# Patient Record
Sex: Female | Born: 1950 | Race: White | Hispanic: No | Marital: Married | Smoking: Current every day smoker
Health system: Southern US, Community
[De-identification: ages and names within clinical notes are randomized; demographics above are authoritative.]

## PROBLEM LIST (undated history)

## (undated) DIAGNOSIS — M1990 Unspecified osteoarthritis, unspecified site: Secondary | ICD-10-CM

## (undated) DIAGNOSIS — E119 Type 2 diabetes mellitus without complications: Secondary | ICD-10-CM

## (undated) DIAGNOSIS — K219 Gastro-esophageal reflux disease without esophagitis: Secondary | ICD-10-CM

## (undated) DIAGNOSIS — C801 Malignant (primary) neoplasm, unspecified: Secondary | ICD-10-CM

## (undated) DIAGNOSIS — R011 Cardiac murmur, unspecified: Secondary | ICD-10-CM

## (undated) DIAGNOSIS — I10 Essential (primary) hypertension: Secondary | ICD-10-CM

## (undated) DIAGNOSIS — Z87440 Personal history of urinary (tract) infections: Secondary | ICD-10-CM

## (undated) DIAGNOSIS — J189 Pneumonia, unspecified organism: Secondary | ICD-10-CM

## (undated) DIAGNOSIS — K759 Inflammatory liver disease, unspecified: Secondary | ICD-10-CM

## (undated) HISTORY — PX: TOTAL HIP ARTHROPLASTY: SHX124

## (undated) HISTORY — DX: Essential (primary) hypertension: I10

## (undated) HISTORY — PX: COLON SURGERY: SHX602

---

## 1995-01-29 ENCOUNTER — Encounter: Payer: Self-pay | Admitting: Internal Medicine

## 2003-05-29 ENCOUNTER — Emergency Department (HOSPITAL_COMMUNITY): Admission: AD | Admit: 2003-05-29 | Discharge: 2003-05-29 | Payer: Self-pay | Admitting: Emergency Medicine

## 2003-05-29 ENCOUNTER — Encounter: Payer: Self-pay | Admitting: Emergency Medicine

## 2003-11-26 ENCOUNTER — Encounter: Admission: RE | Admit: 2003-11-26 | Discharge: 2003-11-26 | Payer: Self-pay | Admitting: Family Medicine

## 2003-12-19 ENCOUNTER — Encounter: Payer: Self-pay | Admitting: Nurse Practitioner

## 2004-05-30 ENCOUNTER — Encounter: Admission: RE | Admit: 2004-05-30 | Discharge: 2004-05-30 | Payer: Self-pay | Admitting: Family Medicine

## 2004-06-17 ENCOUNTER — Ambulatory Visit (HOSPITAL_COMMUNITY): Admission: RE | Admit: 2004-06-17 | Discharge: 2004-06-17 | Payer: Self-pay | Admitting: General Surgery

## 2004-06-25 ENCOUNTER — Encounter: Admission: RE | Admit: 2004-06-25 | Discharge: 2004-06-25 | Payer: Self-pay | Admitting: Family Medicine

## 2004-06-27 ENCOUNTER — Encounter: Admission: RE | Admit: 2004-06-27 | Discharge: 2004-06-27 | Payer: Self-pay | Admitting: Family Medicine

## 2004-08-09 ENCOUNTER — Emergency Department (HOSPITAL_COMMUNITY): Admission: EM | Admit: 2004-08-09 | Discharge: 2004-08-09 | Payer: Self-pay | Admitting: Podiatry

## 2004-08-12 ENCOUNTER — Encounter: Payer: Self-pay | Admitting: Nurse Practitioner

## 2004-08-13 ENCOUNTER — Encounter: Admission: RE | Admit: 2004-08-13 | Discharge: 2004-08-13 | Payer: Self-pay | Admitting: Orthopedic Surgery

## 2004-10-21 ENCOUNTER — Inpatient Hospital Stay (HOSPITAL_COMMUNITY): Admission: RE | Admit: 2004-10-21 | Discharge: 2004-10-25 | Payer: Self-pay | Admitting: Orthopedic Surgery

## 2004-12-29 ENCOUNTER — Encounter: Admission: RE | Admit: 2004-12-29 | Discharge: 2004-12-29 | Payer: Self-pay | Admitting: Family Medicine

## 2005-05-18 ENCOUNTER — Ambulatory Visit: Payer: Self-pay | Admitting: Internal Medicine

## 2005-07-17 ENCOUNTER — Ambulatory Visit: Payer: Self-pay | Admitting: Internal Medicine

## 2005-07-23 ENCOUNTER — Ambulatory Visit: Payer: Self-pay | Admitting: Internal Medicine

## 2005-08-11 ENCOUNTER — Ambulatory Visit: Payer: Self-pay | Admitting: Internal Medicine

## 2005-08-13 ENCOUNTER — Ambulatory Visit: Payer: Self-pay | Admitting: Internal Medicine

## 2005-08-21 ENCOUNTER — Ambulatory Visit: Payer: Self-pay | Admitting: Internal Medicine

## 2005-10-05 ENCOUNTER — Ambulatory Visit: Payer: Self-pay | Admitting: Internal Medicine

## 2005-11-04 ENCOUNTER — Ambulatory Visit: Payer: Self-pay | Admitting: Internal Medicine

## 2005-11-30 ENCOUNTER — Ambulatory Visit: Payer: Self-pay | Admitting: Internal Medicine

## 2006-07-22 ENCOUNTER — Encounter: Admission: RE | Admit: 2006-07-22 | Discharge: 2006-07-22 | Payer: Self-pay | Admitting: Orthopedic Surgery

## 2006-08-18 ENCOUNTER — Encounter: Admission: RE | Admit: 2006-08-18 | Discharge: 2006-08-18 | Payer: Self-pay | Admitting: Orthopedic Surgery

## 2006-12-17 ENCOUNTER — Ambulatory Visit: Payer: Self-pay | Admitting: Internal Medicine

## 2007-01-04 ENCOUNTER — Ambulatory Visit (HOSPITAL_COMMUNITY): Admission: RE | Admit: 2007-01-04 | Discharge: 2007-01-04 | Payer: Self-pay | Admitting: Internal Medicine

## 2007-01-04 ENCOUNTER — Encounter: Payer: Self-pay | Admitting: Internal Medicine

## 2007-02-07 ENCOUNTER — Ambulatory Visit: Payer: Self-pay | Admitting: Internal Medicine

## 2007-10-11 ENCOUNTER — Other Ambulatory Visit: Admission: RE | Admit: 2007-10-11 | Discharge: 2007-10-11 | Payer: Self-pay | Admitting: Family Medicine

## 2007-10-28 DIAGNOSIS — K589 Irritable bowel syndrome without diarrhea: Secondary | ICD-10-CM

## 2007-10-28 DIAGNOSIS — M129 Arthropathy, unspecified: Secondary | ICD-10-CM | POA: Insufficient documentation

## 2007-10-28 DIAGNOSIS — M1990 Unspecified osteoarthritis, unspecified site: Secondary | ICD-10-CM | POA: Insufficient documentation

## 2007-10-28 DIAGNOSIS — E785 Hyperlipidemia, unspecified: Secondary | ICD-10-CM | POA: Insufficient documentation

## 2007-10-28 DIAGNOSIS — Z860100 Personal history of colon polyps, unspecified: Secondary | ICD-10-CM | POA: Insufficient documentation

## 2007-10-28 DIAGNOSIS — K219 Gastro-esophageal reflux disease without esophagitis: Secondary | ICD-10-CM | POA: Insufficient documentation

## 2007-10-28 DIAGNOSIS — K449 Diaphragmatic hernia without obstruction or gangrene: Secondary | ICD-10-CM | POA: Insufficient documentation

## 2007-12-16 ENCOUNTER — Encounter: Admission: RE | Admit: 2007-12-16 | Discharge: 2007-12-16 | Payer: Self-pay | Admitting: Family Medicine

## 2009-05-10 ENCOUNTER — Emergency Department (HOSPITAL_COMMUNITY): Admission: EM | Admit: 2009-05-10 | Discharge: 2009-05-10 | Payer: Self-pay | Admitting: Emergency Medicine

## 2010-01-20 ENCOUNTER — Telehealth: Payer: Self-pay | Admitting: Internal Medicine

## 2010-01-21 ENCOUNTER — Ambulatory Visit: Payer: Self-pay | Admitting: Internal Medicine

## 2010-01-21 DIAGNOSIS — R1032 Left lower quadrant pain: Secondary | ICD-10-CM | POA: Insufficient documentation

## 2010-01-21 DIAGNOSIS — R109 Unspecified abdominal pain: Secondary | ICD-10-CM | POA: Insufficient documentation

## 2010-01-21 DIAGNOSIS — Z87448 Personal history of other diseases of urinary system: Secondary | ICD-10-CM | POA: Insufficient documentation

## 2010-01-21 DIAGNOSIS — R198 Other specified symptoms and signs involving the digestive system and abdomen: Secondary | ICD-10-CM | POA: Insufficient documentation

## 2010-01-21 LAB — CONVERTED CEMR LAB
Basophils Absolute: 0 10*3/uL (ref 0.0–0.1)
Calcium: 9 mg/dL (ref 8.4–10.5)
Eosinophils Absolute: 0.1 10*3/uL (ref 0.0–0.7)
GFR calc non Af Amer: 135.39 mL/min (ref >60)
HCT: 39.7 % (ref 36.0–46.0)
Lymphs Abs: 2.8 10*3/uL (ref 0.7–4.0)
MCV: 89.6 fL (ref 78.0–100.0)
Monocytes Absolute: 1.2 10*3/uL — ABNORMAL HIGH (ref 0.1–1.0)
Platelets: 303 10*3/uL (ref 150.0–400.0)
RDW: 12.6 % (ref 11.5–14.6)
Sodium: 140 meq/L (ref 135–145)

## 2010-01-22 ENCOUNTER — Ambulatory Visit: Payer: Self-pay | Admitting: Cardiology

## 2010-01-22 ENCOUNTER — Telehealth: Payer: Self-pay | Admitting: Nurse Practitioner

## 2010-01-23 ENCOUNTER — Telehealth: Payer: Self-pay | Admitting: Nurse Practitioner

## 2010-01-23 ENCOUNTER — Inpatient Hospital Stay (HOSPITAL_COMMUNITY): Admission: EM | Admit: 2010-01-23 | Discharge: 2010-01-26 | Payer: Self-pay | Admitting: Gastroenterology

## 2010-01-24 ENCOUNTER — Encounter: Payer: Self-pay | Admitting: Gastroenterology

## 2010-01-25 ENCOUNTER — Ambulatory Visit: Payer: Self-pay | Admitting: Gastroenterology

## 2010-01-27 ENCOUNTER — Telehealth: Payer: Self-pay | Admitting: Internal Medicine

## 2010-01-27 ENCOUNTER — Encounter: Payer: Self-pay | Admitting: Internal Medicine

## 2010-01-27 DIAGNOSIS — K5732 Diverticulitis of large intestine without perforation or abscess without bleeding: Secondary | ICD-10-CM

## 2010-01-30 ENCOUNTER — Ambulatory Visit: Payer: Self-pay | Admitting: Internal Medicine

## 2010-01-30 ENCOUNTER — Ambulatory Visit (HOSPITAL_COMMUNITY): Admission: RE | Admit: 2010-01-30 | Discharge: 2010-01-30 | Payer: Self-pay | Admitting: Internal Medicine

## 2010-01-30 ENCOUNTER — Encounter (INDEPENDENT_AMBULATORY_CARE_PROVIDER_SITE_OTHER): Payer: Self-pay | Admitting: *Deleted

## 2010-01-30 ENCOUNTER — Inpatient Hospital Stay (HOSPITAL_COMMUNITY): Admission: AD | Admit: 2010-01-30 | Discharge: 2010-02-05 | Payer: Self-pay | Admitting: Gastroenterology

## 2010-01-30 DIAGNOSIS — K5730 Diverticulosis of large intestine without perforation or abscess without bleeding: Secondary | ICD-10-CM | POA: Insufficient documentation

## 2010-01-30 DIAGNOSIS — I10 Essential (primary) hypertension: Secondary | ICD-10-CM | POA: Insufficient documentation

## 2010-02-02 ENCOUNTER — Encounter (INDEPENDENT_AMBULATORY_CARE_PROVIDER_SITE_OTHER): Payer: Self-pay | Admitting: *Deleted

## 2010-02-05 ENCOUNTER — Telehealth (INDEPENDENT_AMBULATORY_CARE_PROVIDER_SITE_OTHER): Payer: Self-pay | Admitting: *Deleted

## 2010-02-12 ENCOUNTER — Encounter: Payer: Self-pay | Admitting: Physician Assistant

## 2010-02-12 ENCOUNTER — Telehealth: Payer: Self-pay | Admitting: Physician Assistant

## 2010-02-19 ENCOUNTER — Ambulatory Visit: Payer: Self-pay | Admitting: Internal Medicine

## 2010-02-19 ENCOUNTER — Ambulatory Visit: Payer: Self-pay | Admitting: Cardiovascular Disease

## 2010-02-20 ENCOUNTER — Telehealth (INDEPENDENT_AMBULATORY_CARE_PROVIDER_SITE_OTHER): Payer: Self-pay | Admitting: *Deleted

## 2010-02-25 ENCOUNTER — Ambulatory Visit (HOSPITAL_BASED_OUTPATIENT_CLINIC_OR_DEPARTMENT_OTHER): Admission: RE | Admit: 2010-02-25 | Discharge: 2010-02-25 | Payer: Self-pay | Admitting: Urology

## 2010-02-28 ENCOUNTER — Encounter: Payer: Self-pay | Admitting: Internal Medicine

## 2010-03-03 ENCOUNTER — Telehealth: Payer: Self-pay | Admitting: Internal Medicine

## 2010-03-11 ENCOUNTER — Encounter: Payer: Self-pay | Admitting: Internal Medicine

## 2010-03-11 ENCOUNTER — Telehealth: Payer: Self-pay | Admitting: Internal Medicine

## 2010-03-18 ENCOUNTER — Encounter: Admission: RE | Admit: 2010-03-18 | Discharge: 2010-03-18 | Payer: Self-pay | Admitting: Surgery

## 2010-04-07 ENCOUNTER — Inpatient Hospital Stay (HOSPITAL_COMMUNITY): Admission: RE | Admit: 2010-04-07 | Discharge: 2010-04-14 | Payer: Self-pay | Admitting: Surgery

## 2010-04-07 ENCOUNTER — Encounter (INDEPENDENT_AMBULATORY_CARE_PROVIDER_SITE_OTHER): Payer: Self-pay | Admitting: Surgery

## 2010-05-12 ENCOUNTER — Encounter: Payer: Self-pay | Admitting: Internal Medicine

## 2010-05-30 ENCOUNTER — Encounter: Payer: Self-pay | Admitting: Internal Medicine

## 2010-06-06 ENCOUNTER — Emergency Department (HOSPITAL_COMMUNITY): Admission: EM | Admit: 2010-06-06 | Discharge: 2010-06-06 | Payer: Self-pay | Admitting: Emergency Medicine

## 2010-12-14 ENCOUNTER — Encounter: Payer: Self-pay | Admitting: Family Medicine

## 2010-12-23 NOTE — Miscellaneous (Signed)
Summary: med. orders  Clinical Lists Changes  Medications: Added new medication of AUGMENTIN 875-125 MG TABS (AMOXICILLIN-POT CLAVULANATE) Take 1 p.o.b.i.d. x14 days - Signed Added new medication of METRONIDAZOLE 500 MG TABS (METRONIDAZOLE) Take 1 p.o. two times a day x 2 weeks-use what you have on hand first(7) - Signed Rx of AUGMENTIN 875-125 MG TABS (AMOXICILLIN-POT CLAVULANATE) Take 1 p.o.b.i.d. x14 days;  #28 x 0;  Signed;  Entered by: Teryl Lucy RN;  Authorized by: Sammuel Cooper PA-c;  Method used: Electronically to [facility] [phone]*, [address] [place], [place]  [address], Ph: [phone], Fax: [phone] Rx of METRONIDAZOLE 500 MG TABS (METRONIDAZOLE) Take 1 p.o. two times a day x 2 weeks-use what you have on hand first(7);  #21 x 0;  Signed;  Entered by: Teryl Lucy RN;  Authorized by: Sammuel Cooper PA-c;  Method used: Electronically to [facility] [phone]*, [address], [place], [place]  [address], Ph: [phone], Fax: [phone]    Prescriptions: METRONIDAZOLE 500 MG TABS (METRONIDAZOLE) Take 1 p.o. two times a day x 2 weeks-use what you have on hand first(7)  #21 x 0   Entered by:   Teryl Lucy RN   Authorized by:   Sammuel Cooper PA-c   Signed by:   Teryl Lucy RN on 02/12/2010   Method used:   Electronically to        [facility] [phone]* (retail)       [address] [place], [place]  [address]       Ph: [phone]       Fax: [phone]   RxID:   [id number] AUGMENTIN 875-125 MG TABS (AMOXICILLIN-POT CLAVULANATE) Take 1 p.o.b.i.d. x14 days  #28 x 0   Entered by:   Teryl Lucy RN   Authorized by:   Sammuel Cooper PA-c   Signed by:   Teryl Lucy RN on 02/12/2010   Method used:   Electronically to        [facility] [phone]* (retail)       [address] [place], [place]  [address]       Ph: [phone]       Fax: [phone]   RxID:   [id number]

## 2010-12-23 NOTE — Consult Note (Signed)
[facility]   Imported By: Sherian Rein 02/13/2010 08:09:27  _____________________________________________________________________  External Attachment:    Type:   Image     Comment:   External Document

## 2010-12-23 NOTE — Letter (Signed)
Summary: [employer] Certification Notification   Imported By: Roderic Ovens 04/23/2010 14:41:18  _____________________________________________________________________  External Attachment:    Type:   Image     Comment:   External Document

## 2010-12-23 NOTE — Procedures (Signed)
Summary: [facility] EGD   EGD  Procedure date:  01/04/2007  Findings:      Location: [facility]   Patient Name: Jane [name], Jane [name] MRN: [id number] Procedure Procedures: Panendoscopy (EGD) CPT: 43235.    with esophageal dilation. CPT: G9296129.  Personnel: Endoscopist: Wilhemina Bonito. Marina Goodell, MD.  Referred By: Henrine Screws, MD.  Exam Location: Exam performed in Endoscopy Suite.  Patient Consent: Procedure, Alternatives, Risks and Benefits discussed, consent obtained, from patient. Consent was obtained by the RN.  Indications  Therapeutics: Reason for exam: Esophageal dilation.  Symptoms: Dysphagia. Reflux symptoms  History  Current Medications: Patient is not currently taking Coumadin.  Pre-Exam Physical: Performed Jan 04, 2007  Cardio-pulmonary exam, HEENT exam, Abdominal exam, Mental status exam WNL.  Comments: Pt. history reviewed/updated, physical exam performed prior to initiation of sedation?yes Exam Exam Info: Maximum depth of insertion Duodenum, intended Duodenum. Patient position: on left side. Vocal cords visualized. Gastric retroflexion performed. Images taken. ASA Classification: II. Tolerance: excellent.  Sedation Meds: Patient assessed and found to be appropriate for moderate (conscious) sedation. Fentanyl 100 mcg. given IV. Versed 8 mg. given IV. Cetacaine Spray 2 sprays given aerosolized.  Monitoring: BP and pulse monitoring done. Oximetry used. Supplemental O2 given  Fluoroscopy: Fluoroscopy was used.  Findings STRICTURE / STENOSIS: Stricture in Distal Esophagus.  Constriction: partial. Etiology: benign due to reflux. 37 cm from mouth. Lumen diameter is 15 mm. ICD9: Esophageal Stricture: 530.3. Comment: No Barrett's or Active esophagitis.  - Dilation: Distal Esophagus. Procedure was performed under Fluoroscopy. Wire Guided/Savary (Wilson-Cook) dilator used, Diameter: 18 mm, No Resistance, No Heme present on extraction. 1  total dilators used. Patient  tolerance excellent.    Comments: Small H/H . Otherwise normal EGD to D3 Assessment  Diagnoses: 530.81: GERD.  530.3: Esophageal Stricture. s/p dilation.   Events  Unplanned Intervention: No unplanned interventions were required.  Unplanned Events: There were no complications. Plans Instructions: Nothing to eat or drink for 2 hrs.  Clear or full liquids: 2 hrs. Resume previous diet: am.  Medication(s): Continue current medications.  Patient Education: Patient given standard instructions for: Reflux.  Disposition: After procedure patient sent to recovery. After recovery patient sent home.  Scheduling: Follow-up prn.   cc:  Henrine Screws, MD  This report was created from the original endoscopy report, which was reviewed and signed by the above listed endoscopist.

## 2010-12-23 NOTE — Progress Notes (Signed)
Summary: CT ABD/PELVIS CALL REPORT  Phone Note From Other Clinic   Caller: [facility] Call For: Willette Cluster, NP Summary of Call: Call Report for patient CT abd/pelvis with contrast completed today: Edema and ill defined fluid within the sigmoid mesocolon with adjacent colonic wall thickening. Findings likely relate to moderated diverticulitis with phlegmon. No drainable abcess. #2 Moderate left hydroureteronephrosis secondary to likely inflammatory process. Given the ureteric obstrution, colon carcinoma should also be considered but is felt to be less likely. Initial call taken by: Hortense Ramal CMA Duncan Dull),  January 22, 2010 3:27 PM  Follow-up for Phone Call        I have called and spoken to Willette Cluster, NP and advised her of the report just recieved. She has already given the patient cipro and flagyl emperically. However, she would like [place] to call patient and find out how she is feeling.  Follow-up by: Hortense Ramal CMA Duncan Dull),  January 22, 2010 3:32 PM  Additional Follow-up for Phone Call Additional follow up Details #1::        Patient states that she is still having left sided abdominal discomfort which has really not changed in intesity and complains that she still has not really had a bowel movement. She states that she has had no recorded fever but has had some chills.There has been no nausea or vomiting. She has began taking her cipro and flagyl as written. I have advised Willette Cluster, NP of the above conversation and she will talk to the patient first thing tommorrow morning (at her work number) to see how she is feeling at that time. Additional Follow-up by: Hortense Ramal CMA Duncan Dull),  January 22, 2010 3:42 PM    Additional Follow-up for Phone Call Additional follow up Details #2::    Called patient at 9:00am, she was at work.  Her stabbing pain has subsided but still very tender in LLQ.  No shaking chills. She is tolerating clears. Will work her in to see Dr. Marina Goodell  tomorrow, or early next week. In meantime, she will call us ASAP for severe pain, fevers, chills.  Follow-up by: Willette Cluster NP,  January 23, 2010 9:22 AM

## 2010-12-23 NOTE — Progress Notes (Signed)
  Phone Note Call from Patient   Caller: Patient Call For: Jane [name], CMA Summary of Call: She was feeling better this morning but called back this afternoon with recurrent LLQ pain, severe. She will be admitted to the hospital for inpatient management of diverticulitis. I spoke with Dr. Marina Goodell, patient's primary gastroenterologist, patient will be admitted. Hospitalist will admit patient but we will be following.  Initial call taken by: Willette Cluster NP,  January 23, 2010 6:23 PM

## 2010-12-23 NOTE — Progress Notes (Signed)
Summary: Follow up  Phone Note Call from Patient   Summary of Call: Pt. called to say DrBborden has scheduled her to put a stent on 02/25/10. Dr.Martin is out of town all week so they will address sooner appt. with him when he returns.Current appt. is 4/8. Initial call taken by: Teryl Lucy RN,  February 20, 2010 12:14 PM  Follow-up for Phone Call        ok. thanks for the follow up Follow-up by: Hilarie Fredrickson MD,  February 20, 2010 12:19 PM

## 2010-12-23 NOTE — Letter (Signed)
Summary: [facility]   Imported By: Sherian Rein 03/17/2010 07:59:18  _____________________________________________________________________  External Attachment:    Type:   Image     Comment:   External Document

## 2010-12-23 NOTE — Progress Notes (Signed)
Summary: Needs appt for today  Phone Note Call from Patient Call back at Home Phone [phone]   Call For: Dr Marina Goodell Reason for Call: Talk to Nurse Summary of Call: Needs to get in today. Was discharged from hospital yesterday. Initial call taken by: Leanor Kail [facility],  January 27, 2010 8:50 AM  Follow-up for Phone Call        Pt. discharged from hospital yesterday with diverticulitis Says.Dr. did not want to discharge her but she insisted because she couldn't rest there. Asking for f/u appt. for today as she said she promised the dr. she would call as he said she probably will need f/u CT on Wed. or Thursday. Follow-up by: Teryl Lucy RN,  January 27, 2010 9:08 AM  Additional Follow-up for Phone Call Additional follow up Details #1::        have her see Amy today. I am supervising Additional Follow-up by: Hilarie Fredrickson MD,  January 27, 2010 9:12 AM    Additional Follow-up for Phone Call Additional follow up Details #2::     Given appt. with Amy for 2 pm today. Follow-up by: Teryl Lucy RN,  January 27, 2010 9:19 AM   Appended Document: Needs appt for today I SPOKE WITH Amaira,SHE IS FEELING A LITTLE BETTER EACH DAY,GRADUALLY ADVANCING HER DIET,USING PAIN MEDICINE ONCE OR TWICE DAILY."PAIN IS A WHOLE LOT BETTER THAN IT WAS". SHE DOES NOT NEED OFFICE VISIT TODAY, WILL SCHEDULE FOR FOLLOW UP CT ON 3/10 AND OFFICE VISIT WITH AMY THAT AFTERNOON. SHE KNOWS TO CALL IN THE INTERIM FOR PROBLEMS.  Appended Document: Needs appt for today ok

## 2010-12-23 NOTE — Letter (Signed)
Summary: [facility]  [facility]   Imported By: Lester Van Zandt 01/28/2010 10:34:58  _____________________________________________________________________  External Attachment:    Type:   Image     Comment:   External Document

## 2010-12-23 NOTE — Letter (Signed)
Summary: authorization  authorization   Imported By: Kassie Mends 02/21/2010 08:45:56  _____________________________________________________________________  External Attachment:    Type:   Image     Comment:   External Document

## 2010-12-23 NOTE — Procedures (Signed)
Summary: Lajean Silvius Buccini MD  Colon/Robert V Buccini MD   Imported By: Lester Batesland 01/28/2010 10:32:14  _____________________________________________________________________  External Attachment:    Type:   Image     Comment:   External Document

## 2010-12-23 NOTE — Letter (Signed)
Summary: [facility]   Imported By: Sherian Rein 06/11/2010 13:23:15  _____________________________________________________________________  External Attachment:    Type:   Image     Comment:   External Document

## 2010-12-23 NOTE — Progress Notes (Signed)
Summary: FMLA paperwork for completion  FMLA paperwork for completion. Forwarded to [employer]. Dena Chavis  February 05, 2010 9:14 AM

## 2010-12-23 NOTE — Assessment & Plan Note (Signed)
Summary: Post hospital - Diverticulitis   History of Present Illness Visit Type: Follow-up Visit Primary GI MD: Yancey Flemings MD Primary Provider: Lanell Persons, MD  Requesting Provider: n/a Chief Complaint: Follow up for abdominal pain, pt still c/o abdominal pain taking Cipro was prescribed by Gunnar Fusi when pt was in hospital History of Present Illness:   60 year old female with hypertension, hyperlipidemia, obesity, osteoarthritis, and gastroesophageal reflux disease complicated by peptic stricture. She has been followed recently for complicated diverticulitis. Her history dates back approximately 5 weeks when she developed lower abdominal pain. She was seen about one week later and diagnosed with complicated diverticulitis as evidenced by the CT scan revealing sigmoid phlegmon with left-sided ureteral obstruction. She was treated as an outpatient with antibiotics and pain medication, but due to increasing pain, had a short hospital stay. She was subsequently rehospitalized March 10-16, 2011 with ongoing pain and an unchanged CT scan. She was seen in consultation by urology as well as general surgery. After one week of IV antibiotics she was feeling somewhat better and was discharged home. Due to medication confusion, based on discharge instructions sheet, she was on no antibiotic for about one week. However one week ago she was placed on Augmentin and Flagyl. She complained of diarrhea this past weekend and Cipro was exchanged for Augmentin. She continues the medications at this time. She states that she is no better. She still has significant pain. She is due to see the urologist this afternoon. She did undergo complete colonoscopy with Dr. Matthias Hughs in 2005. This revealed left-sided diverticulosis and diminutive polyps which were removed and found to be hyperplastic.   GI Review of Systems    Reports abdominal pain, bloating, and  nausea.     Location of  Abdominal pain: LLQ.    Denies acid reflux,  belching, chest pain, dysphagia with liquids, dysphagia with solids, heartburn, loss of appetite, vomiting, vomiting blood, weight loss, and  weight gain.        Denies anal fissure, black tarry stools, change in bowel habit, constipation, diarrhea, diverticulosis, fecal incontinence, heme positive stool, hemorrhoids, irritable bowel syndrome, jaundice, light color stool, liver problems, rectal bleeding, and  rectal pain.    Current Medications (verified): 1)  Prilosec 20 Mg  Cpdr (Omeprazole) .... One Tablet By Mouth Once Daily 2)  Tylenol Extra Strength 500 Mg Tabs (Acetaminophen) .... Two Tablets By Mouth Once Daily 3)  Hydrocodone-Acetaminophen 5-325 Mg Tabs (Hydrocodone-Acetaminophen) .... Take 1 Tablet By Mouth Every 6 Hours For Pain 4)  Metronidazole 500 Mg Tabs (Metronidazole) .... Take 1 P.o. Two Times A Day X 2 Weeks-Use What You Have On Hand First(7) 5)  Florastor 250 Mg Caps (Saccharomyces Boulardii) .Marland Kitchen.. 1 By Mouth Two Times A Day  Allergies (verified): 1)  ! Codeine  Past History:  Past Medical History: Last updated: 02/14/2010 Colonic polyps, hx of DIVERTICULITIS 3/11-HOSPITALIZED Hepatitis A, hx of Smoker Hyperlipidemia Osteoarthritis Diabetes: Controlled by diet and weight loss  hyperplastic polyp 2005  Past Surgical History: Last updated: 10/28/2007 Appendectomy-1976 Caesarean section x 12-1970 & 1973 Hip replacement-09/2004 [id number]  Family History: Last updated: 01/21/2010 No FH of Colon Cancer: Stomach Cancer: PGM Family History of Heart Disease: Paternal Uncles   Social History: Last updated: 01/21/2010 Occupation: Sales Married 2 childern Patient currently smokes.  Alcohol Use - yes:3-4 a week  Daily Caffeine Use: 2 daily  Illicit Drug Use - no  Review of Systems       The patient complains of fatigue.  The patient  denies allergy/sinus, anemia, anxiety-new, arthritis/joint pain, back pain, blood in urine, breast changes/lumps, change in  vision, confusion, cough, coughing up blood, depression-new, fainting, fever, headaches-new, hearing problems, heart murmur, heart rhythm changes, itching, menstrual pain, muscle pains/cramps, night sweats, nosebleeds, pregnancy symptoms, shortness of breath, skin rash, sleeping problems, sore throat, swelling of feet/legs, swollen lymph glands, thirst - excessive , urination - excessive , urination changes/pain, urine leakage, vision changes, and voice change.    Vital Signs:  Patient profile:   60 year old female Height:      62 inches Weight:      166 pounds BMI:     30.47 BSA:     1.77 Pulse rate:   80 / minute Pulse rhythm:   regular BP sitting:   120 / 80  (left arm)  Vitals Entered By: Merri Ray CMA Duncan Dull) (February 19, 2010 10:54 AM)  Physical Exam  General:  Well developed, well nourished, no acute distress. Head:  Normocephalic and atraumatic. Eyes:  PERRLA, no icterus. Ears:  Normal auditory acuity. Nose:  No deformity, discharge,  or lesions. Mouth:  No deformity or lesions, dentition normal. Neck:  Supple; no masses or thyromegaly. Lungs:  Clear throughout to auscultation. Heart:  Regular rate and rhythm; no murmurs, rubs,  or bruits. Abdomen:  obese and soft with good bowel sounds. There is tenderness with rebound in the left lower quadrant. No obvious mass. No hernia. Msk:  Symmetrical with no gross deformities. Normal posture. Pulses:  Normal pulses noted. Extremities:  No clubbing, cyanosis, edema or deformities noted. Neurologic:  Alert and  oriented x4;  grossly normal neurologically. Skin:  Intact without significant lesions or rashes. Psych:  Alert and cooperative. Normal mood and affect.   Impression & Recommendations:  Problem # 1:  DIVERTICULITIS OF COLON (ICD-562.11) Complicated and persisting diverticulitis of the sigmoid colon with associated phlegmon and left ureteral obstruction. She continues with significant symptoms and an abnormal physical  exam despite ongoing antibiotics (though there was a one week interruption as described). The patient had multiple questions regarding diverticular disease, or problems with diverticulitis, and possible therapies moving forward. We spent about 30 minutes discussing these issues.  Plan: #1. Refill ciprofloxacin 500 mg p.o. b.i.d. x2 weeks #2. Refill metronidazole 500 mg p.o. b.i.d. x2 weeks #3. Refill hydrocodone #4. Contrast-enhanced CT scan of the abdomen and pelvis later today #5. Keep urology appointment today. She may need a ureteral stent #6. She has a scheduled surgical appointment for April 8. We may need to adjust this pending upon the results of the CT scan. #7. Low-residue diet  Problem # 2:  GERD (ICD-530.81) asymptomatic on Prilosec. No recurrent dysphagia. Recommended 2 continue Prilosec. Repeat esophageal dilation p.r.n. recurrent dysphagia.  Other Orders: CT Abdomen/Pelvis w & w/o Contrast (CT A/P w&w/o Cont)  Patient Instructions: 1)  CT Abdomen/Pelvis with and w/o contrast [place] CT today 2:00 pm.   2)  Contrast given to patient in office with instructions and times to drink. 3)  Rx. for Cipro, Metronidazole and Hydrocodone sent to pharmacy for patient to pick up. 4)  The medication list was reviewed and reconciled.  All changed / newly prescribed medications were explained.  A complete medication list was provided to the patient / caregiver. 5)  Patient was not given a copy due to her not being able to wait.  Had another appt. [facility]  February 19, 2010 11:43 AM 6)  Copy: Dr. Crecencio Mc, Dr. Luretha Murphy, Dr. Henrine Screws  Prescriptions: CIPRO 500 MG TABS (CIPROFLOXACIN HCL) 1 tablet by mouth two times a day  #28 x 0   Entered by:   Milford Cage NCMA   Authorized by:   Hilarie Fredrickson MD   Signed by:   Milford Cage NCMA on 02/19/2010   Method used:   Printed then faxed to ...       [address] [phone]* (retail)       [address] [place],  [place]  [address]       Ph: [phone]       Fax: [phone]   RxID:   [id number] HYDROCODONE-ACETAMINOPHEN 5-325 MG TABS (HYDROCODONE-ACETAMINOPHEN) Take 1 tablet by mouth every 6 hours for pain  #60 x 0   Entered by:   Milford Cage NCMA   Authorized by:   Hilarie Fredrickson MD   Signed by:   Milford Cage NCMA on 02/19/2010   Method used:   Printed then faxed to ...       [address] [phone]* (retail)       [address] [place], [place]  [address]       Ph: [phone]       Fax: [phone]   RxID:   [id number] METRONIDAZOLE 500 MG TABS (METRONIDAZOLE) Take 1 p.o. two times a day x 2 weeks-use what you have on hand first(7)  #21 x 0   Entered by:   Milford Cage NCMA   Authorized by:   Hilarie Fredrickson MD   Signed by:   Milford Cage NCMA on 02/19/2010   Method used:   Electronically to        [facility] [phone]* (retail)       [address] [place], [place]  [address]       Ph: [phone]       Fax: [phone]   RxID:   [id number]

## 2010-12-23 NOTE — Letter (Signed)
Summary: [facility]   Imported By: Lester Tynan 05/21/2010 10:24:54  _____________________________________________________________________  External Attachment:    Type:   Image     Comment:   External Document

## 2010-12-23 NOTE — Miscellaneous (Signed)
Summary: CT scan  Clinical Lists Changes  Problems: Added new problem of DIVERTICULITIS OF COLON (ICD-562.11) Orders: Added new Referral order of CT Abdomen/Pelvis with Contrast (CT Abd/Pelvis w/con) - Signed

## 2010-12-23 NOTE — Progress Notes (Signed)
Summary: Triage  Phone Note Call from Patient Call back at Work Phone [phone]   Caller: Patient Call For: Dr. Marina Goodell Reason for Call: Talk to Nurse Summary of Call: Pt went to her MD over the weekend. Having alot of "gas pains" and wants a sooner appt. than 02-27-10 Initial call taken by: Karna Christmas,  January 20, 2010 8:45 AM  Follow-up for Phone Call          Message left for pt. to call back   Teryl Lucy RN  January 20, 2010 11:59 AM  Pt. started having gas like abd. pain  last Wednesday.Her wt. loss physician told her to take Miralax q.2 hrs.x 24 hrs. over the week. end which she did. Bowels are moving but in small amts. and abd. discomfort persists.Given appt. with NP for tomorrow. Follow-up by: Teryl Lucy RN,  January 20, 2010 2:33 PM

## 2010-12-23 NOTE — Miscellaneous (Signed)
Summary: Vicodin order  Clinical Lists Changes  Medications: Added new medication of VICODIN 5-500 MG TABS (HYDROCODONE-ACETAMINOPHEN) Take 1-2 every 4-6 hrs. as needed for pain - Signed Rx of VICODIN 5-500 MG TABS (HYDROCODONE-ACETAMINOPHEN) Take 1-2 every 4-6 hrs. as needed for pain;  #40 x 0;  Signed;  Entered by: Teryl Lucy RN;  Authorized by: Hilarie Fredrickson MD;  Method used: Printed then faxed to [facility] [address] [place], [place]  [address], Ph: [phone], Fax: [phone]    Prescriptions: VICODIN 5-500 MG TABS (HYDROCODONE-ACETAMINOPHEN) Take 1-2 every 4-6 hrs. as needed for pain  #40 x 0   Entered by:   Teryl Lucy RN   Authorized by:   Hilarie Fredrickson MD   Signed by:   Teryl Lucy RN on 03/11/2010   Method used:   Printed then faxed to ...       [address] [phone]* (retail)       [address] [place], [place]  [address]       Ph: [phone]       Fax: [phone]   RxID:   [id number]

## 2010-12-23 NOTE — Letter (Signed)
[facility]   Imported By: Sherian Rein 03/05/2010 10:11:12  _____________________________________________________________________  External Attachment:    Type:   Image     Comment:   External Document

## 2010-12-23 NOTE — Progress Notes (Signed)
Summary: Question about antibiotics.  Phone Note Call from Patient Call back at Home Phone [phone]   Call For: Dr Marina Goodell Reason for Call: Talk to Nurse Summary of Call: Should she continue the antibiotics she was taking prior to being admitted in the hospital Initial call taken by: Leanor Kail [facility],  February 12, 2010 9:16 AM  Follow-up for Phone Call        Ssays her discharge instructions does not say to take any antibiotics and the past 3 days she has had had some mild intermittent  lower abd. pain.Will you please clarify antibiotic orders. Follow-up by: Teryl Lucy RN,  February 12, 2010 9:37 AM  Additional Follow-up for Phone Call Additional follow up Details #1::        SSHE SHOULD HAVE PRESRIPTIONS FOR AUGMENTIN 875 TWICE DAILY AND FLAGYL 500 MG TWICE DAILY WHICH WERE PRINTED AT THE HOSPITAL AND PLACED WITH HER DISCHARGE INSTRUCTIONS!!   SHE SHOULD BE ON BOTH OF THSES FOR 2 WEEKS.PLEASE BE SURE SHE HAS FOLLOW UP SCHEDULED IN 2-3 WEEKS AS WELL. Additional Follow-up by: Peterson Ao,  February 12, 2010 12:13 PM     Appended Document: Question about antibiotics. Pt. ntfd. of antibiotic orders.Pt has appt. with Dr.Perry on 02/19/2010 all ready scheduled.

## 2010-12-23 NOTE — Assessment & Plan Note (Signed)
Summary: abd. pain-Dr.Perry   History of Present Illness Visit Type: new patient  Primary GI MD: Yancey Flemings MD Primary Provider: Lanell Persons, MD  Requesting Provider: n/a Chief Complaint: Generalized abd pain, constipation,  and bloating  History of Present Illness:   Patient seen three years ago by Dr. Marina Goodell for GERD / dysphagia. Recent problems with constipation then last Wednsday she developed diffuse lower abdominal pain. Pain was constant, stabbing. She is still very uncomfortable. She has been taking Miralax  but only having small frequent stools. She complains of bloating and loud gurgling. No fevers. No rectal bleeding.  No nausea or vomiting. No dysuria. Prior to onset of acute problems, BMs were mostly normal with occasional constipation. On weight loss problem under Dr. Marchia Bond and has lost 51 pounds over last several months.    History of GERD, heartburn controlled with daily PPI.      GI Review of Systems    Reports abdominal pain and  bloating.     Location of  Abdominal pain: lower abdomen. Weight loss of 51 pounds over several months.   Denies acid reflux, belching, chest pain, dysphagia with liquids, dysphagia with solids, heartburn, loss of appetite, nausea, vomiting, vomiting blood, weight loss, and  weight gain.      Reports change in bowel habits and  constipation.     Denies anal fissure, black tarry stools, diarrhea, diverticulosis, fecal incontinence, heme positive stool, hemorrhoids, irritable bowel syndrome, jaundice, light color stool, liver problems, rectal bleeding, and  rectal pain.                                                                                               Current Medications (verified): 1)  Prilosec 20 Mg  Cpdr (Omeprazole) .... One Tablet By Mouth Once Daily 2)  Phentermine Hcl 30 Mg Caps (Phentermine Hcl) .... One Capsule By Mouth Once Daily 3)  Tylenol Extra Strength 500 Mg Tabs (Acetaminophen) .... Two Tablets By Mouth Once  Daily  Allergies (verified): No Known Drug Allergies  Past History:  Past Medical History: Colonic polyps, hx of Hepatitis A, hx of Smoker Hyperlipidemia Osteoarthritis Diabetes: Controlled by diet and weight loss   Past Surgical History: Reviewed history from 10/28/2007 and no changes required. Appendectomy-1976 Caesarean section x 12-1970 & 1973 Hip replacement-09/2004 [id number]  Family History: No FH of Colon Cancer: Stomach Cancer: PGM Family History of Heart Disease: Paternal Uncles   Social History: Occupation: Sales Married 2 childern Patient currently smokes.  Alcohol Use - yes:3-4 a week  Daily Caffeine Use: 2 daily  Illicit Drug Use - no Smoking Status:  current Drug Use:  no  Review of Systems       The patient complains of arthritis/joint pain, back pain, urination - excessive, and urine leakage.  The patient denies allergy/sinus, anemia, anxiety-new, blood in urine, breast changes/lumps, change in vision, confusion, cough, coughing up blood, depression-new, fainting, fatigue, fever, headaches-new, hearing problems, heart murmur, heart rhythm changes, itching, menstrual pain, muscle pains/cramps, night sweats, nosebleeds, pregnancy symptoms, shortness of breath, skin rash, sleeping problems, sore throat, swelling of feet/legs, swollen lymph glands, thirst -  excessive , urination - excessive , urination changes/pain, vision changes, and voice change.    Vital Signs:  Patient profile:   60 year old female Height:      62 inches Weight:      171 pounds BMI:     31.39 BSA:     1.79 Pulse rate:   76 / minute Pulse rhythm:   regular BP sitting:   128 / 80  (left arm) Cuff size:   regular  Vitals Entered By: Ok Anis CMA (January 21, 2010 2:28 PM)  Physical Exam  General:  Well developed, well nourished, no acute distress. Head:  Normocephalic and atraumatic. Eyes:  Conjunctiva pink, no icterus.  Mouth:  No oral lesions. Tongue moist.  Neck:  no  obvious masses  Lungs:  Clear throughout to auscultation. Heart:  Regular rate and rhythm; no murmurs, rubs,  or bruits. Abdomen:  Abdomen soft, moderate LLQ tenderness. Nondistended. No obvious masses or hepatomegaly.Normal bowel sounds.  Msk:  Symmetrical with no gross deformities. Normal posture. Extremities:  No palmar erythema, no edema.  Neurologic:  Alert and  oriented x4;  grossly normal neurologically. Skin:  Intact without significant lesions or rashes. Cervical Nodes:  No significant cervical adenopathy. Psych:  Alert and cooperative. Normal mood and affect.   Impression & Recommendations:  Problem # 1:  ABDOMINAL PAIN-LLQ (ICD-789.04) Assessment New Associated with constipation. Moderate LLQ tenderness on exam. Rule out diverticulitis. Willl obtain CTscan of abdomen / pelvis if renal function normal.  Will  treat empirically for diverticulitis with Cipro / Flagyl. Check basic labs. Allergic to Codeine. Will give Ultram for pain. Clear liquids for three days then low residue diet until finished with antibiotics.  Return to see Dr. Marina Goodell in 3-4 weeks. In meantime will obtain last colonoscopy from Dr. Recardo Evangelist office.   Orders: CT Abdomen/Pelvis with Contrast (CT Abd/Pelvis w/con) TLB-BMP (Basic Metabolic Panel-BMET) (80048-METABOL)  Problem # 2:  GERD (ICD-530.81) Assessment: Comment Only Stable on PPI  Other Orders: TLB-CBC Platelet - w/Differential (85025-CBCD)  Patient Instructions: 1)  Your physician has requested that you have the following labwork done today: Please go to the lab, basement level. 2)  CT is scheduled at [facility] CT [address] for 01-22-10 at [facility]. 3)  Tomorrow, 01-22-10, Drink the 1st bottle of contrast at [facility] and the 2nd bottle at 1PM.  4)  We made you a follow up appointment with Dr. Marina Goodell at 02-19-10 at 10:15PM.  5)  We sent a perscription for Ultram to your pharmacy. 6)  Copy sent to : Beverlyn Roux, MD 7)  Stay on clear liquid diet for 3  days then advance to the low residue diet until done with Antibiotics. Prescriptions: ULTRAM 50 MG TABS (TRAMADOL HCL) Take 1 tab every 6 hours as needed for pain  #30 x 0   Entered by:   Lowry Ram NCMA   Authorized by:   Willette Cluster NP   Signed by:   Lowry Ram NCMA on 01/21/2010   Method used:   Electronically to        [facility] [phone]* (retail)       [address] [place], [place]  [address]       Ph: [phone]       Fax: [phone]   RxID:   [id number] FLAGYL 500 MG TABS (METRONIDAZOLE) Take 1 tab twice daily x 10 daily  #20 x 0   Entered by:  Pam Peterman NCMA   Authorized by:   Willette Cluster NP   Signed by:   Lowry Ram NCMA on 01/21/2010   Method used:   Electronically to        [facility] [phone]* (retail)       [address] [place], [place]  [address]       Ph: [phone]       Fax: [phone]   RxID:   [id number] CIPRO 500 MG TABS (CIPROFLOXACIN HCL) Take 1 twice daily x 10 days  #20 x 0   Entered by:   Lowry Ram NCMA   Authorized by:   Willette Cluster NP   Signed by:   Lowry Ram NCMA on 01/21/2010   Method used:   Electronically to        [facility] [phone]* (retail)       [address] [place], [place]  [address]       Ph: [phone]       Fax: [phone]   RxID:   [id number]

## 2010-12-23 NOTE — Progress Notes (Signed)
Summary: Diverticulitis follow up- doing better  ---- Converted from flag ---- ---- 01/23/2010 9:42 AM, Hilarie Fredrickson MD wrote: Cont Cipro / Fagyl; low residue diet; f/u w/ me next week. she should call if she worsens in interim (convert this flag to phone note so that it is part of the record)  ---- 01/23/2010 9:31 AM, Willette Cluster NP wrote: Peri Jefferson Morning, You haven't seen this patient recently, I saw her recently for abdominal pain. As you can see from CTscan- mod. diverticulitis. She is at work today, no severe pain, no chills. Her WBC was less than 12. I. Hopefully she won't need IV antibiotics. Do you want to see her tomorrow or early next week? ------------------------------

## 2010-12-23 NOTE — Initial Assessments (Signed)
Summary: diverticulitis/disch. from hosp. yesterday/cl   History of Present Illness Visit Type: Follow-up Visit Primary GI MD: Yancey Flemings MD Primary Provider: Lanell Persons, MD  Requesting Provider: n/a Chief Complaint: Post hospital: severe abdominal pain x 2 days History of Present Illness:   60 Y.O. FEMALE WHO WAS HOSPITALIZED LAST WEEK WITH DIVERTICULITIS WITH  A PHLEGMON AND LEFT HYDRONEPHROSIS.SHE RECEIVED IV FLAGYL AND IV CIPRO  FOR 3 1/2 DAYS. SHE WAS SEEN IN CONSULT BY UROLOGY WHO DID NOT FEEL SHE NEEDEDANY INTERVENTION. SHE BEGGED FOR RELEASE FROM THE HOSPITAL ON 3/6 AND WAS DISCHARGED ON ORAL CIPRO,FLAGYL AND HYDROCODONE. SHE FELT OK THE FIRST 2 DAYS THEN YESTERDAY SHE STARTED HAVING MORE PAIN IN HER LOWER ABDOMEN,AND LEFT BACK. SHE HAS BEEN EATING LITTLE, NO DOCUMENTED FEVERS,HAS HAD CHILLS. SINCE YESTERDAY SHE HAS REQUIRED PAIN MEDICATION EVERY 3-4 HOURS AND IT IS NOT CONTROLLING HER PAIN. SHE WAS SET UP FOR FOLLOW UP CT SCAN TODAY . THIS SHOWS SEVERE PROXIMAL SIGMOID DIVERTICULITIS ,NO CHANGE IN PHLEGMON,AND LEFT HYDRONEPHROSIS.   GI Review of Systems    Reports abdominal pain.     Location of  Abdominal pain: LLQ.    Denies acid reflux, belching, bloating, chest pain, dysphagia with liquids, dysphagia with solids, heartburn, loss of appetite, nausea, vomiting, vomiting blood, weight loss, and  weight gain.      Reports diverticulosis.     Denies anal fissure, black tarry stools, change in bowel habit, constipation, diarrhea, fecal incontinence, heme positive stool, hemorrhoids, irritable bowel syndrome, jaundice, light color stool, liver problems, rectal bleeding, and  rectal pain.    Current Medications (verified): 1)  Prilosec 20 Mg  Cpdr (Omeprazole) .... One Tablet By Mouth Once Daily 2)  Phentermine Hcl 30 Mg Caps (Phentermine Hcl) .... One Capsule By Mouth Once Daily 3)  Tylenol Extra Strength 500 Mg Tabs (Acetaminophen) .... Two Tablets By Mouth Once Daily 4)  Cipro  500 Mg Tabs (Ciprofloxacin Hcl) .... Take 1 Twice Daily X 10 Days 5)  Flagyl 500 Mg Tabs (Metronidazole) .... Take 1 Tab Twice Daily X 10 Daily 6)  Hydrocodone-Acetaminophen 5-325 Mg Tabs (Hydrocodone-Acetaminophen) .... Take 1 Tablet By Mouth Every 6 Hours For Pain  Allergies (verified): No Known Drug Allergies  Past History:  Past Medical History: Colonic polyps, hx of DIVERTICULITIS 3/11-HOSPITALIZED Hepatitis A, hx of Smoker Hyperlipidemia Osteoarthritis Diabetes: Controlled by diet and weight loss   Past Surgical History: Reviewed history from 10/28/2007 and no changes required. Appendectomy-1976 Caesarean section x 12-1970 & 1973 Hip replacement-09/2004 [id number]  Family History: Reviewed history from 01/21/2010 and no changes required. No FH of Colon Cancer: Stomach Cancer: PGM Family History of Heart Disease: Paternal Uncles   Social History: Reviewed history from 01/21/2010 and no changes required. Occupation: Sales Married 2 childern Patient currently smokes.  Alcohol Use - yes:3-4 a week  Daily Caffeine Use: 2 daily  Illicit Drug Use - no  Review of Systems       The patient complains of back pain.  The patient denies allergy/sinus, anemia, anxiety-new, arthritis/joint pain, blood in urine, breast changes/lumps, change in vision, confusion, cough, coughing up blood, depression-new, fainting, fatigue, fever, headaches-new, hearing problems, heart murmur, heart rhythm changes, itching, menstrual pain, muscle pains/cramps, night sweats, nosebleeds, pregnancy symptoms, shortness of breath, skin rash, sleeping problems, sore throat, swelling of feet/legs, swollen lymph glands, thirst - excessive , urination - excessive , urination changes/pain, urine leakage, vision changes, and voice change.         ROS OTHERWISE AS  IN HPI  Vital Signs:  Patient profile:   60 year old female Height:      62 inches Weight:      172 pounds BMI:     31.57 Pulse rate:   124 /  minute Pulse rhythm:   regular BP sitting:   122 / 78  (left arm) Cuff size:   regular  Vitals Entered By: June McMurray CMA Duncan Dull) (January 30, 2010 2:23 PM)  Physical Exam  General:  Well developed, well nourished, no acute distress. Head:  Normocephalic and atraumatic. Eyes:  PERRLA, no icterus. Lungs:  Clear throughout to auscultation. Heart:  Regular rate and rhythm; no murmurs, rubs,  or bruits. Abdomen:  SOFT, TENDER LLQ/SUPRAPUBIC AREA, NO REBOUND, NO MASS OR HSM,BS+ Rectal:  NOT DONE Extremities:  No clubbing, cyanosis, edema or deformities noted. Neurologic:  Alert and  oriented x4;  grossly normal neurologically. Psych:  Alert and cooperative. Normal mood and affect.   Impression & Recommendations:  Problem # 1:  DIVERTICULITIS OF COLON (ICD-562.11) Assessment Deteriorated 60 YO FEMALE WITH PERSISTENT SEVERE DIVERTICULITIS WITH PHLEGMON AND LEFT HYDRONEPHROSIS,FAILING OUTPATIENT MANAGEMENT AFTER A SHORT HOSPITALIZATION LAST WEEK.  ADMIT TO [facility] BOWEL REST IV ZOSYN 3.375  Q 6 HOURS PAIN CONTROL FOLLOW UP LABB SURGICAL CONSULTATION-WOULD HOPE FOR MEDICAL MANAGEMENT ,AND PLAN FOR ELECTIVE RESECTION IN SEVERAL WEEKS. SEE ORDERS  Problem # 2:  OSTEOARTHRITIS (ICD-715.9) Assessment: Comment Only  Problem # 3:  HYPERTENSION (ICD-401.9) Assessment: Comment Only  Problem # 4:  SMOKER Assessment: Comment Only  Appended Document: diverticulitis/disch. from hosp. yesterday/cl seen with Mike Gip PA-C

## 2010-12-23 NOTE — Progress Notes (Signed)
Summary: pain meds  Phone Note Call from Patient Call back at Home Phone [phone]   Caller: Patient Call For: Dr. Marina Goodell Reason for Call: Talk to Nurse Summary of Call: would like to discuss pain medication Initial call taken by: Vallarie Mare,  March 03, 2010 12:04 PM  Follow-up for Phone Call        Pt. requesting more Hydrocodone for her LLQ pain.Had stent put in last Tuesday by Dr.Borden and saw Dr.Martin on Friday and her surgery is scheduled for May 5/11. Follow-up by: Teryl Lucy RN,  March 03, 2010 12:20 PM  Additional Follow-up for Phone Call Additional follow up Details #1::        vicodin-5  is ok if not allergic. #40 , 1-2 q 4-6 hrs as needed  Additional Follow-up by: Hilarie Fredrickson MD,  March 03, 2010 12:37 PM    Additional Follow-up for Phone Call Additional follow up Details #2::    Pt.ntfd. that Dr.Perry will renew Vicodin.Rx. faxed to pharmacy. Follow-up by: Teryl Lucy RN,  March 03, 2010 2:15 PM  New/Updated Medications: VICODIN 5-500 MG TABS (HYDROCODONE-ACETAMINOPHEN) Take 1-2 p.o. q. 4-6 hrs. as needed for abd. pain Prescriptions: VICODIN 5-500 MG TABS (HYDROCODONE-ACETAMINOPHEN) Take 1-2 p.o. q. 4-6 hrs. as needed for abd. pain  #40 x 0   Entered by:   Teryl Lucy RN   Authorized by:   Hilarie Fredrickson MD   Signed by:   Teryl Lucy RN on 03/03/2010   Method used:   Printed then faxed to ...       [address] [phone]* (retail)       [address] [place], [place]  [address]       Ph: [phone]       Fax: [phone]   RxID:   [id number]

## 2010-12-23 NOTE — Progress Notes (Signed)
Summary: Triage / diverticulitis questions  Phone Note Call from Patient Call back at Work Phone [phone]   Caller: Patient Call For: Dr. Marina Goodell Reason for Call: Talk to Nurse Summary of Call: Needs to know if she should continue her antibotics. She is having surgery on 03-28-10 Initial call taken by: Karna Christmas,  March 11, 2010 9:38 AM  Follow-up for Phone Call        Pt. was scheduled for surgery on 03/28/10 but their office called her this am and told her that DR.Daphine Deutscher wants another surgeon to assist him so date will be changed.Finished antibiotics yesterday.Are they to be re-ordered and will need more pain pills.Sometimes uses med. every 2-5 hrs. then sometimes doesn't have to use at all at night. Follow-up by: Teryl Lucy RN,  March 11, 2010 10:21 AM  Additional Follow-up for Phone Call Additional follow up Details #1::        I would prescribe the same antibiotics for an additional 2 weeks. Refill her pain meds as well. She should contact the surgery office and asked Dr. Daphine Deutscher if he wants her on antibiotics thereafter. If so, it may be best if she goes through their office for antibiotics and additional pain medication, as she is anticipating upcoming surgery Additional Follow-up by: Hilarie Fredrickson MD,  March 11, 2010 10:23 AM    Additional Follow-up for Phone Call Additional follow up Details #2::    Pt. ntfd. of Dr.Annasophia Crocker's orders.  Prescriptions: METRONIDAZOLE 500 MG TABS (METRONIDAZOLE) Take 1 p.o. two times a day x 2 weeks-use what you have on hand first(7)  #28 x 0   Entered by:   Teryl Lucy RN   Authorized by:   Hilarie Fredrickson MD   Signed by:   Teryl Lucy RN on 03/11/2010   Method used:   Electronically to        [facility] [phone]* (retail)       [address] [place], [place]  [address]       Ph: [phone]       Fax: [phone]   RxID:   [id number]

## 2010-12-23 NOTE — Procedures (Signed)
Summary: EGD/[facility]  EGD/[facility]   Imported By: Sherian Rein 02/20/2010 09:13:29  _____________________________________________________________________  External Attachment:    Type:   Image     Comment:   External Document

## 2011-01-27 IMAGING — CT CT ABD-PELV W/ CM
2 of 5 series · 17 of 46 positions shown, 19 images · IV contrast (agent unspecified)
Comparison: 01/22/2010

CLINICAL DATA: Diverticulitis.  Worsening left lower quadrant pain.
Previous appendectomy.

CT ABDOMEN AND PELVIS WITH CONTRAST
TECHNIQUE: Multidetector CT imaging of the abdomen and pelvis was
performed following the standard protocol during bolus
administration of intravenous contrast.
Contrast: 100 ml 1mnipaque-YMM and oral contrast

[Series 2: rtn a/p with · axial · 0.80mm/px · z∈[-788,-393]mm · 14 of 89 slices shown, 16 images]
[im 5/89  soft-tissue]
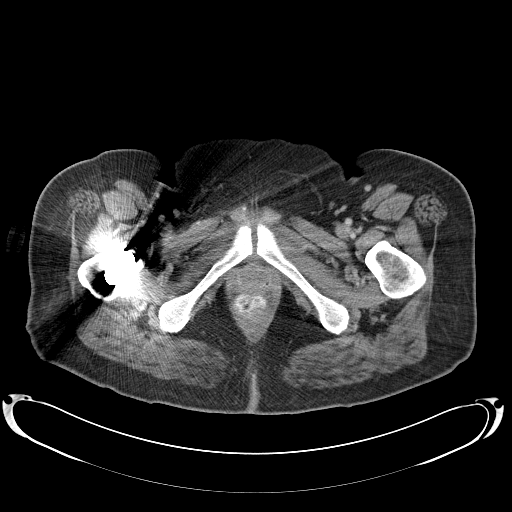
[im 5/89  bone]
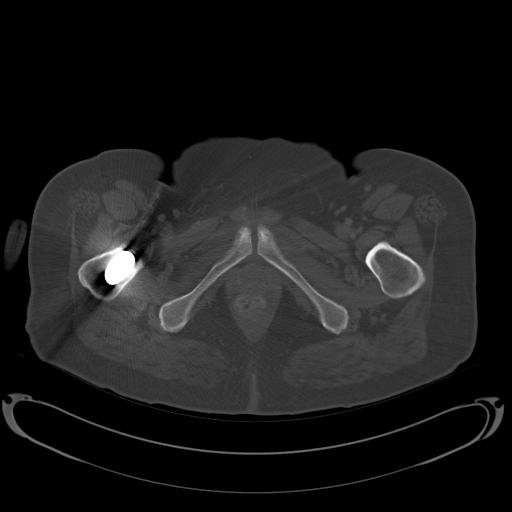
[im 10/89  soft-tissue]
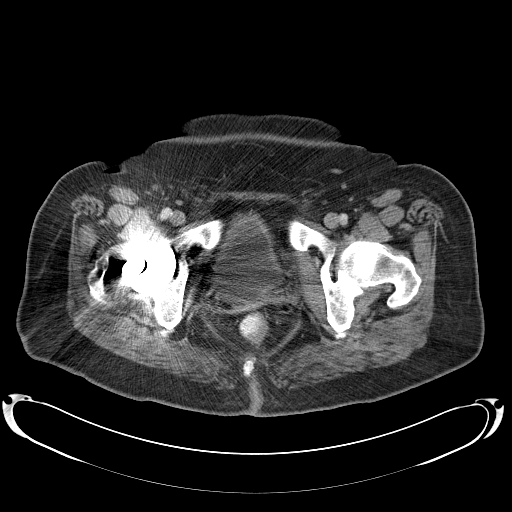
[im 19/89  soft-tissue]
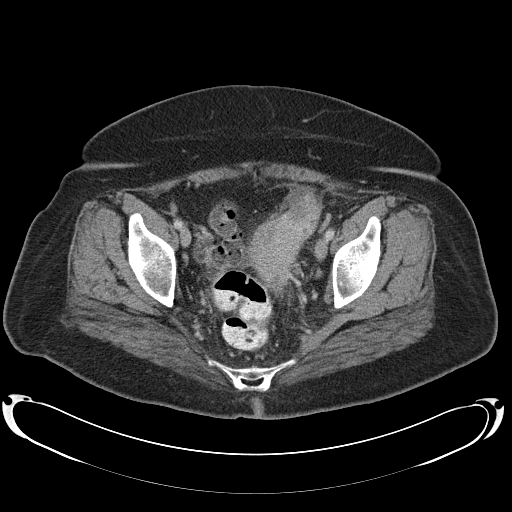
[im 24/89  soft-tissue]
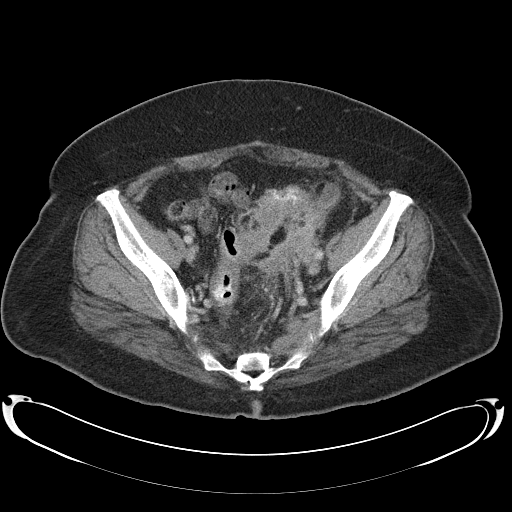
[im 28/89  soft-tissue]
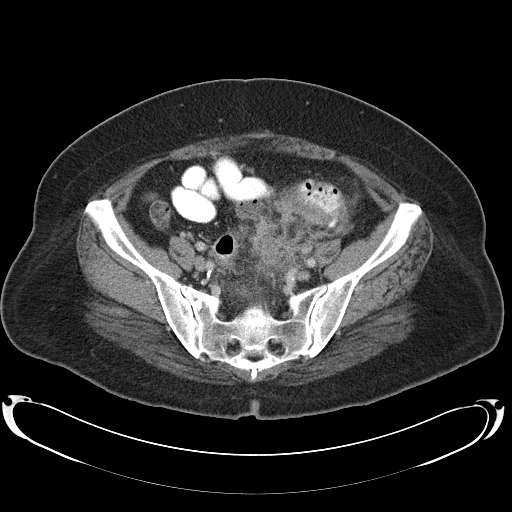
[im 38/89  soft-tissue]
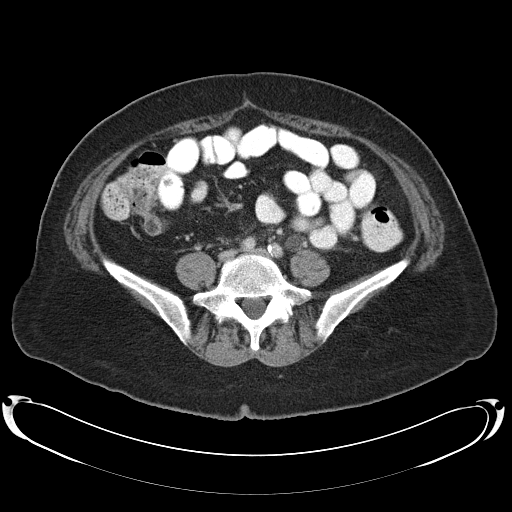
[im 42/89  soft-tissue]
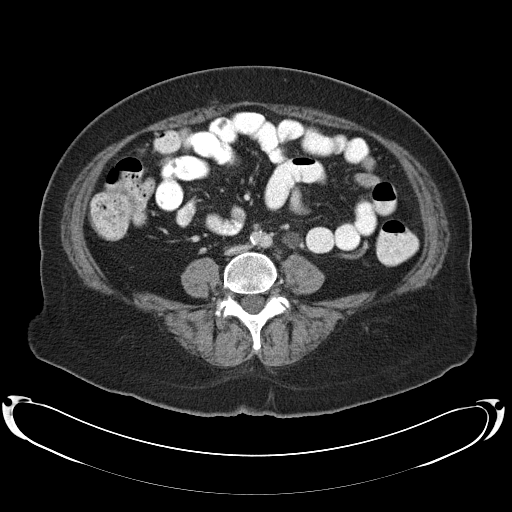
[im 47/89  soft-tissue]
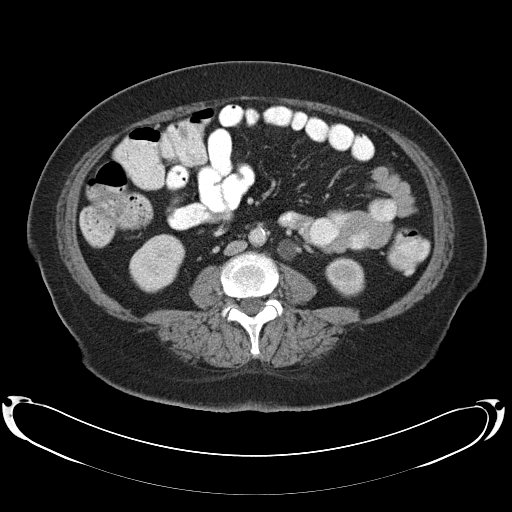
[im 51/89  soft-tissue]
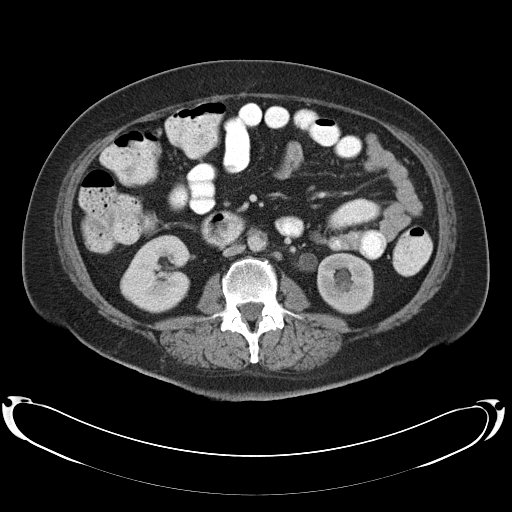
[im 51/89  bone]
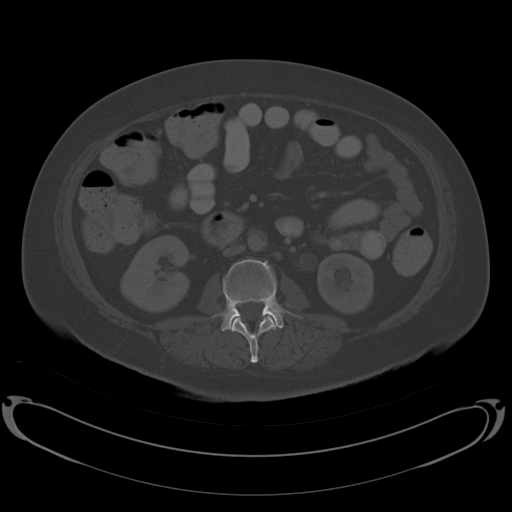
[im 61/89  soft-tissue]
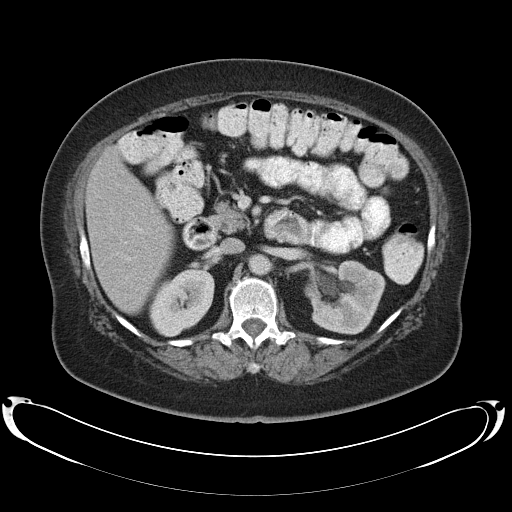
[im 65/89  soft-tissue]
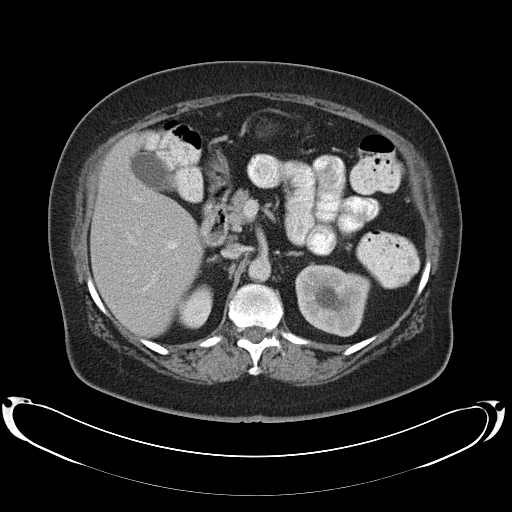
[im 70/89  soft-tissue]
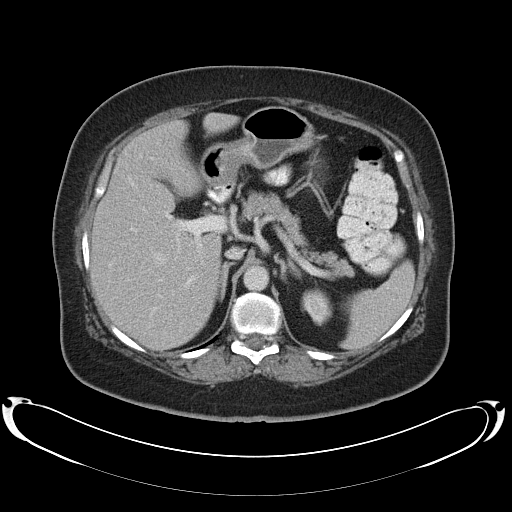
[im 79/89  soft-tissue]
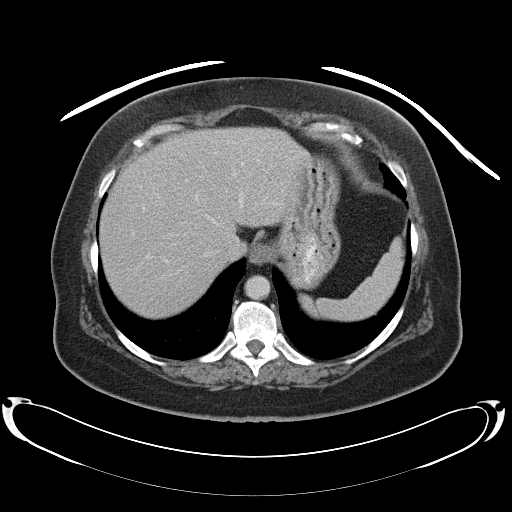
[im 84/89  soft-tissue]
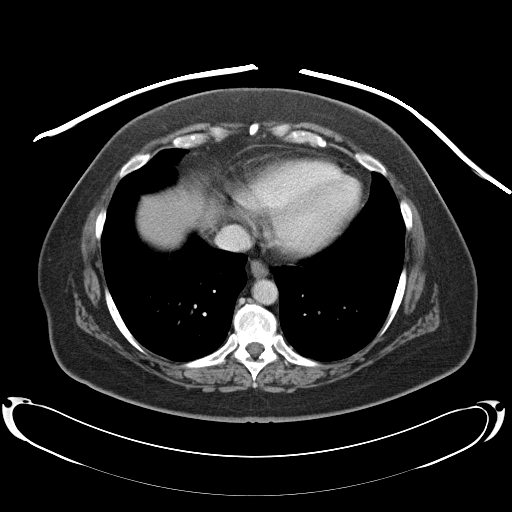

[Series 602: <mpr thick range> · coronal · 0.87mm/px · 3 of 89 slices shown]
[im 30/89  soft-tissue]
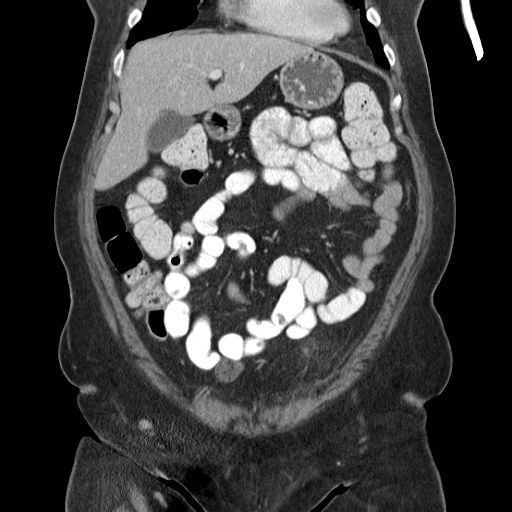
[im 40/89  soft-tissue]
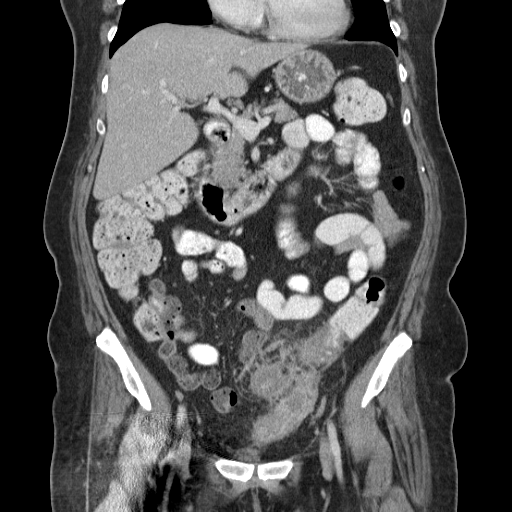
[im 49/89  soft-tissue]
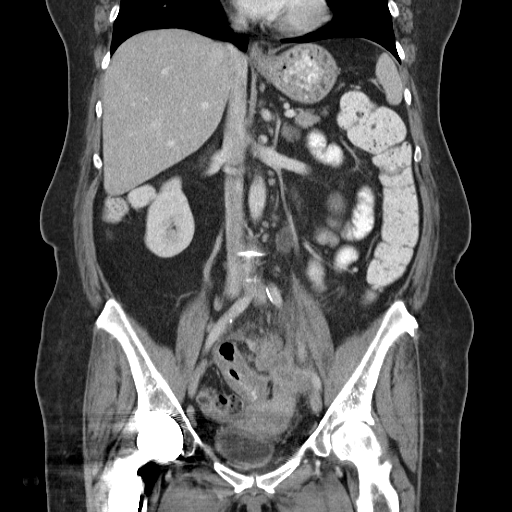

[17 of 46 positions shown; findings below may reference images not displayed]

FINDINGS: Tiny gallstone is again noted without evidence of
cholecystitis.  The abdominal parenchymal organs remain
unremarkable appearance except for moderate left hydronephrosis and
ureterectasis which is stable.

Severe proximal sigmoid diverticulitis is again seen.  Enhancing
phlegmonous soft tissue density is seen along the lateral aspect of
the sigmoid colon, however no drainable fluid collections are seen.
This is not significant changed since previous exam and continues
to resulting obstruction of the distal left ureter.
IMPRESSION: 1.  No significant change in severe sigmoid diverticulitis and
associated phlegmon causing obstruction of the distal left ureter.
Stable left hydroureteronephrosis.
2.  No drainable fluid collection/abscess identified.
3.  Cholelithiasis incidentally noted.

## 2011-02-07 LAB — DIFFERENTIAL
Lymphocytes Relative: 36 % (ref 12–46)
Lymphs Abs: 3.4 10*3/uL (ref 0.7–4.0)
Monocytes Absolute: 0.7 10*3/uL (ref 0.1–1.0)
Monocytes Relative: 7 % (ref 3–12)
Neutro Abs: 5.1 10*3/uL (ref 1.7–7.7)

## 2011-02-07 LAB — URINALYSIS, ROUTINE W REFLEX MICROSCOPIC
Bilirubin Urine: NEGATIVE
Nitrite: POSITIVE — AB
Specific Gravity, Urine: 1.019 (ref 1.005–1.030)
Urobilinogen, UA: 1 mg/dL (ref 0.0–1.0)

## 2011-02-07 LAB — CBC
MCH: 28.6 pg (ref 26.0–34.0)
MCV: 85.4 fL (ref 78.0–100.0)
Platelets: 205 10*3/uL (ref 150–400)
RDW: 14.9 % (ref 11.5–15.5)
WBC: 9.5 10*3/uL (ref 4.0–10.5)

## 2011-02-07 LAB — COMPREHENSIVE METABOLIC PANEL
AST: 19 U/L (ref 0–37)
Albumin: 3.8 g/dL (ref 3.5–5.2)
Calcium: 8.9 mg/dL (ref 8.4–10.5)
Chloride: 108 mEq/L (ref 96–112)
Creatinine, Ser: 0.52 mg/dL (ref 0.4–1.2)

## 2011-02-07 LAB — URINE CULTURE

## 2011-02-07 LAB — URINE MICROSCOPIC-ADD ON

## 2011-02-09 LAB — CBC
HCT: 32.8 % — ABNORMAL LOW (ref 36.0–46.0)
Hemoglobin: 10.9 g/dL — ABNORMAL LOW (ref 12.0–15.0)
Hemoglobin: 10.9 g/dL — ABNORMAL LOW (ref 12.0–15.0)
Hemoglobin: 11 g/dL — ABNORMAL LOW (ref 12.0–15.0)
MCHC: 32.3 g/dL (ref 30.0–36.0)
MCHC: 33.2 g/dL (ref 30.0–36.0)
MCHC: 33.9 g/dL (ref 30.0–36.0)
MCV: 85.6 fL (ref 78.0–100.0)
MCV: 86.2 fL (ref 78.0–100.0)
Platelets: 184 10*3/uL (ref 150–400)
Platelets: 204 10*3/uL (ref 150–400)
RBC: 3.49 MIL/uL — ABNORMAL LOW (ref 3.87–5.11)
RDW: 14.3 % (ref 11.5–15.5)
RDW: 14.5 % (ref 11.5–15.5)
RDW: 14.5 % (ref 11.5–15.5)
RDW: 14.6 % (ref 11.5–15.5)
WBC: 10.2 10*3/uL (ref 4.0–10.5)
WBC: 13 10*3/uL — ABNORMAL HIGH (ref 4.0–10.5)

## 2011-02-09 LAB — DIFFERENTIAL
Basophils Absolute: 0 10*3/uL (ref 0.0–0.1)
Basophils Absolute: 0 10*3/uL (ref 0.0–0.1)
Basophils Absolute: 0.3 10*3/uL — ABNORMAL HIGH (ref 0.0–0.1)
Basophils Relative: 0 % (ref 0–1)
Basophils Relative: 0 % (ref 0–1)
Basophils Relative: 2 % — ABNORMAL HIGH (ref 0–1)
Eosinophils Absolute: 0 10*3/uL (ref 0.0–0.7)
Eosinophils Absolute: 0.2 10*3/uL (ref 0.0–0.7)
Eosinophils Absolute: 0.4 10*3/uL (ref 0.0–0.7)
Eosinophils Relative: 2 % (ref 0–5)
Lymphocytes Relative: 13 % (ref 12–46)
Lymphs Abs: 1.6 10*3/uL (ref 0.7–4.0)
Lymphs Abs: 2 10*3/uL (ref 0.7–4.0)
Monocytes Absolute: 1.3 10*3/uL — ABNORMAL HIGH (ref 0.1–1.0)
Monocytes Absolute: 2.2 10*3/uL — ABNORMAL HIGH (ref 0.1–1.0)
Monocytes Relative: 10 % (ref 3–12)
Neutro Abs: 11 10*3/uL — ABNORMAL HIGH (ref 1.7–7.7)
Neutro Abs: 11.8 10*3/uL — ABNORMAL HIGH (ref 1.7–7.7)
Neutro Abs: 7.1 10*3/uL (ref 1.7–7.7)
Neutrophils Relative %: 69 % (ref 43–77)
Neutrophils Relative %: 71 % (ref 43–77)
Neutrophils Relative %: 72 % (ref 43–77)
Neutrophils Relative %: 78 % — ABNORMAL HIGH (ref 43–77)

## 2011-02-09 LAB — GLUCOSE, CAPILLARY
Glucose-Capillary: 100 mg/dL — ABNORMAL HIGH (ref 70–99)
Glucose-Capillary: 102 mg/dL — ABNORMAL HIGH (ref 70–99)
Glucose-Capillary: 113 mg/dL — ABNORMAL HIGH (ref 70–99)
Glucose-Capillary: 114 mg/dL — ABNORMAL HIGH (ref 70–99)
Glucose-Capillary: 115 mg/dL — ABNORMAL HIGH (ref 70–99)
Glucose-Capillary: 119 mg/dL — ABNORMAL HIGH (ref 70–99)
Glucose-Capillary: 123 mg/dL — ABNORMAL HIGH (ref 70–99)
Glucose-Capillary: 124 mg/dL — ABNORMAL HIGH (ref 70–99)
Glucose-Capillary: 124 mg/dL — ABNORMAL HIGH (ref 70–99)
Glucose-Capillary: 127 mg/dL — ABNORMAL HIGH (ref 70–99)
Glucose-Capillary: 128 mg/dL — ABNORMAL HIGH (ref 70–99)
Glucose-Capillary: 167 mg/dL — ABNORMAL HIGH (ref 70–99)

## 2011-02-09 LAB — BASIC METABOLIC PANEL
BUN: 6 mg/dL (ref 6–23)
Chloride: 106 mEq/L (ref 96–112)
Glucose, Bld: 180 mg/dL — ABNORMAL HIGH (ref 70–99)
Potassium: 4 mEq/L (ref 3.5–5.1)

## 2011-02-10 LAB — CBC
Hemoglobin: 12.3 g/dL (ref 12.0–15.0)
MCHC: 32.5 g/dL (ref 30.0–36.0)
RDW: 14.4 % (ref 11.5–15.5)

## 2011-02-10 LAB — COMPREHENSIVE METABOLIC PANEL
ALT: 15 U/L (ref 0–35)
AST: 20 U/L (ref 0–37)
Albumin: 3.7 g/dL (ref 3.5–5.2)
Alkaline Phosphatase: 71 U/L (ref 39–117)
Calcium: 9.2 mg/dL (ref 8.4–10.5)
GFR calc Af Amer: >60 mL/min (ref >60)
Potassium: 4.5 mEq/L (ref 3.5–5.1)
Sodium: 143 mEq/L (ref 135–145)
Total Protein: 8 g/dL (ref 6.0–8.3)

## 2011-02-11 LAB — POCT I-STAT 4, (NA,K, GLUC, HGB,HCT)
HCT: 39 % (ref 36.0–46.0)
Hemoglobin: 13.3 g/dL (ref 12.0–15.0)

## 2011-02-16 LAB — LIPID PANEL
Cholesterol: 151 mg/dL (ref 0–200)
LDL Cholesterol: 106 mg/dL — ABNORMAL HIGH (ref 0–99)
Total CHOL/HDL Ratio: 5.4 RATIO
VLDL: 17 mg/dL (ref 0–40)

## 2011-02-16 LAB — CBC
HCT: 35.2 % — ABNORMAL LOW (ref 36.0–46.0)
HCT: 35.7 % — ABNORMAL LOW (ref 36.0–46.0)
HCT: 36.9 % (ref 36.0–46.0)
HCT: 37.9 % (ref 36.0–46.0)
Hemoglobin: 11.8 g/dL — ABNORMAL LOW (ref 12.0–15.0)
Hemoglobin: 11.9 g/dL — ABNORMAL LOW (ref 12.0–15.0)
Hemoglobin: 12.1 g/dL (ref 12.0–15.0)
Hemoglobin: 12.1 g/dL (ref 12.0–15.0)
Hemoglobin: 12.3 g/dL (ref 12.0–15.0)
MCHC: 32 g/dL (ref 30.0–36.0)
MCHC: 33.3 g/dL (ref 30.0–36.0)
MCHC: 33.4 g/dL (ref 30.0–36.0)
MCV: 89.3 fL (ref 78.0–100.0)
Platelets: 252 10*3/uL (ref 150–400)
Platelets: 272 10*3/uL (ref 150–400)
Platelets: 339 10*3/uL (ref 150–400)
RBC: 4.05 MIL/uL (ref 3.87–5.11)
RDW: 12.9 % (ref 11.5–15.5)
RDW: 13.2 % (ref 11.5–15.5)
RDW: 13.5 % (ref 11.5–15.5)
RDW: 13.5 % (ref 11.5–15.5)
WBC: 11 10*3/uL — ABNORMAL HIGH (ref 4.0–10.5)
WBC: 13.9 10*3/uL — ABNORMAL HIGH (ref 4.0–10.5)
WBC: 9.2 10*3/uL (ref 4.0–10.5)

## 2011-02-16 LAB — BASIC METABOLIC PANEL
BUN: 4 mg/dL — ABNORMAL LOW (ref 6–23)
BUN: 4 mg/dL — ABNORMAL LOW (ref 6–23)
CO2: 29 mEq/L (ref 19–32)
Calcium: 8.5 mg/dL (ref 8.4–10.5)
Calcium: 8.6 mg/dL (ref 8.4–10.5)
Chloride: 107 mEq/L (ref 96–112)
GFR calc Af Amer: >60 mL/min (ref >60)
GFR calc non Af Amer: >60 mL/min (ref >60)
GFR calc non Af Amer: >60 mL/min (ref >60)
Glucose, Bld: 103 mg/dL — ABNORMAL HIGH (ref 70–99)
Glucose, Bld: 113 mg/dL — ABNORMAL HIGH (ref 70–99)
Glucose, Bld: 167 mg/dL — ABNORMAL HIGH (ref 70–99)
Potassium: 3.8 mEq/L (ref 3.5–5.1)
Potassium: 4.1 mEq/L (ref 3.5–5.1)
Sodium: 140 mEq/L (ref 135–145)
Sodium: 141 mEq/L (ref 135–145)
Sodium: 142 mEq/L (ref 135–145)

## 2011-02-16 LAB — URINALYSIS, ROUTINE W REFLEX MICROSCOPIC
Bilirubin Urine: NEGATIVE
Nitrite: NEGATIVE
Specific Gravity, Urine: 1.011 (ref 1.005–1.030)
Urobilinogen, UA: 0.2 mg/dL (ref 0.0–1.0)
pH: 5.5 (ref 5.0–8.0)

## 2011-02-16 LAB — URINALYSIS, DIPSTICK ONLY
Ketones, ur: NEGATIVE mg/dL
Leukocytes, UA: NEGATIVE
Nitrite: NEGATIVE
Protein, ur: NEGATIVE mg/dL
Urobilinogen, UA: 0.2 mg/dL (ref 0.0–1.0)

## 2011-02-16 LAB — DIFFERENTIAL
Basophils Relative: 1 % (ref 0–1)
Eosinophils Relative: 1 % (ref 0–5)
Monocytes Absolute: 1.5 10*3/uL — ABNORMAL HIGH (ref 0.1–1.0)
Monocytes Relative: 11 % (ref 3–12)
Neutro Abs: 9.8 10*3/uL — ABNORMAL HIGH (ref 1.7–7.7)

## 2011-02-16 LAB — COMPREHENSIVE METABOLIC PANEL
ALT: 11 U/L (ref 0–35)
ALT: 13 U/L (ref 0–35)
AST: 16 U/L (ref 0–37)
Albumin: 3.1 g/dL — ABNORMAL LOW (ref 3.5–5.2)
Alkaline Phosphatase: 58 U/L (ref 39–117)
Alkaline Phosphatase: 65 U/L (ref 39–117)
BUN: 7 mg/dL (ref 6–23)
BUN: 7 mg/dL (ref 6–23)
CO2: 31 mEq/L (ref 19–32)
Chloride: 104 mEq/L (ref 96–112)
GFR calc Af Amer: >60 mL/min (ref >60)
GFR calc non Af Amer: >60 mL/min (ref >60)
Glucose, Bld: 93 mg/dL (ref 70–99)
Potassium: 3.1 mEq/L — ABNORMAL LOW (ref 3.5–5.1)
Potassium: 3.8 mEq/L (ref 3.5–5.1)
Sodium: 138 mEq/L (ref 135–145)
Sodium: 143 mEq/L (ref 135–145)
Total Protein: 7.2 g/dL (ref 6.0–8.3)

## 2011-02-16 LAB — HEMOGLOBIN A1C: Hgb A1c MFr Bld: 6.1 % (ref 4.6–6.1)

## 2011-04-10 NOTE — Discharge Summary (Signed)
NAMEMARKELLE, ASARO NO.:  [id number]   MEDICAL RECORD NO.:  [id number]          PATIENT TYPE:  INP   LOCATION:  1610                         FACILITY:  [facility]   PHYSICIAN:  Burnard Bunting, M.D.    DATE OF BIRTH:  03-14-1951   DATE OF ADMISSION:  10/21/2004  DATE OF DISCHARGE:  10/25/2004                                 DISCHARGE SUMMARY   DISCHARGE DIAGNOSES:  Right hip arthritis.   SECONDARY DIAGNOSES:  1.  Obesity.  2.  Gastroesophageal reflux disease.   OPERATION:  Right total hip replacement performed October 21, 2004.   HOSPITAL COURSE:  Jane [name] is a 60 year old female with severe right hip  arthritis. She presents now for right total hip replacement. The patient  underwent right total hip replacement October 21, 2004 without  complications. She tolerated the procedure well without immediate  complications.  Leg lengths were equal and dorsiflexion and plantar flexion  were intact. On postoperative day one, the patient was mobilized with  physical therapy, weightbearing as tolerated.  Postoperative x-rays showed  prosthesis to be in good alignment.  The patient was started on Coumadin for  DVT prophylaxis on the day of surgery. Her recovery was otherwise  uneventful. The incision was intact on postoperative day October 25, 2004.  She was competent with physical therapy and was discharged home with home  health PT on October 25, 2004.   DISCHARGE MEDICATIONS:  Percocet, Coumadin and Robaxin as well as her  preoperative medications.   Hip precautions are reviewed with the patient and she will followup with me  in seven days for suture removal.      GSD/MEDQ  D:  12/25/2004  T:  12/25/2004  Job:  [id number]

## 2011-04-10 NOTE — Assessment & Plan Note (Signed)
[facility]                         GASTROENTEROLOGY OFFICE NOTE   NAME:[name], [name]                         MRN:          [id number]  DATE:12/17/2006                            DOB:          [birth year]    REFERRING PHYSICIAN:  Chales Salmon. Abigail Miyamoto, M.D.   REASON FOR CONSULTATION:  Reflux and dysphagia.   HISTORY:  This is a 60 year old female with the history of  gastroesophageal reflux disease, complicated by peptic stricture, for  which she underwent upper endoscopy with esophageal dilation in March of  1996.  She was last seen in the office in March of 1997, for heartburn.  She was treated with an antireflux regimen and placed on Prilosec 20 mg  daily.  She was to follow up in one year, but has not been seen since.  Her weight at that time was 195.  Since that time, she has had gradual  weight-gain to a maximum of about 215 pounds.  She has had chronic  indigestion and heartburn for which she uses Prilosec OTC on demand.  In  recent months, she has noticed some difficulty with recurrent solid-food  dysphagia.  Also episodes at night with choking on her saliva and  regurgitation.  She is happy to report that she is involved with a  weight-loss program, which has yielded a 14-pound weight-loss since the  beginning of the year.  Her heartburn has improved.  Other symptoms  persist.  No other active GI complaints at this time.   PAST MEDICAL HISTORY:  1. Diabetes.  2. Hyperlipidemia.  3. Osteoarthritis.   PAST SURGICAL HISTORY:  1. Tubal ligation.  2. Appendectomy.  3. Hip replacement.   CURRENT MEDICATIONS:  1. Effexor 75 mg daily.  2. Glucophage b.i.d.  3. Multivitamin.  4. Prilosec p.r.n.   FAMILY HISTORY:  No family history of gastrointestinal malignancy.   SOCIAL HISTORY:  The patient is married with two daughters.  She lives  with her husband.  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  She  smokes, but denies alcohol use.   REVIEW OF SYSTEMS:  Per diagnostic evaluation form.   PHYSICAL EXAMINATION:  A pleasant female, in no acute distress.  She is  alert and oriented.  Blood pressure is 116/72, heart rate is 80 and  regular, weight is 202.2 pounds.  She is 5 feet 2 inches in height.  HEENT:  Sclerae are anicteric.  Conjunctivae are pink.  Oral mucosa is  intact, no adenopathy.  LUNGS:  Clear.  HEART:  Regular.  ABDOMEN:  Soft, without tenderness, mass or hernia.   IMPRESSION:  Gastroesophageal reflux disease with intermittent  dysphagia.  Rule out peptic stricture.   RECOMMENDATIONS:  1. Resume Prilosec 20 mg daily.  2. Strict adherence to reflux precautions.  3. Schedule upper endoscopy with probable esophageal dilation.  The      nature of the procedure, as well as the risks, benefits, and      alternatives, have been reviewed.  She understood and agreed to      proceed.  4. Ongoing general medical care with Dr. Abigail Miyamoto.     Jane [name]. Jane Goodell, MD  Electronically Signed    JNP/MedQ  DD: 12/17/2006  DT: 12/17/2006  Job #: [id number]   cc:   Chales Salmon. Abigail Miyamoto, M.D.

## 2011-04-10 NOTE — Op Note (Signed)
NAMETAVIANA, WESTERGREN NO.:  [id number]   MEDICAL RECORD NO.:  [id number]          PATIENT TYPE:  INP   LOCATION:  2550                         FACILITY:  [facility]   PHYSICIAN:  Burnard Bunting, M.D.    DATE OF BIRTH:  1951/11/10   DATE OF PROCEDURE:  10/21/2004  DATE OF DISCHARGE:                                 OPERATIVE REPORT   PREOPERATIVE DIAGNOSIS:  Right hip arthritis.   POSTOPERATIVE DIAGNOSIS:  Right hip arthritis.   PROCEDURE:  Right total hip replacement.   SURGEON:  Burnard Bunting, M.D.   ANESTHESIA:  General endotracheal.   ESTIMATED BLOOD LOSS:  500 cc.   DRAINS:  Hemovac drain x1.   IMPLANTS:  Stryker Accolade size 3 stem -4 ceramic head, 50-mm Pressfit  acetabular shell.   PROCEDURE IN DETAIL:  The patient was brought into the operating room where  general endotracheal anesthesia was induced.  Preoperative IV antibiotics  were administered.  The patient was placed in the left lateral decubitus  position with the left peroneal nerve and axilla well padded.  The right hip  and leg were subsequently were prepped and draped in the usual sterile  manner and an Ioban drape was used to cover the operative field.  A  posterior approach to the hip was utilized through a 15-cm incision and the  skin and subcutaneous tissue were sharply divided and bleeding points were  controlled with electrocautery.  The fascia lata was encountered and then  divided under direct visualization with the leg abducted.  At this time, a  Charnley retractor was placed, the sciatic nerve was palpated and its  location was known throughout the remaining portion of the case.  The  pyriformis tendon was detached from the capsule and retracted.  The external  rotators were detached using electrocautery.  A plane was developed between  the external rotators and the capsule.  The capsule was then split in T-  shape fashion and tagged with #1 Ethibond suture.  The hip was then  dislocated.  The oscillating saw cut was made approximately one  fingerbreadth proximal to the superior aspect of the lesser trochanter.  This was in accordance with preoperative templating.  At this time, the  acetabular retractor was placed.  The  anterior retractor was placed in a  careful manner directly on the anterior column of the acetabulum.  The  __________ was removed from the fovea, the acetabulum was then reamed up to  a size 50 in approximately 45 degree of abduction and 25 degrees of  anteversion.  The trial liner was placed with excellent pressfit, the 0  degree liner was then placed within the trial cup.  The femur was then  prepared with sequential lateralizing canal location and broaching.  With a  size 3 broach in position, the -4 head was tried.  It gave excellent  stability and full extension and external rotation, position __________ as  well as 90 degrees of hip flexion, 10 degrees of adduction and 70 degrees of  internal rotation.  Intraoperative x-rays demonstrated approximately equal  leg lengths and good position of the cup and fill of the canal from the  prosthesis.  At this time, the trial components were removed.  A true 50  degree liner was placed in pressfit fashion.  The central screw was placed.  At this time, a 0 degree ceramic liner was placed.  The size 3 stem was then  placed with excellent pressfit  obtained.  A -4 ceramic ball was placed with  the same stability parameters maintained.  6 liters of pulsatile irrigation  solution was then used to irrigate out the incision, the sciatic nerve was  again palpated, visualized and found to be intact.  A Hemovac drain was  placed.  The fascia lata was closed using #1 Ethibond suture, followed by a  combination of 0 and 2-0 Vicryl suture, reapproximating the deep and  superficial subdermal tissue, and the skin was closed using skin staples.  An sterile dressing was placed and a knee immobilizer was also placed.   The  patient did demonstrate dorsiflexion of her foot before leaving the OR.       GSD/MEDQ  D:  10/21/2004  T:  10/21/2004  Job:  [id number]

## 2013-11-13 ENCOUNTER — Other Ambulatory Visit (HOSPITAL_COMMUNITY)
Admission: RE | Admit: 2013-11-13 | Discharge: 2013-11-13 | Disposition: A | Discharge status: Home / Self Care | Payer: 59 | Source: Ambulatory Visit | Attending: Family Medicine | Admitting: Family Medicine

## 2013-11-13 ENCOUNTER — Other Ambulatory Visit: Payer: Self-pay | Admitting: Family Medicine

## 2013-11-13 DIAGNOSIS — Z01419 Encounter for gynecological examination (general) (routine) without abnormal findings: Secondary | ICD-10-CM | POA: Insufficient documentation

## 2013-11-19 ENCOUNTER — Encounter: Payer: Self-pay | Admitting: Emergency Medicine

## 2013-11-19 ENCOUNTER — Emergency Department
Admission: EM | Admit: 2013-11-19 | Discharge: 2013-11-19 | Disposition: A | Discharge status: Home / Self Care | Payer: 59 | Source: Home / Self Care | Attending: Family Medicine | Admitting: Family Medicine

## 2013-11-19 DIAGNOSIS — J069 Acute upper respiratory infection, unspecified: Secondary | ICD-10-CM

## 2013-11-19 DIAGNOSIS — K219 Gastro-esophageal reflux disease without esophagitis: Secondary | ICD-10-CM | POA: Insufficient documentation

## 2013-11-19 DIAGNOSIS — F17200 Nicotine dependence, unspecified, uncomplicated: Secondary | ICD-10-CM

## 2013-11-19 DIAGNOSIS — Z716 Tobacco abuse counseling: Secondary | ICD-10-CM

## 2013-11-19 DIAGNOSIS — R062 Wheezing: Secondary | ICD-10-CM

## 2013-11-19 HISTORY — DX: Gastro-esophageal reflux disease without esophagitis: K21.9

## 2013-11-19 MED ORDER — AZITHROMYCIN 250 MG PO TABS: ORAL_TABLET | ORAL | Status: DC

## 2013-11-19 MED ORDER — ALBUTEROL SULFATE HFA 108 (90 BASE) MCG/ACT IN AERS: 2.0000 | INHALATION_SPRAY | RESPIRATORY_TRACT | Status: DC | PRN

## 2013-11-19 MED ORDER — METHYLPREDNISOLONE ACETATE 80 MG/ML IJ SUSP
80.0000 mg | Freq: Once | INTRAMUSCULAR | Status: AC
  Administered 2013-11-19: 80 mg via INTRAMUSCULAR

## 2013-11-19 MED ORDER — BENZONATATE 100 MG PO CAPS: 100.0000 mg | ORAL_CAPSULE | Freq: Three times a day (TID) | ORAL | Status: DC | PRN

## 2013-11-19 NOTE — ED Notes (Signed)
Phillis complains of dry cough for 1 days. Friday she started with an itchy throat. She also reports chills. Denies fever and sweats.

## 2013-11-19 NOTE — ED Provider Notes (Signed)
CSN: [id number]     Arrival date & time 11/19/13  1104 History   First MD Initiated Contact with Patient 11/19/13 1106     Chief Complaint  Patient presents with  . Cough    1 day    HPI  URI Symptoms Onset: 2-3 days  Description: rhinorrhea, nasal congestion, cough, wheezing  Modifying factors:  1/4 PPD smoker; 20+ pack years   Symptoms Nasal discharge: yes Fever: no Sore throat: no Cough: yes Wheezing: yes Ear pain: no GI symptoms: no Sick contacts: yes  Red Flags  Stiff neck: no Dyspnea: no Rash: no Swallowing difficulty: no  Sinusitis Risk Factors Headache/face pain: no Double sickening: no tooth pain: no  Allergy Risk Factors Sneezing: no Itchy scratchy throat: no Seasonal symptoms: no  Flu Risk Factors Headache: no muscle aches: no severe fatigue: no   Past Medical History  Diagnosis Date  . GERD (gastroesophageal reflux disease)    Past Surgical History  Procedure Laterality Date  . Cesarean section    . Total hip arthroplasty     History reviewed. No pertinent family history. History  Substance Use Topics  . Smoking status: Current Every Day Smoker -- 0.25 packs/day    Types: Cigarettes  . Smokeless tobacco: Never Used  . Alcohol Use: Yes   OB History   Grav Para Term Preterm Abortions TAB SAB Ect Mult Living                 Review of Systems  All other systems reviewed and are negative.    Allergies  Codeine  Home Medications   Current Outpatient Rx  Name  Route  Sig  Dispense  Refill  . omeprazole (PRILOSEC) 40 MG capsule   Oral   Take 40 mg by mouth daily.         . phentermine 37.5 MG capsule   Oral   Take 37.5 mg by mouth every morning.         Marland Kitchen albuterol (PROVENTIL HFA;VENTOLIN HFA) 108 (90 BASE) MCG/ACT inhaler   Inhalation   Inhale 2 puffs into the lungs every 4 (four) hours as needed for wheezing or shortness of breath (cough, shortness of breath or wheezing.).   1 Inhaler   1   . azithromycin  (ZITHROMAX) 250 MG tablet      Take 2 tabs PO x 1 dose, then 1 tab PO QD x 4 days   6 tablet   0   . benzonatate (TESSALON) 100 MG capsule   Oral   Take 1-2 capsules (100-200 mg total) by mouth 3 (three) times daily as needed for cough.   40 capsule   0    BP 165/81  Pulse 91  Temp(Src) 98.1 F (36.7 C) (Oral)  Ht 5\' 2"  (1.575 m)  Wt 182 lb (82.555 kg)  BMI 33.28 kg/m2  SpO2 96% Physical Exam  Constitutional: She is oriented to person, place, and time. She appears well-developed and well-nourished.  HENT:  Head: Normocephalic and atraumatic.  Right Ear: External ear normal.  Left Ear: External ear normal.  +nasal erythema, rhinorrhea bilaterally, + post oropharyngeal erythema    Eyes: Conjunctivae are normal. Pupils are equal, round, and reactive to light.  Neck: Normal range of motion. Neck supple.  Cardiovascular: Regular rhythm.   Pulmonary/Chest: Effort normal.  Faint wheezes in bases   Abdominal: Soft.  Musculoskeletal: Normal range of motion.  Neurological: She is alert and oriented to person, place, and time.  Skin: Skin is  warm.    ED Course  Procedures (including critical care time) Labs Review Labs Reviewed - No data to display Imaging Review No results found.  EKG Interpretation    Date/Time:    Ventricular Rate:    PR Interval:    QRS Duration:   QT Interval:    QTC Calculation:   R Axis:     Text Interpretation:              MDM   1. URI (upper respiratory infection)   2. Wheezing   3. Tobacco abuse counseling    Likely viral URI w/ secondary wheezing/bronchospasm in setting of active smoking Depomedrol 80mg  IM x1 Tessalon perles for cough  Albuterol inhaler at home.  Discussed smoking cessation at length. Zpak if sxs fail to improve over next 7-10 days or worsen.  Discussed infectious and resp red flags at length.  Follow up as needed.     The patient and/or caregiver has been counseled thoroughly with regard to  treatment plan and/or medications prescribed including dosage, schedule, interactions, rationale for use, and possible side effects and they verbalize understanding. Diagnoses and expected course of recovery discussed and will return if not improved as expected or if the condition worsens. Patient and/or caregiver verbalized understanding.         Doree Albee, MD 11/19/13 [phone]

## 2013-11-21 ENCOUNTER — Other Ambulatory Visit: Payer: Self-pay | Admitting: Family Medicine

## 2013-11-21 DIAGNOSIS — R7989 Other specified abnormal findings of blood chemistry: Secondary | ICD-10-CM

## 2013-11-27 ENCOUNTER — Encounter: Payer: Self-pay | Admitting: Internal Medicine

## 2013-11-28 ENCOUNTER — Ambulatory Visit
Admission: RE | Admit: 2013-11-28 | Discharge: 2013-11-28 | Disposition: A | Discharge status: Home / Self Care | Payer: 59 | Source: Ambulatory Visit | Attending: Family Medicine | Admitting: Family Medicine

## 2013-11-28 DIAGNOSIS — R945 Abnormal results of liver function studies: Secondary | ICD-10-CM

## 2013-11-28 DIAGNOSIS — R7989 Other specified abnormal findings of blood chemistry: Secondary | ICD-10-CM

## 2013-12-25 ENCOUNTER — Ambulatory Visit (AMBULATORY_SURGERY_CENTER): Payer: BC Managed Care – PPO

## 2013-12-25 VITALS — Ht 62.0 in | Wt 178.0 lb

## 2013-12-25 DIAGNOSIS — Z860100 Personal history of colon polyps, unspecified: Secondary | ICD-10-CM

## 2013-12-25 MED ORDER — MOVIPREP 100 G PO SOLR: 1.0000 | Freq: Once | ORAL | Status: DC

## 2013-12-27 ENCOUNTER — Encounter: Payer: Self-pay | Admitting: Internal Medicine

## 2014-01-08 ENCOUNTER — Ambulatory Visit (AMBULATORY_SURGERY_CENTER): Payer: BC Managed Care – PPO | Admitting: Internal Medicine

## 2014-01-08 ENCOUNTER — Encounter: Payer: Self-pay | Admitting: Internal Medicine

## 2014-01-08 VITALS — BP 146/85 | HR 64 | Temp 97.9°F | Resp 16 | Ht 62.0 in | Wt 178.0 lb

## 2014-01-08 DIAGNOSIS — Z1211 Encounter for screening for malignant neoplasm of colon: Secondary | ICD-10-CM

## 2014-01-08 DIAGNOSIS — Z86010 Personal history of colonic polyps: Secondary | ICD-10-CM

## 2014-01-08 MED ORDER — SODIUM CHLORIDE 0.9 % IV SOLN: 500.0000 mL | INTRAVENOUS | Status: DC

## 2014-01-08 NOTE — Patient Instructions (Signed)
YOU HAD AN ENDOSCOPIC PROCEDURE TODAY AT THE [facility]: Refer to the procedure report that was given to you for any specific questions about what was found during the examination.  If the procedure report does not answer your questions, please call your gastroenterologist to clarify.  If you requested that your care partner not be given the details of your procedure findings, then the procedure report has been included in a sealed envelope for you to review at your convenience later.  YOU SHOULD EXPECT: Some feelings of bloating in the abdomen. Passage of more gas than usual.  Walking can help get rid of the air that was put into your GI tract during the procedure and reduce the bloating. If you had a lower endoscopy (such as a colonoscopy or flexible sigmoidoscopy) you may notice spotting of blood in your stool or on the toilet paper. If you underwent a bowel prep for your procedure, then you may not have a normal bowel movement for a few days.  DIET: Your first meal following the procedure should be a light meal and then it is ok to progress to your normal diet.  A half-sandwich or bowl of soup is an example of a good first meal.  Heavy or fried foods are harder to digest and may make you feel nauseous or bloated.  Likewise meals heavy in dairy and vegetables can cause extra gas to form and this can also increase the bloating.  Drink plenty of fluids but you should avoid alcoholic beverages for 24 hours.  ACTIVITY: Your care partner should take you home directly after the procedure.  You should plan to take it easy, moving slowly for the rest of the day.  You can resume normal activity the day after the procedure however you should NOT DRIVE or use heavy machinery for 24 hours (because of the sedation medicines used during the test).    SYMPTOMS TO REPORT IMMEDIATELY: A gastroenterologist can be reached at any hour.  During normal business hours, 8:30 AM to 5:00 PM Monday through Friday,  call [phone].  After hours and on weekends, please call the GI answering service at [phone] who will take a message and have the physician on call contact you.   Following lower endoscopy (colonoscopy or flexible sigmoidoscopy):  Excessive amounts of blood in the stool  Significant tenderness or worsening of abdominal pains  Swelling of the abdomen that is new, acute  Fever of 100F or higher  FOLLOW UP: If any biopsies were taken you will be contacted by phone or by letter within the next 1-3 weeks.  Call your gastroenterologist if you have not heard about the biopsies in 3 weeks.  Our staff will call the home number listed on your records the next business day following your procedure to check on you and address any questions or concerns that you may have at that time regarding the information given to you following your procedure. This is a courtesy call and so if there is no answer at the home number and we have not heard from you through the emergency physician on call, we will assume that you have returned to your regular daily activities without incident.  SIGNATURES/CONFIDENTIALITY: You and/or your care partner have signed paperwork which will be entered into your electronic medical record.  These signatures attest to the fact that that the information above on your After Visit Summary has been reviewed and is understood.  Full responsibility of the confidentiality of this   discharge information lies with you and/or your care-partner.  Moderate Diverticulosis otherwise normal colonoscopy  Repeat colonoscopy in 10 years

## 2014-01-08 NOTE — Progress Notes (Signed)
Patient did not experience any of the following events: a burn prior to discharge; a fall within the facility; wrong site/side/patient/procedure/implant event; or a hospital transfer or hospital admission upon discharge from the facility. (G8907) Patient did not have preoperative order for IV antibiotic SSI prophylaxis. (G8918)  

## 2014-01-08 NOTE — Op Note (Signed)
[place]  [address]. [place], [id number]   COLONOSCOPY PROCEDURE REPORT  PATIENT: Jane [name], Jane [name]  MR#: [id number] BIRTHDATE: 03-Mar-1951 , 62  yrs. old GENDER: Female ENDOSCOPIST: Eustace Quail, MD REFERRED EK:CMKLK Little, M.D. PROCEDURE DATE:  01/08/2014 PROCEDURE:   Colonoscopy, screening First Screening Colonoscopy - Avg.  risk and is 50 yrs.  old or older - No.  Prior Negative Screening - Now for repeat screening. 10 or more years since last screening  History of Adenoma - Now for follow-up colonoscopy & has been > or = to 3 yrs.  N/A  Polyps Removed Today? No.  Recommend repeat exam, <10 yrs? No. ASA CLASS:   Class II INDICATIONS:average risk screening.   Index exam January 2005 (Dr Buccini) w/ recatal HP only and diverticulosis MEDICATIONS: MAC sedation, administered by CRNA and propofol (Diprivan) 230mg  IV  DESCRIPTION OF PROCEDURE:   After the risks benefits and alternatives of the procedure were thoroughly explained, informed consent was obtained.  A digital rectal exam revealed no abnormalities of the rectum.   The LB [id number]  endoscope was introduced through the anus and advanced to the cecum, which was identified by both the appendix and ileocecal valve. No adverse events experienced.   The quality of the prep was excellent, using MoviPrep  The instrument was then slowly withdrawn as the colon was fully examined.    COLON FINDINGS: Moderate diverticulosis was noted The finding was in the left colon.   The colon mucosa was otherwise normal. Retroflexed views revealed internal hemorrhoids. The time to cecum=1 minutes 29 seconds.  Withdrawal time=8 minutes 24 seconds. The scope was withdrawn and the procedure completed. COMPLICATIONS: There were no complications.  ENDOSCOPIC IMPRESSION: 1.   Moderate diverticulosis was noted in the left colon 2.   The colon mucosa was otherwise normal  RECOMMENDATIONS: 1. Continue current  colorectal screening recommendations for "routine risk" patients with a repeat colonoscopy in 10 years.   eSigned:  Eustace Quail, MD 01/08/2014 11:53 AM   cc: Hulan Fess, MD and The Patient

## 2014-01-08 NOTE — Progress Notes (Signed)
Procedure ends, to recovery, report given and VSS. 

## 2014-01-09 ENCOUNTER — Telehealth: Payer: Self-pay | Admitting: *Deleted

## 2014-01-09 NOTE — Telephone Encounter (Signed)
  Follow up Call-  Call back number 01/08/2014  Post procedure Call Back phone  # [phone]  Permission to leave phone message Yes     Patient questions:  Do you have a fever, pain , or abdominal swelling? no Pain Score  0 *  Have you tolerated food without any problems? yes  Have you been able to return to your normal activities? yes  Do you have any questions about your discharge instructions: Diet   no Medications  no Follow up visit  no  Do you have questions or concerns about your Care? no  Actions: * If pain score is 4 or above: No action needed, pain <4.

## 2014-06-19 ENCOUNTER — Other Ambulatory Visit (HOSPITAL_COMMUNITY): Payer: Self-pay | Admitting: Orthopaedic Surgery

## 2014-07-04 ENCOUNTER — Encounter (HOSPITAL_COMMUNITY): Payer: Self-pay | Admitting: Pharmacy Technician

## 2014-07-05 NOTE — Pre-Procedure Instructions (Addendum)
Jane [name]  07/05/2014   Your procedure is scheduled on:  Tuesday, Aug. 25th   Report to [facility] Admitting at  10:30 AM.   Call this number if you have problems the morning of surgery: [phone]   Remember:   Do not eat food or drink liquids after midnight Monday             Take these medicines the morning of surgery with A SIP OF WATER: Prilosec    Take all meds as ordered until day of surgery except as instructed below or per dr   Jane [name] all herbel meds, nsaids (aleve,naproxen,advil,ibuprofen) 5 days prior to surgery(07/12/14) including vitamins,aspirin  STOP PHENTERAMINE NOW   Do not wear jewelry, make-up or nail polish.  Do not wear lotions, powders, or perfumes. You may NOT wear deodorant.  Do not shave underarms & legs 48 hours prior to surgery.    Do not bring valuables to the hospital.  [facility] is not responsible for any belongings or valuables.               Contacts, dentures or bridgework may not be worn into surgery.  Leave suitcase in the car. After surgery it may be brought to your room.  For patients admitted to the hospital, discharge time is determined by your treatment team.              Name and phone number of your driver:    Special Instruction  Special Instructions: [facility] - Preparing for Surgery  Before surgery, you can play an important role.  Because skin is not sterile, your skin needs to be as free of germs as possible.  You can reduce the number of germs on you skin by washing with CHG (chlorahexidine gluconate) soap before surgery.  CHG is an antiseptic cleaner which kills germs and bonds with the skin to continue killing germs even after washing.  Please DO NOT use if you have an allergy to CHG or antibacterial soaps.  If your skin becomes reddened/irritated stop using the CHG and inform your nurse when you arrive at Short Stay.  Do not shave (including legs and underarms) for at least 48 hours prior to the first CHG  shower.  You may shave your face.  Please follow these instructions carefully:   1.  Shower with CHG Soap the night before surgery and the morning of Surgery.  2.  If you choose to wash your hair, wash your hair first as usual with your normal shampoo.  3.  After you shampoo, rinse your hair and body thoroughly to remove the Shampoo.  4.  Use CHG as you would any other liquid soap.  You can apply chg directly  to the skin and wash gently with scrungie or a clean washcloth.  5.  Apply the CHG Soap to your body ONLY FROM THE NECK DOWN.  Do not use on open wounds or open sores.  Avoid contact with your eyes ears, mouth and genitals (private parts).  Wash genitals (private parts)       with your normal soap.  6.  Wash thoroughly, paying special attention to the area where your surgery will be performed.  7.  Thoroughly rinse your body with warm water from the neck down.  8.  DO NOT shower/wash with your normal soap after using and rinsing off the CHG Soap.  9.  Pat yourself dry with a clean towel.  10.  Wear clean pajamas.            11.  Place clean sheets on your bed the night of your first shower and do not sleep with pets.  Day of Surgery  Do not apply any lotions/deodorants the morning of surgery.  Please wear clean clothes to the hospital/surgery center.   Please read over the following fact sheets that you were given: Pain Booklet, Coughing and Deep Breathing, MRSA Information and Surgical Site Infection Prevention

## 2014-07-06 ENCOUNTER — Encounter (HOSPITAL_COMMUNITY)
Admission: RE | Admit: 2014-07-06 | Discharge: 2014-07-06 | Disposition: A | Discharge status: Home / Self Care | Payer: BC Managed Care – PPO | Source: Ambulatory Visit | Attending: Orthopaedic Surgery | Admitting: Orthopaedic Surgery

## 2014-07-06 ENCOUNTER — Encounter (HOSPITAL_COMMUNITY): Payer: Self-pay

## 2014-07-06 DIAGNOSIS — Z01812 Encounter for preprocedural laboratory examination: Secondary | ICD-10-CM | POA: Insufficient documentation

## 2014-07-06 DIAGNOSIS — Z01810 Encounter for preprocedural cardiovascular examination: Secondary | ICD-10-CM | POA: Insufficient documentation

## 2014-07-06 HISTORY — DX: Inflammatory liver disease, unspecified: K75.9

## 2014-07-06 HISTORY — DX: Malignant (primary) neoplasm, unspecified: C80.1

## 2014-07-06 HISTORY — DX: Personal history of urinary (tract) infections: Z87.440

## 2014-07-06 HISTORY — DX: Unspecified osteoarthritis, unspecified site: M19.90

## 2014-07-06 HISTORY — DX: Cardiac murmur, unspecified: R01.1

## 2014-07-06 HISTORY — DX: Pneumonia, unspecified organism: J18.9

## 2014-07-06 LAB — COMPREHENSIVE METABOLIC PANEL
ALT: 54 U/L — ABNORMAL HIGH (ref 0–35)
ANION GAP: 11 (ref 5–15)
AST: 56 U/L — ABNORMAL HIGH (ref 0–37)
Albumin: 3.7 g/dL (ref 3.5–5.2)
Alkaline Phosphatase: 82 U/L (ref 39–117)
BILIRUBIN TOTAL: 0.7 mg/dL (ref 0.3–1.2)
BUN: 12 mg/dL (ref 6–23)
CHLORIDE: 99 meq/L (ref 96–112)
CO2: 27 meq/L (ref 19–32)
CREATININE: 0.52 mg/dL (ref 0.50–1.10)
Calcium: 8.8 mg/dL (ref 8.4–10.5)
Glucose, Bld: 100 mg/dL — ABNORMAL HIGH (ref 70–99)
Potassium: 3.9 mEq/L (ref 3.7–5.3)
Sodium: 137 mEq/L (ref 137–147)
Total Protein: 7.3 g/dL (ref 6.0–8.3)

## 2014-07-06 LAB — CBC
HCT: 42.3 % (ref 36.0–46.0)
Hemoglobin: 14.4 g/dL (ref 12.0–15.0)
MCH: 31.6 pg (ref 26.0–34.0)
MCHC: 34 g/dL (ref 30.0–36.0)
MCV: 93 fL (ref 78.0–100.0)
PLATELETS: 199 10*3/uL (ref 150–400)
RBC: 4.55 MIL/uL (ref 3.87–5.11)
RDW: 13 % (ref 11.5–15.5)
WBC: 7.7 10*3/uL (ref 4.0–10.5)

## 2014-07-06 LAB — URINALYSIS, ROUTINE W REFLEX MICROSCOPIC
Bilirubin Urine: NEGATIVE
Glucose, UA: NEGATIVE mg/dL
Hgb urine dipstick: NEGATIVE
Ketones, ur: NEGATIVE mg/dL
Leukocytes, UA: NEGATIVE
NITRITE: NEGATIVE
Protein, ur: NEGATIVE mg/dL
Specific Gravity, Urine: 1.011 (ref 1.005–1.030)
UROBILINOGEN UA: 0.2 mg/dL (ref 0.0–1.0)
pH: 6.5 (ref 5.0–8.0)

## 2014-07-06 LAB — TYPE AND SCREEN
ABO/RH(D): O POS
ANTIBODY SCREEN: NEGATIVE

## 2014-07-06 LAB — ABO/RH: ABO/RH(D): O POS

## 2014-07-06 LAB — SURGICAL PCR SCREEN
MRSA, PCR: POSITIVE — AB
Staphylococcus aureus: POSITIVE — AB

## 2014-07-16 MED ORDER — CEFAZOLIN SODIUM-DEXTROSE 2-3 GM-% IV SOLR
2.0000 g | INTRAVENOUS | Status: AC
  Administered 2014-07-17: 2 g via INTRAVENOUS
  Filled 2014-07-16: qty 50

## 2014-07-17 ENCOUNTER — Ambulatory Visit (HOSPITAL_COMMUNITY): Payer: BC Managed Care – PPO | Admitting: Anesthesiology

## 2014-07-17 ENCOUNTER — Inpatient Hospital Stay (HOSPITAL_COMMUNITY): Payer: BC Managed Care – PPO

## 2014-07-17 ENCOUNTER — Inpatient Hospital Stay (HOSPITAL_COMMUNITY)
Admission: RE | Admit: 2014-07-17 | Discharge: 2014-07-20 | DRG: 470 | Disposition: A | Discharge status: Home Health | Payer: BC Managed Care – PPO | Source: Ambulatory Visit | Attending: Orthopaedic Surgery | Admitting: Orthopaedic Surgery

## 2014-07-17 ENCOUNTER — Encounter (HOSPITAL_COMMUNITY): Payer: BC Managed Care – PPO | Admitting: Anesthesiology

## 2014-07-17 ENCOUNTER — Encounter (HOSPITAL_COMMUNITY)
Admission: RE | Disposition: A | Discharge status: Home Health | Payer: Self-pay | Source: Ambulatory Visit | Attending: Orthopaedic Surgery

## 2014-07-17 ENCOUNTER — Encounter (HOSPITAL_COMMUNITY): Payer: Self-pay | Admitting: Certified Registered Nurse Anesthetist

## 2014-07-17 DIAGNOSIS — Z79899 Other long term (current) drug therapy: Secondary | ICD-10-CM | POA: Diagnosis not present

## 2014-07-17 DIAGNOSIS — M169 Osteoarthritis of hip, unspecified: Principal | ICD-10-CM | POA: Diagnosis present

## 2014-07-17 DIAGNOSIS — M25559 Pain in unspecified hip: Secondary | ICD-10-CM | POA: Diagnosis present

## 2014-07-17 DIAGNOSIS — Z87891 Personal history of nicotine dependence: Secondary | ICD-10-CM | POA: Diagnosis not present

## 2014-07-17 DIAGNOSIS — M1612 Unilateral primary osteoarthritis, left hip: Secondary | ICD-10-CM

## 2014-07-17 DIAGNOSIS — Z96642 Presence of left artificial hip joint: Secondary | ICD-10-CM

## 2014-07-17 DIAGNOSIS — M1610 Unilateral primary osteoarthritis, unspecified hip: Principal | ICD-10-CM | POA: Diagnosis present

## 2014-07-17 DIAGNOSIS — Z7982 Long term (current) use of aspirin: Secondary | ICD-10-CM

## 2014-07-17 DIAGNOSIS — K219 Gastro-esophageal reflux disease without esophagitis: Secondary | ICD-10-CM | POA: Diagnosis present

## 2014-07-17 DIAGNOSIS — Z85828 Personal history of other malignant neoplasm of skin: Secondary | ICD-10-CM

## 2014-07-17 HISTORY — PX: TOTAL HIP ARTHROPLASTY: SHX124

## 2014-07-17 LAB — APTT: aPTT: 30 seconds (ref 24–37)

## 2014-07-17 LAB — PROTIME-INR
INR: 1.07 (ref 0.00–1.49)
PROTHROMBIN TIME: 13.9 s (ref 11.6–15.2)

## 2014-07-17 SURGERY — ARTHROPLASTY, HIP, TOTAL, ANTERIOR APPROACH
Anesthesia: General | Site: Hip | Laterality: Left

## 2014-07-17 MED ORDER — CLINDAMYCIN PHOSPHATE 600 MG/50ML IV SOLN
600.0000 mg | Freq: Four times a day (QID) | INTRAVENOUS | Status: AC
  Administered 2014-07-18: 600 mg via INTRAVENOUS
  Filled 2014-07-17 (×2): qty 50

## 2014-07-17 MED ORDER — DIPHENHYDRAMINE HCL 12.5 MG/5ML PO ELIX: 12.5000 mg | ORAL_SOLUTION | ORAL | Status: DC | PRN

## 2014-07-17 MED ORDER — NEOSTIGMINE METHYLSULFATE 10 MG/10ML IV SOLN
INTRAVENOUS | Status: DC | PRN
  Administered 2014-07-17: 5 mg via INTRAVENOUS

## 2014-07-17 MED ORDER — FENTANYL CITRATE 0.05 MG/ML IJ SOLN
INTRAMUSCULAR | Status: AC
Start: 2014-07-17 — End: 2014-07-17
  Filled 2014-07-17: qty 5

## 2014-07-17 MED ORDER — ACETAMINOPHEN 325 MG PO TABS: 650.0000 mg | ORAL_TABLET | Freq: Four times a day (QID) | ORAL | Status: DC | PRN

## 2014-07-17 MED ORDER — ZOLPIDEM TARTRATE 5 MG PO TABS: 5.0000 mg | ORAL_TABLET | Freq: Every evening | ORAL | Status: DC | PRN

## 2014-07-17 MED ORDER — CLINDAMYCIN PHOSPHATE 900 MG/50ML IV SOLN
900.0000 mg | INTRAVENOUS | Status: AC
  Administered 2014-07-17: 900 mg via INTRAVENOUS
  Filled 2014-07-17: qty 50

## 2014-07-17 MED ORDER — METHOCARBAMOL 1000 MG/10ML IJ SOLN
500.0000 mg | Freq: Four times a day (QID) | INTRAVENOUS | Status: DC | PRN
  Filled 2014-07-17: qty 5

## 2014-07-17 MED ORDER — PANTOPRAZOLE SODIUM 40 MG PO TBEC
80.0000 mg | DELAYED_RELEASE_TABLET | Freq: Every day | ORAL | Status: DC
  Administered 2014-07-17 – 2014-07-18 (×2): 80 mg via ORAL
  Filled 2014-07-17 (×4): qty 2

## 2014-07-17 MED ORDER — OXYCODONE HCL 5 MG PO TABS
ORAL_TABLET | ORAL | Status: AC
  Filled 2014-07-17: qty 2

## 2014-07-17 MED ORDER — LIDOCAINE HCL (CARDIAC) 20 MG/ML IV SOLN
INTRAVENOUS | Status: DC | PRN
  Administered 2014-07-17: 80 mg via INTRAVENOUS

## 2014-07-17 MED ORDER — FENTANYL CITRATE 0.05 MG/ML IJ SOLN
INTRAMUSCULAR | Status: AC
  Filled 2014-07-17: qty 2

## 2014-07-17 MED ORDER — ROCURONIUM BROMIDE 100 MG/10ML IV SOLN
INTRAVENOUS | Status: DC | PRN
  Administered 2014-07-17: 40 mg via INTRAVENOUS

## 2014-07-17 MED ORDER — GLYCOPYRROLATE 0.2 MG/ML IJ SOLN
INTRAMUSCULAR | Status: AC
  Filled 2014-07-17: qty 4

## 2014-07-17 MED ORDER — MIDAZOLAM HCL 5 MG/5ML IJ SOLN
INTRAMUSCULAR | Status: DC | PRN
  Administered 2014-07-17: 2 mg via INTRAVENOUS

## 2014-07-17 MED ORDER — HYDROMORPHONE HCL PF 1 MG/ML IJ SOLN
INTRAMUSCULAR | Status: AC
  Filled 2014-07-17: qty 1

## 2014-07-17 MED ORDER — SODIUM CHLORIDE 0.9 % IR SOLN
Status: DC | PRN
  Administered 2014-07-17: 3000 mL

## 2014-07-17 MED ORDER — METOCLOPRAMIDE HCL 5 MG PO TABS
5.0000 mg | ORAL_TABLET | Freq: Three times a day (TID) | ORAL | Status: DC | PRN
  Filled 2014-07-17: qty 2

## 2014-07-17 MED ORDER — DOCUSATE SODIUM 100 MG PO CAPS
100.0000 mg | ORAL_CAPSULE | Freq: Two times a day (BID) | ORAL | Status: DC
  Administered 2014-07-17 – 2014-07-19 (×5): 100 mg via ORAL
  Filled 2014-07-17 (×8): qty 1

## 2014-07-17 MED ORDER — ALUM & MAG HYDROXIDE-SIMETH 200-200-20 MG/5ML PO SUSP: 30.0000 mL | ORAL | Status: DC | PRN

## 2014-07-17 MED ORDER — ACETAMINOPHEN 650 MG RE SUPP
650.0000 mg | Freq: Four times a day (QID) | RECTAL | Status: DC | PRN
Start: 2014-07-17 — End: 2014-07-20

## 2014-07-17 MED ORDER — SODIUM CHLORIDE 0.9 % IV SOLN
INTRAVENOUS | Status: DC
  Administered 2014-07-17 – 2014-07-18 (×2): via INTRAVENOUS

## 2014-07-17 MED ORDER — 0.9 % SODIUM CHLORIDE (POUR BTL) OPTIME
TOPICAL | Status: DC | PRN
  Administered 2014-07-17: 1000 mL

## 2014-07-17 MED ORDER — TRAMADOL HCL 50 MG PO TABS: 100.0000 mg | ORAL_TABLET | Freq: Four times a day (QID) | ORAL | Status: DC | PRN

## 2014-07-17 MED ORDER — LACTATED RINGERS IV SOLN
INTRAVENOUS | Status: DC | PRN
  Administered 2014-07-17 (×2): via INTRAVENOUS

## 2014-07-17 MED ORDER — GLYCOPYRROLATE 0.2 MG/ML IJ SOLN
INTRAMUSCULAR | Status: DC | PRN
  Administered 2014-07-17: .8 mg via INTRAVENOUS

## 2014-07-17 MED ORDER — POLYETHYLENE GLYCOL 3350 17 G PO PACK
17.0000 g | PACK | Freq: Every day | ORAL | Status: DC | PRN
  Administered 2014-07-18 – 2014-07-19 (×2): 17 g via ORAL
  Filled 2014-07-17 (×3): qty 1

## 2014-07-17 MED ORDER — OXYCODONE HCL 5 MG PO TABS
5.0000 mg | ORAL_TABLET | ORAL | Status: DC | PRN
  Administered 2014-07-17 – 2014-07-20 (×12): 10 mg via ORAL
  Filled 2014-07-17 (×11): qty 2

## 2014-07-17 MED ORDER — NITROFURANTOIN MACROCRYSTAL 50 MG PO CAPS
50.0000 mg | ORAL_CAPSULE | Freq: Every day | ORAL | Status: DC
  Administered 2014-07-17 – 2014-07-19 (×3): 50 mg via ORAL
  Filled 2014-07-17 (×4): qty 1

## 2014-07-17 MED ORDER — METOCLOPRAMIDE HCL 5 MG/ML IJ SOLN
5.0000 mg | Freq: Three times a day (TID) | INTRAMUSCULAR | Status: DC | PRN
  Filled 2014-07-17: qty 2

## 2014-07-17 MED ORDER — ONDANSETRON HCL 4 MG/2ML IJ SOLN
INTRAMUSCULAR | Status: DC | PRN
  Administered 2014-07-17: 4 mg via INTRAVENOUS

## 2014-07-17 MED ORDER — DEXAMETHASONE SODIUM PHOSPHATE 10 MG/ML IJ SOLN
INTRAMUSCULAR | Status: DC | PRN
Start: 2014-07-17 — End: 2014-07-17
  Administered 2014-07-17: 4 mg via INTRAVENOUS

## 2014-07-17 MED ORDER — NEOSTIGMINE METHYLSULFATE 10 MG/10ML IV SOLN
INTRAVENOUS | Status: AC
  Filled 2014-07-17: qty 2

## 2014-07-17 MED ORDER — ONDANSETRON HCL 4 MG/2ML IJ SOLN
4.0000 mg | Freq: Four times a day (QID) | INTRAMUSCULAR | Status: DC | PRN
  Administered 2014-07-17: 4 mg via INTRAVENOUS
  Filled 2014-07-17 (×2): qty 2

## 2014-07-17 MED ORDER — MIDAZOLAM HCL 2 MG/2ML IJ SOLN
INTRAMUSCULAR | Status: AC
  Filled 2014-07-17: qty 2

## 2014-07-17 MED ORDER — PROPOFOL 10 MG/ML IV BOLUS
INTRAVENOUS | Status: DC | PRN
  Administered 2014-07-17: 50 mg via INTRAVENOUS
  Administered 2014-07-17: 150 mg via INTRAVENOUS

## 2014-07-17 MED ORDER — FENTANYL CITRATE 0.05 MG/ML IJ SOLN
INTRAMUSCULAR | Status: AC
  Filled 2014-07-17: qty 5

## 2014-07-17 MED ORDER — MENTHOL 3 MG MT LOZG: 1.0000 | LOZENGE | OROMUCOSAL | Status: DC | PRN

## 2014-07-17 MED ORDER — PHENTERMINE HCL 37.5 MG PO CAPS: 37.5000 mg | ORAL_CAPSULE | ORAL | Status: DC

## 2014-07-17 MED ORDER — MUPIROCIN 2 % EX OINT
TOPICAL_OINTMENT | CUTANEOUS | Status: AC
  Administered 2014-07-17: 11:00:00
  Filled 2014-07-17: qty 22

## 2014-07-17 MED ORDER — HYDROMORPHONE HCL PF 1 MG/ML IJ SOLN
1.0000 mg | INTRAMUSCULAR | Status: DC | PRN
  Administered 2014-07-17: 1 mg via INTRAVENOUS
  Filled 2014-07-17: qty 1

## 2014-07-17 MED ORDER — ONDANSETRON HCL 4 MG/2ML IJ SOLN
INTRAMUSCULAR | Status: AC
Start: 2014-07-17 — End: 2014-07-18
  Filled 2014-07-17: qty 2

## 2014-07-17 MED ORDER — PHENOL 1.4 % MT LIQD: 1.0000 | OROMUCOSAL | Status: DC | PRN

## 2014-07-17 MED ORDER — ASPIRIN EC 325 MG PO TBEC
325.0000 mg | DELAYED_RELEASE_TABLET | Freq: Two times a day (BID) | ORAL | Status: DC
  Administered 2014-07-17 – 2014-07-20 (×6): 325 mg via ORAL
  Filled 2014-07-17 (×8): qty 1

## 2014-07-17 MED ORDER — METHOCARBAMOL 500 MG PO TABS
500.0000 mg | ORAL_TABLET | Freq: Four times a day (QID) | ORAL | Status: DC | PRN
  Administered 2014-07-17 – 2014-07-18 (×2): 500 mg via ORAL
  Filled 2014-07-17 (×2): qty 1

## 2014-07-17 MED ORDER — ONDANSETRON HCL 4 MG PO TABS
4.0000 mg | ORAL_TABLET | Freq: Four times a day (QID) | ORAL | Status: DC | PRN
  Filled 2014-07-17: qty 1

## 2014-07-17 MED ORDER — LACTATED RINGERS IV SOLN
INTRAVENOUS | Status: DC
  Administered 2014-07-17: 11:00:00 via INTRAVENOUS

## 2014-07-17 MED ORDER — FENTANYL CITRATE 0.05 MG/ML IJ SOLN
INTRAMUSCULAR | Status: DC | PRN
  Administered 2014-07-17 (×3): 100 ug via INTRAVENOUS
  Administered 2014-07-17: 50 ug via INTRAVENOUS

## 2014-07-17 MED ORDER — FENTANYL CITRATE 0.05 MG/ML IJ SOLN
25.0000 ug | INTRAMUSCULAR | Status: DC | PRN
  Administered 2014-07-17: 50 ug via INTRAVENOUS
  Administered 2014-07-17: 25 ug via INTRAVENOUS
  Administered 2014-07-17: 50 ug via INTRAVENOUS
  Administered 2014-07-17: 25 ug via INTRAVENOUS

## 2014-07-17 MED ORDER — TRANEXAMIC ACID 100 MG/ML IV SOLN
1000.0000 mg | INTRAVENOUS | Status: AC
  Administered 2014-07-17: 1000 mg via INTRAVENOUS
  Filled 2014-07-17: qty 10

## 2014-07-17 MED ORDER — METHOCARBAMOL 500 MG PO TABS
ORAL_TABLET | ORAL | Status: AC
Start: 2014-07-17 — End: 2014-07-18
  Filled 2014-07-17: qty 1

## 2014-07-17 SURGICAL SUPPLY — 54 items
APL SKNCLS STERI-STRIP NONHPOA (GAUZE/BANDAGES/DRESSINGS) ×1
BENZOIN TINCTURE PRP APPL 2/3 (GAUZE/BANDAGES/DRESSINGS) ×3 IMPLANT
BLADE SAW SGTL 18X1.27X75 (BLADE) ×2 IMPLANT
BLADE SAW SGTL 18X1.27X75MM (BLADE) ×1
BLADE SURG ROTATE 9660 (MISCELLANEOUS) IMPLANT
BNDG GAUZE ELAST 4 BULKY (GAUZE/BANDAGES/DRESSINGS) IMPLANT
CAPT HIP PF COP ×2 IMPLANT
CELLS DAT CNTRL 66122 CELL SVR (MISCELLANEOUS) ×1 IMPLANT
CLOSURE WOUND 1/2 X4 (GAUZE/BANDAGES/DRESSINGS) ×2
COVER SURGICAL LIGHT HANDLE (MISCELLANEOUS) ×3 IMPLANT
DRAPE C-ARM 42X72 X-RAY (DRAPES) ×3 IMPLANT
DRAPE STERI IOBAN 125X83 (DRAPES) ×3 IMPLANT
DRAPE U-SHAPE 47X51 STRL (DRAPES) ×9 IMPLANT
DRSG AQUACEL AG ADV 3.5X10 (GAUZE/BANDAGES/DRESSINGS) ×3 IMPLANT
DURAPREP 26ML APPLICATOR (WOUND CARE) ×3 IMPLANT
ELECT BLADE 4.0 EZ CLEAN MEGAD (MISCELLANEOUS) ×3
ELECT BLADE 6.5 EXT (BLADE) ×2 IMPLANT
ELECT CAUTERY BLADE 6.4 (BLADE) ×3 IMPLANT
ELECT REM PT RETURN 9FT ADLT (ELECTROSURGICAL) ×3
ELECTRODE BLDE 4.0 EZ CLN MEGD (MISCELLANEOUS) IMPLANT
ELECTRODE REM PT RTRN 9FT ADLT (ELECTROSURGICAL) ×1 IMPLANT
FACESHIELD WRAPAROUND (MASK) ×6 IMPLANT
FACESHIELD WRAPAROUND OR TEAM (MASK) ×2 IMPLANT
GLOVE BIOGEL PI IND STRL 8 (GLOVE) ×2 IMPLANT
GLOVE BIOGEL PI INDICATOR 8 (GLOVE) ×4
GLOVE ECLIPSE 8.0 STRL XLNG CF (GLOVE) ×3 IMPLANT
GLOVE ORTHO TXT STRL SZ7.5 (GLOVE) ×6 IMPLANT
GOWN STRL REUS W/ TWL XL LVL3 (GOWN DISPOSABLE) ×2 IMPLANT
GOWN STRL REUS W/TWL XL LVL3 (GOWN DISPOSABLE) ×6
HANDPIECE INTERPULSE COAX TIP (DISPOSABLE) ×3
KIT BASIN OR (CUSTOM PROCEDURE TRAY) ×3 IMPLANT
KIT ROOM TURNOVER OR (KITS) ×3 IMPLANT
MANIFOLD NEPTUNE II (INSTRUMENTS) ×3 IMPLANT
NS IRRIG 1000ML POUR BTL (IV SOLUTION) ×3 IMPLANT
PACK TOTAL JOINT (CUSTOM PROCEDURE TRAY) ×3 IMPLANT
PAD ARMBOARD 7.5X6 YLW CONV (MISCELLANEOUS) ×6 IMPLANT
RETRACTOR WND ALEXIS 18 MED (MISCELLANEOUS) ×1 IMPLANT
RTRCTR WOUND ALEXIS 18CM MED (MISCELLANEOUS) ×3
SET HNDPC FAN SPRY TIP SCT (DISPOSABLE) ×1 IMPLANT
SPONGE LAP 18X18 X RAY DECT (DISPOSABLE) IMPLANT
SPONGE LAP 4X18 X RAY DECT (DISPOSABLE) IMPLANT
STRIP CLOSURE SKIN 1/2X4 (GAUZE/BANDAGES/DRESSINGS) ×4 IMPLANT
SUT ETHIBOND NAB CT1 #1 30IN (SUTURE) ×3 IMPLANT
SUT MNCRL AB 4-0 PS2 18 (SUTURE) ×3 IMPLANT
SUT VIC AB 0 CT1 27 (SUTURE) ×3
SUT VIC AB 0 CT1 27XBRD ANBCTR (SUTURE) ×1 IMPLANT
SUT VIC AB 1 CT1 27 (SUTURE) ×3
SUT VIC AB 1 CT1 27XBRD ANBCTR (SUTURE) ×1 IMPLANT
SUT VIC AB 2-0 CT1 27 (SUTURE) ×3
SUT VIC AB 2-0 CT1 TAPERPNT 27 (SUTURE) ×1 IMPLANT
TOWEL OR 17X24 6PK STRL BLUE (TOWEL DISPOSABLE) ×3 IMPLANT
TOWEL OR 17X26 10 PK STRL BLUE (TOWEL DISPOSABLE) ×3 IMPLANT
TRAY FOLEY CATH 16FRSI W/METER (SET/KITS/TRAYS/PACK) ×2 IMPLANT
WATER STERILE IRR 1000ML POUR (IV SOLUTION) ×6 IMPLANT

## 2014-07-17 NOTE — Anesthesia Procedure Notes (Signed)
Procedure Name: Intubation Date/Time: 07/17/2014 1:07 PM Performed by: Ollen Bowl Pre-anesthesia Checklist: Patient identified, Emergency Drugs available, Suction available, Patient being monitored and Timeout performed Patient Re-evaluated:Patient Re-evaluated prior to inductionOxygen Delivery Method: Circle system utilized and Simple face mask Preoxygenation: Pre-oxygenation with 100% oxygen Intubation Type: Combination inhalational/ intravenous induction Ventilation: Mask ventilation without difficulty Laryngoscope Size: Mac and 3 Grade View: Grade I Tube type: Oral Tube size: 7.0 mm Number of attempts: 1 Airway Equipment and Method: Patient positioned with wedge pillow Placement Confirmation: ETT inserted through vocal cords under direct vision,  positive ETCO2 and breath sounds checked- equal and bilateral Secured at: 22 cm Tube secured with: Tape Dental Injury: Teeth and Oropharynx as per pre-operative assessment  Comments: Performed by Dr Tresa Moore

## 2014-07-17 NOTE — Transfer of Care (Signed)
Immediate Anesthesia Transfer of Care Note  Patient: Jane [name]  Procedure(s) Performed: Procedure(s): LEFT TOTAL HIP ARTHROPLASTY ANTERIOR APPROACH (Left)  Patient Location: PACU  Anesthesia Type:General  Level of Consciousness: awake, alert  and oriented  Airway & Oxygen Therapy: Patient Spontanous Breathing and Patient connected to nasal cannula oxygen  Post-op Assessment: Report given to PACU RN and Post -op Vital signs reviewed and stable  Post vital signs: Reviewed and stable  Complications: No apparent anesthesia complications

## 2014-07-17 NOTE — Anesthesia Preprocedure Evaluation (Addendum)
Anesthesia Evaluation  Patient identified by MRN, date of birth, ID band Patient awake    Reviewed: Allergy & Precautions, H&P , NPO status , Patient's Chart, lab work & pertinent test results  Airway Mallampati: II      Dental  (+) Teeth Intact, Dental Advisory Given   Pulmonary pneumonia -, former smoker,          Cardiovascular     Neuro/Psych    GI/Hepatic GERD-  ,(+) Hepatitis -  Endo/Other  negative endocrine ROS  Renal/GU      Musculoskeletal   Abdominal   Peds  Hematology   Anesthesia Other Findings   Reproductive/Obstetrics                         Anesthesia Physical Anesthesia Plan  ASA: III  Anesthesia Plan: General   Post-op Pain Management:    Induction: Intravenous  Airway Management Planned: Oral ETT  Additional Equipment:   Intra-op Plan:   Post-operative Plan: Possible Post-op intubation/ventilation  Informed Consent: I have reviewed the patients History and Physical, chart, labs and discussed the procedure including the risks, benefits and alternatives for the proposed anesthesia with the patient or authorized representative who has indicated his/her understanding and acceptance.   Dental advisory given  Plan Discussed with: CRNA, Anesthesiologist and Surgeon  Anesthesia Plan Comments:        Anesthesia Quick Evaluation

## 2014-07-17 NOTE — H&P (Signed)
TOTAL HIP ADMISSION H&P  Patient is admitted for left total hip arthroplasty.  Subjective:  Chief Complaint: left hip pain  HPI: Jane [name], 63 y.o. female, has a history of pain and functional disability in the left hip(s) due to arthritis and patient has failed non-surgical conservative treatments for greater than 12 weeks to include NSAID's and/or analgesics, corticosteriod injections, use of assistive devices and activity modification.  Onset of symptoms was gradual starting 2 years ago with gradually worsening course since that time.The patient noted no past surgery on the left hip(s).  Patient currently rates pain in the left hip at 9 out of 10 with activity. Patient has night pain, worsening of pain with activity and weight bearing, trendelenberg gait, pain that interfers with activities of daily living and pain with passive range of motion. Patient has evidence of subchondral sclerosis, periarticular osteophytes and joint space narrowing by imaging studies. This condition presents safety issues increasing the risk of falls.  There is no current active infection.  Patient Active Problem List   Diagnosis Date Noted  . Arthritis of left hip 07/17/2014  . GERD (gastroesophageal reflux disease)   . HYPERTENSION 01/30/2010  . DIVERTICULOSIS-COLON 01/30/2010  . DIVERTICULITIS OF COLON 01/27/2010  . CHANGE IN BOWELS 01/21/2010  . ABDOMINAL PAIN-LLQ 01/21/2010  . ABDOMINAL PAIN, LOWER 01/21/2010  . UTI'S, HX OF 01/21/2010  . HYPERLIPIDEMIA 10/28/2007  . GERD 10/28/2007  . HIATAL HERNIA 10/28/2007  . IRRITABLE BOWEL SYNDROME 10/28/2007  . OSTEOARTHRITIS 10/28/2007  . OSTEOARTHRITIS 10/28/2007  . ARTHRITIS 10/28/2007  . COLONIC POLYPS, HX OF 10/28/2007   Past Medical History  Diagnosis Date  . GERD (gastroesophageal reflux disease)   . Heart murmur     when pregnant  . Pneumonia     hx  . History of recurrent UTIs     on  prophalatic med  . Arthritis   . Cancer     skin nose   . Hepatitis     75  from food    Past Surgical History  Procedure Laterality Date  . Cesarean section    . Total hip arthroplasty      Prescriptions prior to admission  Medication Sig Dispense Refill  . Cholecalciferol (VITAMIN D PO) Take 1 tablet by mouth daily.      Marland Kitchen ibuprofen (ADVIL,MOTRIN) 200 MG tablet Take 400 mg by mouth at bedtime.      . nitrofurantoin (MACRODANTIN) 50 MG capsule Take 50 mg by mouth at bedtime.      Marland Kitchen omeprazole (PRILOSEC) 40 MG capsule Take 40 mg by mouth daily.      . phentermine 37.5 MG capsule Take 37.5 mg by mouth every morning.      . polyethylene glycol (MIRALAX / GLYCOLAX) packet Take 17 g by mouth daily as needed for mild constipation.        Allergies  Allergen Reactions  . Codeine Other (See Comments)    Rapid heartbeat, shortness of breath    History  Substance Use Topics  . Smoking status: Former Smoker -- 0.25 packs/day for 15 years    Types: Cigarettes    Quit date: 11/05/2013  . Smokeless tobacco: Never Used  . Alcohol Use: Yes     Comment: 2 times monthly liquor    Family History  Problem Relation Age of Onset  . Colon cancer Neg Hx   . Pancreatic cancer Neg Hx   . Stomach cancer Neg Hx      Review of Systems  Musculoskeletal:  Positive for joint pain.  All other systems reviewed and are negative.   Objective:  Physical Exam  Constitutional: She is oriented to person, place, and time. She appears well-developed and well-nourished.  HENT:  Head: Normocephalic and atraumatic.  Eyes: EOM are normal. Pupils are equal, round, and reactive to light.  Neck: Normal range of motion. Neck supple.  Cardiovascular: Normal rate and regular rhythm.   Respiratory: Effort normal and breath sounds normal.  GI: Soft. Bowel sounds are normal.  Musculoskeletal:       Left hip: She exhibits decreased range of motion, decreased strength and bony tenderness.  Neurological: She is alert and oriented to person, place, and time.  Skin: Skin  is warm and dry.  Psychiatric: She has a normal mood and affect.    Vital signs in last 24 hours: Temp:  [98.2 F (36.8 C)] 98.2 F (36.8 C) (08/25 1051) Pulse Rate:  [80] 80 (08/25 1051) Resp:  [18] 18 (08/25 1051) BP: (168)/(71) 168/71 mmHg (08/25 1051) SpO2:  [99 %] 99 % (08/25 1051) Weight:  [78.926 kg (174 lb)] 78.926 kg (174 lb) (08/25 1051)  Labs:   Estimated body mass index is 31.82 kg/(m^2) as calculated from the following:   Height as of 07/06/14: 5\' 2"  (1.575 m).   Weight as of this encounter: 78.926 kg (174 lb).   Imaging Review Plain radiographs demonstrate severe degenerative joint disease of the left hip(s). The bone quality appears to be good for age and reported activity level.  Assessment/Plan:  End stage arthritis, left hip(s)  The patient history, physical examination, clinical judgement of the provider and imaging studies are consistent with end stage degenerative joint disease of the left hip(s) and total hip arthroplasty is deemed medically necessary. The treatment options including medical management, injection therapy, arthroscopy and arthroplasty were discussed at length. The risks and benefits of total hip arthroplasty were presented and reviewed. The risks due to aseptic loosening, infection, stiffness, dislocation/subluxation,  thromboembolic complications and other imponderables were discussed.  The patient acknowledged the explanation, agreed to proceed with the plan and consent was signed. Patient is being admitted for inpatient treatment for surgery, pain control, PT, OT, prophylactic antibiotics, VTE prophylaxis, progressive ambulation and ADL's and discharge planning.The patient is planning to be discharged home with home health services

## 2014-07-17 NOTE — Brief Op Note (Signed)
07/17/2014  2:55 PM  PATIENT:  Jane [name]  63 y.o. female  PRE-OPERATIVE DIAGNOSIS:  Left hip osteoarthritis  POST-OPERATIVE DIAGNOSIS:  Left hip osteoarthritis  PROCEDURE:  Procedure(s): LEFT TOTAL HIP ARTHROPLASTY ANTERIOR APPROACH (Left)  SURGEON:  Surgeon(s) and Role:    * Mcarthur Rossetti, MD - Primary    * Meredith Pel, MD - Assisting  PHYSICIAN ASSISTANT:   ASSISTANTS: Benita Stabile, PA-C   ANESTHESIA:   general  EBL:  Total I/O In: 1000 [I.V.:1000] Out: 550 [Urine:200; Blood:350]  BLOOD ADMINISTERED:none  DRAINS: none   LOCAL MEDICATIONS USED:  NONE  SPECIMEN:  No Specimen  DISPOSITION OF SPECIMEN:  N/A  COUNTS:  YES  TOURNIQUET:  * No tourniquets in log *  DICTATION: .Other Dictation: Dictation Number [id number]  PLAN OF CARE: Admit to inpatient   PATIENT DISPOSITION:  PACU - hemodynamically stable.   Delay start of Pharmacological VTE agent (>24hrs) due to surgical blood loss or risk of bleeding: no

## 2014-07-17 NOTE — Op Note (Signed)
[name], [name] NO.:  [id number]  MEDICAL RECORD NO.:  [id number]  LOCATION:  5N29C                        FACILITY:  [facility]  PHYSICIAN:  Lind Guest. Ninfa Linden, M.D.DATE OF BIRTH:  [birth year]  DATE OF PROCEDURE:  07/17/2014 DATE OF DISCHARGE:                              OPERATIVE REPORT   PREOPERATIVE DIAGNOSIS:  Severe degenerative joint disease and osteoarthritis, left hip.  POSTOPERATIVE DIAGNOSIS:  Severe degenerative joint disease and osteoarthritis, left hip.  PROCEDURE:  Left total hip arthroplasty direct anterior approach.  IMPLANTS:  DePuy Sector Gription acetabular component size 48, apex hole eliminator guide, size 32+4 neutral polyethylene liner, size 11 Corail femoral component with standard offset, size 32+1 ceramic hip ball.  SURGEON:  Lind Guest. Ninfa Linden, M.D.  ASSISTANT SURGEON:  Alphonzo Severance, M.D.  PHYSICIAN ASSISTANT:  Erskine Emery, PA-C  ANESTHESIA:  General.  ESTIMATED BLOOD LOSS:  250 mL.  ANTIBIOTICS:  2 g IV Ancef and 900 mg of IV clindamycin.  COMPLICATIONS:  None.  INDICATIONS:  Jane [name] is a very pleasant 63 year old female with debilitating hip pain involving her left hip.  She has known osteoarthritis in that hip and also had a total hip arthroplasty on the right side many years ago by Dr. Marlou Sa.  She has done well with that hip through a posterior approach, but at this point with debilitating arthritis involving her left hip and its impact on her activities of daily living and her mobility, she wished to proceed with a hip replaced on the left side.  This has been recommended to her given her loss of mobility, her daily pain, and her x-rays showing severe osteoarthritis of her left hip.  She understands the risk of acute blood loss anemia, nerve and vessel injury, fracture, infection, dislocation, and DVT.  She understands the goals are decreased pain, improved mobility, and overall improved quality of  life.  PROCEDURE IN DETAIL:  After informed consent was obtained, appropriate left hip was marked.  She was brought to the operating room and general anesthesia was obtained while she was on her stretcher.  Foley catheter was placed and both feet had traction boots applied.  Next, she was placed supine on the Hana fracture table with perineal post in place and both legs in inline skeletal traction devices, but no traction applied. Her left operative hip was prepped and draped with DuraPrep and sterile drapes.  Time-out was called and she was identified as correct patient, correct left hip.  We then made an incision inferior and posterior to the anterior-superior iliac spine and carried this obliquely down the leg.  We dissected down to the tensor fascia lata muscle and then tensor fascia was divided longitudinally so we could proceed with a direct anterior approach to the hip.  We placed Cobra retractors around the lateral neck and up underneath the rectus femoris around the medial neck.  We cauterized the lateral femoral circumflex vessels and then opened the hip capsule in a L-type format placing the Cobra retractors within the hip capsule.  We then made a femoral neck cut proximal to the lesser trochanter using oscillating saw and completed this an osteotome. I placed a corkscrew guide  in the femoral head and removed the femoral head in its entirety and found to be devoid of cartilage.  We then cleaned the acetabular remnants of acetabular labrum and the debris.  We placed a bent Hohmann along the medial acetabular rim and a Cobra retractor laterally.  We then cleaned remnants of tissue from the foveal area of the acetabulum.  I then began reaming in 2 mm increments sequentially from a size 42 reamer up to a size 48 with all reamers under direct visualization and last reamer also direct fluoroscopy, so we could obtain our depth of reaming, our inclination, and anteversion. Once I was  pleased with this, I placed the real DePuy Sector Gription acetabular component size 48, the apex hole eliminator guide and a 32+4 neutral polyethylene liner for a size 48 acetabular component. Attention was then turned to the femur with the leg externally rotated to 100 degrees, extended and adducted.  We were able to place a Mueller retractor medially and a Hohmann retractor behind the greater trochanter.  I released the lateral joint capsule and some of the remnants behind the greater trochanter as well as capsular tissue.  We were able to bring leg up a little bit higher, we used a rongeur to lateralize the box cutting osteotome to enter the femoral canal.  I then began broaching from a size 8 broach in the Corail broaching system up to a size 11.  The size 11 had a nice tight fit.  So, we trialed a standard neck and a 32+1 hip ball.  We then brought the leg back over and up with traction and internal rotation, we reduced the pelvis and it was stable past 90 degrees of external rotation and 45 degrees of internal rotation and had minimal Shuck.  Her offset and leg lengths were measured near equal under direct fluoroscopy.  We then dislocated the hip and removed the trial components.  I lateralized this little bit more and then placed the real Corail femoral component size 11 with standard offset, the real 32+1 hip ball as well.  We then reduced this back in the acetabulum and it was stable.  We copiously irrigated the soft tissue with normal saline solution using pulsatile lavage.  We then closed the joint capsule with interrupted #1 Ethibond suture followed by a running #1 Vicryl in the tensor fascia, 0 Vicryl in the deep tissue, 2- 0 Vicryl in subcutaneous tissue, 4-0 Monocryl subcuticular stitch, and Steri-Strips on the skin.  An Aquacel dressing was applied.  She was then taken off the Hana table, awakened, extubated, and taken to recovery room in stable condition.  All final  counts were correct. There were no complications noted.     Lind Guest. Ninfa Linden, M.D.     CYB/MEDQ  D:  07/17/2014  T:  07/17/2014  Job:  [id number]

## 2014-07-18 ENCOUNTER — Encounter (HOSPITAL_COMMUNITY): Payer: Self-pay | Admitting: Orthopaedic Surgery

## 2014-07-18 LAB — BASIC METABOLIC PANEL
Anion gap: 11 (ref 5–15)
BUN: 15 mg/dL (ref 6–23)
CALCIUM: 8.8 mg/dL (ref 8.4–10.5)
CO2: 27 meq/L (ref 19–32)
Chloride: 101 mEq/L (ref 96–112)
Creatinine, Ser: 0.53 mg/dL (ref 0.50–1.10)
GFR calc Af Amer: >90 mL/min (ref >90)
GLUCOSE: 130 mg/dL — AB (ref 70–99)
Potassium: 5.1 mEq/L (ref 3.7–5.3)
SODIUM: 139 meq/L (ref 137–147)

## 2014-07-18 LAB — CBC
HEMATOCRIT: 38.2 % (ref 36.0–46.0)
HEMOGLOBIN: 12.9 g/dL (ref 12.0–15.0)
MCH: 30.9 pg (ref 26.0–34.0)
MCHC: 33.8 g/dL (ref 30.0–36.0)
MCV: 91.6 fL (ref 78.0–100.0)
Platelets: 222 10*3/uL (ref 150–400)
RBC: 4.17 MIL/uL (ref 3.87–5.11)
RDW: 12.9 % (ref 11.5–15.5)
WBC: 15.4 10*3/uL — ABNORMAL HIGH (ref 4.0–10.5)

## 2014-07-18 NOTE — Evaluation (Addendum)
Occupational Therapy Evaluation Patient Details Name: Jane [name] MRN: [id number] DOB: 12-01-1950 Today's Date: 07/18/2014    History of Present Illness s/p Lt THA (anterior) 07/17/14   Clinical Impression   Pt with decline in function and safety with ADLs and ADL mobility. Pt became nauseous during ambulation to the bathroom and vomited in trach can. Pt would benefit from acute OT services to address impairments to increase level of function and safety    Follow Up Recommendations  Home health OT;Supervision/Assistance - 24 hour    Equipment Recommendations  None recommended by OT (pt has DME and A/E at home)    Recommendations for Other Services       Precautions / Restrictions Precautions Precautions: Fall Precaution Comments: anterior- no precautions  Restrictions Weight Bearing Restrictions: Yes LLE Weight Bearing: Weight bearing as tolerated      Mobility Bed Mobility Overal bed mobility: Needs Assistance Bed Mobility: Sit to Supine     Supine to sit: Min assist     General bed mobility comments: (A) to advance L LE onto bed; cues for hand placement and requried use of handrails   Transfers Overall transfer level: Needs assistance Equipment used: Rolling walker (2 wheeled) Transfers: Sit to/from Stand Sit to Stand: Mod assist         General transfer comment: cues for hand placement, assist to power up    Balance Overall balance assessment: Needs assistance Sitting-balance support: No upper extremity supported;Feet supported Sitting balance-Leahy Scale: Good Sitting balance - Comments: sat EOB ~5 min for dizziness to subside    Standing balance support: During functional activity;Bilateral upper extremity supported;Single extremity supported Standing balance-Leahy Scale: Poor Standing balance comment: relies heavily on UE support for balance                             ADL Overall ADL's : Needs assistance/impaired     Grooming:  Wash/dry hands;Wash/dry face;Minimal assistance;Standing   Upper Body Bathing: Set up;Supervision/ safety;Sitting   Lower Body Bathing: Maximal assistance   Upper Body Dressing : Set up;Supervision/safety;Sitting   Lower Body Dressing: Maximal assistance   Toilet Transfer: Moderate assistance;RW;Comfort height toilet;Grab bars;Cueing for safety Toilet Transfer Details (indicate cue type and reason): cues for hand placement, assist to power up [place] and Hygiene: Moderate assistance;Sit to/from stand   Tub/ Banker: Moderate assistance;3 in 1;Grab bars;Cueing for safety   Functional mobility during ADLs: Moderate assistance;Cueing for safety General ADL Comments: Pt has ADL A/E kit at home     Vision  wears glasses at all times                   Perception Perception Perception Tested?: No   Praxis Praxis Praxis tested?: Not tested    Pertinent Vitals/Pain Pain Assessment: 0-10 Pain Score: 5  Pain Location: Lt hip (surgical site) Pain Descriptors / Indicators: Aching Pain Intervention(s): Limited activity within patient's tolerance;Monitored during session;Repositioned;Premedicated before session     Hand Dominance Right   Extremity/Trunk Assessment Upper Extremity Assessment Upper Extremity Assessment: Overall WFL for tasks assessed   Lower Extremity Assessment Lower Extremity Assessment: Defer to PT evaluation LLE Deficits / Details: hip 3-/5 LLE: Unable to fully assess due to pain LLE Sensation:  (WFL to light touch)   Cervical / Trunk Assessment Cervical / Trunk Assessment: Normal   Communication Communication Communication: No difficulties   Cognition Arousal/Alertness: Awake/alert Behavior During Therapy: WFL for tasks assessed/performed Overall Cognitive  Status: Within Functional Limits for tasks assessed                     General Comments   Pt pleasant and cooperative                 Home  Living Family/patient expects to be discharged to:: Private residence Living Arrangements: Spouse/significant other Available Help at Discharge: Friend(s);Family;Available 24 hours/day Type of Home: House Home Access: Stairs to enter [employer] of Steps: 1-2 Entrance Stairs-Rails: None Home Layout: One level     Bathroom Shower/Tub: Occupational psychologist: Handicapped height     Home Equipment: Stewart - single point;Walker - 4 wheels;Shower seat - built in;Toilet riser;Hand held shower head;Adaptive equipment Adaptive Equipment: Sock aid;Long-handled shoe horn;Long-handled sponge;Reacher Additional Comments: pt has walk in shower with seat      Prior Functioning/Environment Level of Independence: Independent with assistive device(s)        Comments: ambulating with cane as needed due to pain     OT Diagnosis: Acute pain   OT Problem List: Decreased knowledge of use of DME or AE;Decreased activity tolerance;Pain;Impaired balance (sitting and/or standing)   OT Treatment/Interventions: Self-care/ADL training;Therapeutic exercise;Patient/family education;Neuromuscular education;Balance training;Therapeutic activities;DME and/or AE instruction    OT Goals(Current goals can be found in the care plan section) Acute Rehab OT Goals Patient Stated Goal: to go home OT Goal Formulation: With patient/family Time For Goal Achievement: 07/25/14 Potential to Achieve Goals: Good ADL Goals Pt Will Perform Grooming: with min guard assist;standing Pt Will Perform Lower Body Bathing: with mod assist;with caregiver independent in assisting;with adaptive equipment Pt Will Perform Lower Body Dressing: with mod assist;with caregiver independent in assisting;with adaptive equipment Pt Will Transfer to Toilet: with min assist;with min guard assist;ambulating;grab bars (3 in 1) Pt Will Perform Toileting - Clothing Manipulation and hygiene: with min assist;sit to/from stand Pt  Will Perform Tub/Shower Transfer: with min assist;shower seat;with min guard assist  OT Frequency: Min 2X/week   Barriers to D/C:    none       Co-evaluation              End of Session Equipment Utilized During Treatment: Gait belt;Rolling walker;Other (comment) (3 in 1)  Activity Tolerance: Other (comment) (pt nauseous) Patient left: in bed;with call bell/phone within reach;with family/visitor present   Time: 1040-1107 OT Time Calculation (min): 27 min Charges:  OT General Charges $OT Visit: 1 Procedure OT Evaluation $Initial OT Evaluation Tier I: 1 Procedure OT Treatments $Therapeutic Activity: 8-22 mins G-Codes:    Britt Bottom 07/18/2014, 11:52 AM

## 2014-07-18 NOTE — Progress Notes (Signed)
Subjective: 1 Day Post-Op Procedure(s) (LRB): LEFT TOTAL HIP ARTHROPLASTY ANTERIOR APPROACH (Left) Patient reports pain as moderate.    Objective: Vital signs in last 24 hours: Temp:  [97.5 F (36.4 C)-99.6 F (37.6 C)] 97.6 F (36.4 C) (08/26 0500) Pulse Rate:  [80-104] 94 (08/26 0500) Resp:  [5-19] 16 (08/26 0500) BP: (116-168)/(59-95) 159/84 mmHg (08/26 0500) SpO2:  [98 %-100 %] 100 % (08/26 0500) Weight:  [78.9 kg (173 lb 15.1 oz)-78.926 kg (174 lb)] 78.9 kg (173 lb 15.1 oz) (08/25 1830)  Intake/Output from previous day: 08/25 0701 - 08/26 0700 In: 2367.5 [P.O.:480; I.V.:1887.5] Out: 1000 [Urine:650; Blood:350] Intake/Output this shift:     Recent Labs  07/18/14 0457  HGB 12.9    Recent Labs  07/18/14 0457  WBC 15.4*  RBC 4.17  HCT 38.2  PLT 222    Recent Labs  07/18/14 0457  NA 139  K 5.1  CL 101  CO2 27  BUN 15  CREATININE 0.53  GLUCOSE 130*  CALCIUM 8.8    Recent Labs  07/17/14 1114  INR 1.07    Sensation intact distally Intact pulses distally Dorsiflexion/Plantar flexion intact Incision: dressing C/D/I  Assessment/Plan: 1 Day Post-Op Procedure(s) (LRB): LEFT TOTAL HIP ARTHROPLASTY ANTERIOR APPROACH (Left) Up with therapy, WBAT, no hip precautions  BLACKMAN,CHRISTOPHER Y 07/18/2014, 7:25 AM

## 2014-07-18 NOTE — Plan of Care (Signed)
Problem: Consults Goal: Diagnosis- Total Joint Replacement Outcome: Completed/Met Date Met:  07/18/14 Primary Total Hip Left

## 2014-07-18 NOTE — Evaluation (Signed)
Physical Therapy Evaluation Patient Details Name: Jane [name] MRN: [id number] DOB: 07-Nov-1951 Today's Date: 07/18/2014   History of Present Illness  s/p Lt THA (anterior) 07/17/14  Clinical Impression  Pt is s/p Lt THA POD#1 resulting in the deficits listed below (see PT Problem List).  Pt will benefit from skilled PT to increase their independence and safety with mobility to allow discharge to the venue listed below. Pt limited in evaluation due to dizziness. Very motivated to progress with therapy and return home with family.      Follow Up Recommendations Home health PT;Supervision/Assistance - 24 hour    Equipment Recommendations  Rolling walker with 5" wheels    Recommendations for Other Services OT consult     Precautions / Restrictions Precautions Precautions: Fall Precaution Comments: anterior- no precautions  Restrictions Weight Bearing Restrictions: Yes LLE Weight Bearing: Weight bearing as tolerated      Mobility  Bed Mobility Overal bed mobility: Needs Assistance Bed Mobility: Supine to Sit     Supine to sit: Min assist;HOB elevated     General bed mobility comments: (A) to advance Lt LE to/off EOB; cues for hand placement and requried use of handrails   Transfers Overall transfer level: Needs assistance Equipment used: Rolling walker (2 wheeled) Transfers: Sit to/from Stand Sit to Stand: Mod assist         General transfer comment: pt with difficulty powering up; ;requried mod (A) with use of gt belt to support pt; cues for hand placement and sequencing with RW  Ambulation/Gait Ambulation/Gait assistance: Min assist Ambulation Distance (Feet): 4 Feet Assistive device: Rolling walker (2 wheeled) Gait Pattern/deviations: Step-to pattern;Decreased step length - right;Decreased stance time - left;Antalgic;Narrow base of support;Trunk flexed Gait velocity: decreased Gait velocity interpretation: at or above normal speed for age/gender General Gait  Details: pt limited by dizziness; cues for sequencing and hand placement   Stairs            Wheelchair Mobility    Modified Rankin (Stroke Patients Only)       Balance Overall balance assessment: Needs assistance Sitting-balance support: Feet supported;No upper extremity supported Sitting balance-Leahy Scale: Fair Sitting balance - Comments: sat EOB ~5 min for dizziness to subside    Standing balance support: During functional activity;Bilateral upper extremity supported Standing balance-Leahy Scale: Poor Standing balance comment: relies heavily on UE support for balance                              Pertinent Vitals/Pain Pain Assessment: 0-10 Pain Score: 6  Pain Location: Lt hip (surgical site) Pain Descriptors / Indicators: Tightness Pain Intervention(s): Limited activity within patient's tolerance;Monitored during session;Repositioned    Home Living Family/patient expects to be discharged to:: Private residence Living Arrangements: Spouse/significant other Available Help at Discharge: Friend(s);Family;Available 24 hours/day Type of Home: House Home Access: Stairs to enter Entrance Stairs-Rails: None Entrance Stairs-Number of Steps: 1-2 (threshold ) Home Layout: One level Home Equipment: Cane - single point;Walker - 4 wheels Additional Comments: pt has walk in shower with seat    Prior Function Level of Independence: Independent with assistive device(s)         Comments: ambulating with cane as needed due to pain      Hand Dominance   Dominant Hand: Right    Extremity/Trunk Assessment   Upper Extremity Assessment: Defer to OT evaluation           Lower Extremity Assessment: LLE deficits/detail  LLE Deficits / Details: hip 3-/5  Cervical / Trunk Assessment: Normal  Communication   Communication: No difficulties  Cognition Arousal/Alertness: Awake/alert Behavior During Therapy: WFL for tasks assessed/performed Overall Cognitive  Status: Within Functional Limits for tasks assessed                      General Comments      Exercises        Assessment/Plan    PT Assessment Patient needs continued PT services  PT Diagnosis Abnormality of gait;Generalized weakness;Acute pain   PT Problem List Decreased strength;Decreased activity tolerance;Decreased balance;Decreased mobility;Decreased range of motion;Decreased knowledge of use of DME;Pain  PT Treatment Interventions DME instruction;Gait training;Stair training;Functional mobility training;Therapeutic exercise;Balance training;Therapeutic activities;Neuromuscular re-education;Patient/family education   PT Goals (Current goals can be found in the Care Plan section) Acute Rehab PT Goals Patient Stated Goal: to go home PT Goal Formulation: With patient Time For Goal Achievement: 07/22/14 Potential to Achieve Goals: Good    Frequency 7X/week   Barriers to discharge        Co-evaluation               End of Session Equipment Utilized During Treatment: Gait belt Activity Tolerance: Patient tolerated treatment well Patient left: in chair;with call bell/phone within reach Nurse Communication: Mobility status;Precautions         Time: 4854-6270 PT Time Calculation (min): 20 min   Charges:   PT Evaluation $Initial PT Evaluation Tier I: 1 Procedure PT Treatments $Gait Training: 8-22 mins   PT G CodesGustavus Bryant, [place]  [phone] 07/18/2014, 10:49 AM

## 2014-07-18 NOTE — Progress Notes (Signed)
Physical Therapy Treatment Patient Details Name: Jane [name] MRN: [id number] DOB: 10/02/1951 Today's Date: 07/18/2014    History of Present Illness s/p Lt THA (anterior) 07/17/14    PT Comments    Pt continues to be limited in mobility due to dizziness and nausea. Pt progressing with thera ex for LE strengthening and is motivated to participate in therapy. Will cont to follow per POC.   Follow Up Recommendations  Home health PT;Supervision/Assistance - 24 hour     Equipment Recommendations  Rolling walker with 5" wheels    Recommendations for Other Services OT consult     Precautions / Restrictions Precautions Precautions: Fall Precaution Comments: anterior- no precautions  Restrictions Weight Bearing Restrictions: Yes LLE Weight Bearing: Weight bearing as tolerated    Mobility  Bed Mobility Overal bed mobility: Needs Assistance Bed Mobility: Supine to Sit     Supine to sit: Min assist;HOB elevated     General bed mobility comments: pt educated on using sheet for UE (A); continued to require (A) to advance Lt LE and limited due to pain   Transfers Overall transfer level: Needs assistance Equipment used: Rolling walker (2 wheeled) Transfers: Sit to/from Stand Sit to Stand: Min assist         General transfer comment: cues for hand placement and sequencing; min (A) from lower surface (ie bed); min guard from higher surface (Ie toilet) with UE bracing  Ambulation/Gait Ambulation/Gait assistance: Min guard Ambulation Distance (Feet): 18 Feet Assistive device: Rolling walker (2 wheeled) Gait Pattern/deviations: Step-through pattern;Decreased stance time - left;Decreased step length - right;Antalgic Gait velocity: decreased Gait velocity interpretation: Below normal speed for age/gender General Gait Details: pt cont to c/o dizziness and nausea with incr ambulation; min guard for safety primarily with turns   Stairs            Wheelchair Mobility     Modified Rankin (Stroke Patients Only)       Balance Overall balance assessment: Needs assistance Sitting-balance support: Feet supported;No upper extremity supported Sitting balance-Leahy Scale: Good     Standing balance support: During functional activity;Bilateral upper extremity supported Standing balance-Leahy Scale: Fair Standing balance comment: pt able to stand at sinke to perform ADLs without UE bracing for short period of time                     Cognition Arousal/Alertness: Awake/alert Behavior During Therapy: WFL for tasks assessed/performed Overall Cognitive Status: Within Functional Limits for tasks assessed                      Exercises Total Joint Exercises Ankle Circles/Pumps: AROM;Both;10 reps;Seated Quad Sets: AROM;Strengthening;Both;10 reps;Seated Hip ABduction/ADduction: AAROM;Left;10 reps;Seated Long Arc Quad: AROM;Strengthening;Left;10 reps;Seated    General Comments        Pertinent Vitals/Pain Pain Assessment: 0-10 Pain Score: 7  Pain Location: Lt hip Pain Descriptors / Indicators: Sore Pain Intervention(s): Limited activity within patient's tolerance;Monitored during session;Premedicated before session;Repositioned    Home Living                      Prior Function            PT Goals (current goals can now be found in the care plan section) Acute Rehab PT Goals Patient Stated Goal: to go home tomorrow PT Goal Formulation: With patient Time For Goal Achievement: 07/22/14 Potential to Achieve Goals: Good Progress towards PT goals: Progressing toward goals    Frequency  7X/week  PT Plan Current plan remains appropriate    Co-evaluation             End of Session Equipment Utilized During Treatment: Gait belt Activity Tolerance: Other (comment) (limited by dizziness/nausea ) Patient left: in chair;with call bell/phone within reach     Time: 2703-5009 PT Time Calculation (min): 24  min  Charges:  $Gait Training: 8-22 mins $Therapeutic Exercise: 8-22 mins                    G CodesElie Confer [place], [place] 07/18/2014, 4:24 PM

## 2014-07-18 NOTE — Care Management Note (Signed)
CARE MANAGEMENT NOTE 07/18/2014  Patient:  Jane [name], Jane [name]   Account Number:  [id number]  Date Initiated:  07/18/2014  Documentation initiated by:  Ricki Miller  Subjective/Objective Assessment:   63 yr old female admitted with left hip osteoarthritis. S/p left total hip arthroplasty.     Action/Plan:   Patient preoperatively setup with [facility], no changes.   Anticipated DC Date:  07/19/2014   Anticipated DC Plan:  [place]  CM consult      PAC Choice  [place]   Choice offered to / List presented to:  C-1 Patient   DME arranged  [facility]  3-N-1      DME agency  [employer]     [place] arranged  HH-2 PT      [place] agency  [facility]   Status of service:  Completed, signed off Medicare Important Message given?   (If response is "NO", the following Medicare IM given date fields will be blank) Date Medicare IM given:   Medicare IM given by:   Date Additional Medicare IM given:   Additional Medicare IM given by:    Discharge Disposition:  [place]  Per UR Regulation:  Reviewed for med. necessity/level of care/duration of stay  If discussed at [place] of Stay Meetings, dates discussed:    Comments:

## 2014-07-18 NOTE — Progress Notes (Signed)
Utilization review completed.  

## 2014-07-19 LAB — CBC
HEMATOCRIT: 35.4 % — AB (ref 36.0–46.0)
Hemoglobin: 11.8 g/dL — ABNORMAL LOW (ref 12.0–15.0)
MCH: 31.4 pg (ref 26.0–34.0)
MCHC: 33.3 g/dL (ref 30.0–36.0)
MCV: 94.1 fL (ref 78.0–100.0)
PLATELETS: 192 10*3/uL (ref 150–400)
RBC: 3.76 MIL/uL — AB (ref 3.87–5.11)
RDW: 13.2 % (ref 11.5–15.5)
WBC: 11.6 10*3/uL — AB (ref 4.0–10.5)

## 2014-07-19 MED ORDER — METHOCARBAMOL 500 MG PO TABS: 500.0000 mg | ORAL_TABLET | Freq: Four times a day (QID) | ORAL | Status: DC | PRN

## 2014-07-19 MED ORDER — ASPIRIN 325 MG PO TBEC: 325.0000 mg | DELAYED_RELEASE_TABLET | Freq: Two times a day (BID) | ORAL | Status: DC

## 2014-07-19 MED ORDER — OXYCODONE-ACETAMINOPHEN 5-325 MG PO TABS: 1.0000 | ORAL_TABLET | ORAL | Status: DC | PRN

## 2014-07-19 NOTE — Progress Notes (Signed)
Physical Therapy Treatment Patient Details Name: Jane [name] MRN: [id number] DOB: 06-08-1951 Today's Date: 07/19/2014    History of Present Illness s/p Lt THA (anterior) 07/17/14    PT Comments    Patient progressing well with ambulation this session and no complaints of dizziness. Will continue with ambulation and patient planning for DC on Friday  Follow Up Recommendations  Home health PT;Supervision/Assistance - 24 hour     Equipment Recommendations  Rolling walker with 5" wheels    Recommendations for Other Services       Precautions / Restrictions Precautions Precaution Comments: anterior- no precautions  Restrictions LLE Weight Bearing: Weight bearing as tolerated    Mobility  Bed Mobility Overal bed mobility: Needs Assistance Bed Mobility: Sit to Supine       Sit to supine: Min assist   General bed mobility comments: A for LE back into bed due to pain. Cues for positioning  Transfers Overall transfer level: Needs assistance Equipment used: Rolling walker (2 wheeled)   Sit to Stand: Min assist         General transfer comment: A to ensure balance. Patient with good technique  Ambulation/Gait Ambulation/Gait assistance: Min guard Ambulation Distance (Feet): 80 Feet Assistive device: Rolling walker (2 wheeled) Gait Pattern/deviations: Step-through pattern;Decreased stride length Gait velocity: decreased   General Gait Details: Patient with good technique and safe use of RW. No dizziness this session   Stairs            Wheelchair Mobility    Modified Rankin (Stroke Patients Only)       Balance                                    Cognition Arousal/Alertness: Awake/alert Behavior During Therapy: WFL for tasks assessed/performed Overall Cognitive Status: Within Functional Limits for tasks assessed                      Exercises Total Joint Exercises Quad Sets: AROM;Both;10 reps Heel Slides: AAROM;Left;10  reps Hip ABduction/ADduction: AAROM;Left;10 reps Long Arc Quad: AROM;Left;10 reps    General Comments        Pertinent Vitals/Pain Pain Score: 6  Pain Location: Lt hip Pain Descriptors / Indicators: Sore Pain Intervention(s): Limited activity within patient's tolerance;Monitored during session    Home Living                      Prior Function            PT Goals (current goals can now be found in the care plan section) Progress towards PT goals: Progressing toward goals    Frequency  7X/week    PT Plan Current plan remains appropriate    Co-evaluation             End of Session Equipment Utilized During Treatment: Gait belt   Patient left: with call bell/phone within reach;in bed     Time: 3007-6226 PT Time Calculation (min): 26 min  Charges:  $Gait Training: 8-22 mins $Therapeutic Exercise: 8-22 mins                    G Codes:      Jacqualyn Posey 07/19/2014, 8:28 AM  07/19/2014 Jacqualyn Posey PTA [phone] pager [phone] office

## 2014-07-19 NOTE — Progress Notes (Signed)
Physical Therapy Treatment Patient Details Name: Jane [name] MRN: [id number] DOB: June 10, 1951 Today's Date: 07/19/2014    History of Present Illness s/p Lt THA (anterior) 07/17/14    PT Comments    Patient ambulating much better this afternoon and able to complete stair training this session. Patient is planning to DC home tomorrow after session of PT.   Follow Up Recommendations  Home health PT;Supervision/Assistance - 24 hour     Equipment Recommendations  Rolling walker with 5" wheels    Recommendations for Other Services       Precautions / Restrictions Precautions Precautions: Fall Precaution Comments: anterior- no precautions  Restrictions Weight Bearing Restrictions: Yes LLE Weight Bearing: Weight bearing as tolerated    Mobility  Bed Mobility                  Transfers Overall transfer level: Needs assistance Equipment used: Rolling walker (2 wheeled)   Sit to Stand: Min guard         General transfer comment:  Patient with good technique  Ambulation/Gait Ambulation/Gait assistance: Supervision Ambulation Distance (Feet): 300 Feet Assistive device: Rolling walker (2 wheeled) Gait Pattern/deviations: Step-through pattern;Decreased stride length     General Gait Details: Patient with good technique and safe use of RW. No dizziness this session   Stairs Stairs: Yes Stairs assistance: Min guard Stair Management: Step to pattern;Forwards;With walker;No rails Number of Stairs: 1 General stair comments: Patient education on safe technique and sequency  Wheelchair Mobility    Modified Rankin (Stroke Patients Only)       Balance                                    Cognition Arousal/Alertness: Awake/alert Behavior During Therapy: WFL for tasks assessed/performed Overall Cognitive Status: Within Functional Limits for tasks assessed                      Exercises Total Joint Exercises Quad Sets: AROM;Both;10  reps Heel Slides: AAROM;Left;10 reps Hip ABduction/ADduction: AAROM;Left;10 reps Long Arc Quad: AROM;Left;10 reps    General Comments        Pertinent Vitals/Pain Pain Assessment: No/denies pain    Home Living                      Prior Function            PT Goals (current goals can now be found in the care plan section) Progress towards PT goals: Progressing toward goals    Frequency  7X/week    PT Plan Current plan remains appropriate    Co-evaluation             End of Session Equipment Utilized During Treatment: Gait belt Activity Tolerance: Patient tolerated treatment well Patient left: in chair;with call bell/phone within reach     Time: 1125-1148 PT Time Calculation (min): 23 min  Charges:  $Gait Training: 8-22 mins $Therapeutic Exercise: 8-22 mins                    G Codes:      Jacqualyn Posey 07/19/2014, 12:50 PM  07/19/2014 Jacqualyn Posey PTA [phone] pager [phone] office

## 2014-07-19 NOTE — Progress Notes (Signed)
Occupational Therapy Treatment and Discharge Patient Details Name: Jane [name] MRN: [id number] DOB: 14-Dec-1950 Today's Date: 07/19/2014    History of present illness s/p Lt THA ( direct anterior) 07/17/14   OT comments  This 63 yo female has completed all her OT education and she/husband are without further concerns/questions about BADLs. We will sign off.  Follow Up Recommendations  No OT follow up;Supervision - Intermittent    Equipment Recommendations  None recommended by OT (has all needed DME)       Precautions / Restrictions Precautions Precautions: Fall Precaution Comments: direct anterior Restrictions Weight Bearing Restrictions: No LLE Weight Bearing: Weight bearing as tolerated       Mobility Bed Mobility Overal bed mobility: Needs Assistance Bed Mobility: Supine to Sit     Supine to sit: Min assist;HOB elevated        Transfers Overall transfer level: Needs assistance Equipment used: Rolling walker (2 wheeled) Transfers: Sit to/from Stand Sit to Stand: Min guard         General transfer comment:  Patient with good technique        ADL Overall ADL's : Needs assistance/impaired     Grooming: Wash/dry hands;Min Dispensing optician: Min guard;Ambulation;RW;Comfort height toilet (with 3n1 over it)   Toileting- Clothing Manipulation and Hygiene: Min guard;Sit to/from stand   Tub/ Shower Transfer: Walk-in shower;Min guard;Ambulation;Rolling walker;Shower seat (built in (as pt has at home))     General ADL Comments: Husband will assist pt prn for LB BADLs once home and pt has all AE from her other hip surgery back 13 years ago.                Cognition   Behavior During Therapy: WFL for tasks assessed/performed Overall Cognitive Status: Within Functional Limits for tasks assessed                         Exercises Total Joint Exercises Quad Sets: AROM;Both;10 reps Heel Slides: AAROM;Left;10  reps Hip ABduction/ADduction: AAROM;Left;10 reps Long Arc Quad: AROM;Left;10 reps           Pertinent Vitals/ Pain       Pain Assessment: No/denies pain Pain Score: 4  Pain Location: left hip Pain Descriptors / Indicators: Sore Pain Intervention(s): Monitored during session            Progress Toward Goals  OT Goals(current goals can now be found in the care plan section)  Progress towards OT goals:  (All education completed)     Plan Discharge plan needs to be updated       End of Session Equipment Utilized During Treatment: Rolling walker   Activity Tolerance Patient tolerated treatment well   Patient Left  (with PT getting ready to ambulate)           Time: 8366-2947 OT Time Calculation (min): 36 min  Charges: OT General Charges $OT Visit: 1 Procedure OT Treatments $Self Care/Home Management : 23-37 mins  Almon Register [phone] 07/19/2014, 1:26 PM

## 2014-07-19 NOTE — Progress Notes (Signed)
Subjective: 2 Days Post-Op Procedure(s) (LRB): LEFT TOTAL HIP ARTHROPLASTY ANTERIOR APPROACH (Left) Patient reports pain as moderate.  Working with therapy.  No acute changes.  Objective: Vital signs in last 24 hours: Temp:  [98.7 F (37.1 C)-99.4 F (37.4 C)] 99.4 F (37.4 C) (08/27 0512) Pulse Rate:  [88-100] 98 (08/27 0512) Resp:  [14-18] 14 (08/27 0512) BP: (125-145)/(59-70) 142/66 mmHg (08/27 0512) SpO2:  [93 %-98 %] 93 % (08/27 0512)  Intake/Output from previous day: 08/26 0701 - 08/27 0700 In: 1560 [P.O.:960; I.V.:600] Out: -  Intake/Output this shift:     Recent Labs  07/18/14 0457  HGB 12.9    Recent Labs  07/18/14 0457  WBC 15.4*  RBC 4.17  HCT 38.2  PLT 222    Recent Labs  07/18/14 0457  NA 139  K 5.1  CL 101  CO2 27  BUN 15  CREATININE 0.53  GLUCOSE 130*  CALCIUM 8.8    Recent Labs  07/17/14 1114  INR 1.07    Sensation intact distally Intact pulses distally Dorsiflexion/Plantar flexion intact Incision: dressing C/D/I  Assessment/Plan: 2 Days Post-Op Procedure(s) (LRB): LEFT TOTAL HIP ARTHROPLASTY ANTERIOR APPROACH (Left) Up with therapy Plan for discharge tomorrow  Mcarthur Rossetti 07/19/2014, 7:09 AM

## 2014-07-20 LAB — CBC
HEMATOCRIT: 36.4 % (ref 36.0–46.0)
Hemoglobin: 11.9 g/dL — ABNORMAL LOW (ref 12.0–15.0)
MCH: 30.7 pg (ref 26.0–34.0)
MCHC: 32.7 g/dL (ref 30.0–36.0)
MCV: 94.1 fL (ref 78.0–100.0)
Platelets: 189 10*3/uL (ref 150–400)
RBC: 3.87 MIL/uL (ref 3.87–5.11)
RDW: 13.4 % (ref 11.5–15.5)
WBC: 9.6 10*3/uL (ref 4.0–10.5)

## 2014-07-20 NOTE — Progress Notes (Signed)
Subjective: 3 Days Post-Op Procedure(s) (LRB): LEFT TOTAL HIP ARTHROPLASTY ANTERIOR APPROACH (Left) Patient reports pain as mild.  Has progressed very well with therapy.  Objective: Vital signs in last 24 hours: Temp:  [98.2 F (36.8 C)-98.9 F (37.2 C)] 98.9 F (37.2 C) (08/28 0533) Pulse Rate:  [72-95] 93 (08/28 0533) Resp:  [16-18] 16 (08/28 0533) BP: (120-145)/(57-75) 135/61 mmHg (08/28 0533) SpO2:  [95 %-97 %] 95 % (08/28 0533)  Intake/Output from previous day: 08/27 0701 - 08/28 0700 In: 480 [P.O.:480] Out: -  Intake/Output this shift:     Recent Labs  07/18/14 0457 07/19/14 0620 07/20/14 0504  HGB 12.9 11.8* 11.9*    Recent Labs  07/19/14 0620 07/20/14 0504  WBC 11.6* 9.6  RBC 3.76* 3.87  HCT 35.4* 36.4  PLT 192 189    Recent Labs  07/18/14 0457  NA 139  K 5.1  CL 101  CO2 27  BUN 15  CREATININE 0.53  GLUCOSE 130*  CALCIUM 8.8    Recent Labs  07/17/14 1114  INR 1.07    Sensation intact distally Intact pulses distally Dorsiflexion/Plantar flexion intact Incision: dressing C/D/I  Assessment/Plan: 3 Days Post-Op Procedure(s) (LRB): LEFT TOTAL HIP ARTHROPLASTY ANTERIOR APPROACH (Left) Discharge home with home health  Jane [name] 07/20/2014, 6:58 AM

## 2014-07-20 NOTE — Discharge Summary (Signed)
Patient ID: Jane [name] MRN: [id number] DOB/AGE: May 06, 1951 63 y.o.  Admit date: 07/17/2014 Discharge date: 07/20/2014  Admission Diagnoses:  Principal Problem:   Arthritis of left hip Active Problems:   Status post total replacement of left hip   Discharge Diagnoses:  Same  Past Medical History  Diagnosis Date  . GERD (gastroesophageal reflux disease)   . Heart murmur     when pregnant  . Pneumonia     hx  . History of recurrent UTIs     on  prophalatic med  . Arthritis   . Cancer     skin nose  . Hepatitis     75  from food    Surgeries: Procedure(s): LEFT TOTAL HIP ARTHROPLASTY ANTERIOR APPROACH on 07/17/2014   Consultants:    Discharged Condition: Improved  Hospital Course: Jane [name] is an 63 y.o. female who was admitted 07/17/2014 for operative treatment ofArthritis of left hip. Patient has severe unremitting pain that affects sleep, daily activities, and work/hobbies. After pre-op clearance the patient was taken to the operating room on 07/17/2014 and underwent  Procedure(s): LEFT TOTAL HIP ARTHROPLASTY ANTERIOR APPROACH.    Patient was given perioperative antibiotics: Anti-infectives   Start     Dose/Rate Route Frequency Ordered Stop   07/17/14 1830  clindamycin (CLEOCIN) IVPB 600 mg     600 mg 100 mL/hr over 30 Minutes Intravenous Every 6 hours 07/17/14 1825 07/18/14 0629   07/17/14 1345  clindamycin (CLEOCIN) IVPB 900 mg     900 mg 100 mL/hr over 30 Minutes Intravenous To Surgery 07/17/14 1330 07/17/14 1403   07/17/14 0600  ceFAZolin (ANCEF) IVPB 2 g/50 mL premix     2 g 100 mL/hr over 30 Minutes Intravenous On call to O.R. 07/16/14 1326 07/17/14 1320       Patient was given sequential compression devices, early ambulation, and chemoprophylaxis to prevent DVT.  Patient benefited maximally from hospital stay and there were no complications.    Recent vital signs: Patient Vitals for the past 24 hrs:  BP Temp Temp src Pulse Resp SpO2  07/20/14 0533  135/61 mmHg 98.9 F (37.2 C) - 93 16 95 %  07/20/14 0400 - - - - 16 97 %  07/20/14 0000 - - - - 16 97 %  07/19/14 2117 137/58 mmHg 98.8 F (37.1 C) - 90 16 97 %  07/19/14 2000 - - - - 16 97 %  07/19/14 1448 145/75 mmHg 98.2 F (36.8 C) Oral 95 18 95 %  07/19/14 0915 120/57 mmHg 98.2 F (36.8 C) Oral 72 16 95 %     Recent laboratory studies:  Recent Labs  07/17/14 1114  07/18/14 0457 07/19/14 0620 07/20/14 0504  WBC  --   < > 15.4* 11.6* 9.6  HGB  --   < > 12.9 11.8* 11.9*  HCT  --   < > 38.2 35.4* 36.4  PLT  --   < > 222 192 189  NA  --   --  139  --   --   K  --   --  5.1  --   --   CL  --   --  101  --   --   CO2  --   --  27  --   --   BUN  --   --  15  --   --   CREATININE  --   --  0.53  --   --   GLUCOSE  --   --  130*  --   --   INR 1.07  --   --   --   --   CALCIUM  --   --  8.8  --   --   < > = values in this interval not displayed.   Discharge Medications:     Medication List    STOP taking these medications       ibuprofen 200 MG tablet  Commonly known as:  ADVIL,MOTRIN      TAKE these medications       aspirin 325 MG EC tablet  Take 1 tablet (325 mg total) by mouth 2 (two) times daily after a meal.     methocarbamol 500 MG tablet  Commonly known as:  ROBAXIN  Take 1 tablet (500 mg total) by mouth every 6 (six) hours as needed for muscle spasms.     nitrofurantoin 50 MG capsule  Commonly known as:  MACRODANTIN  Take 50 mg by mouth at bedtime.     omeprazole 40 MG capsule  Commonly known as:  PRILOSEC  Take 40 mg by mouth daily.     oxyCODONE-acetaminophen 5-325 MG per tablet  Commonly known as:  ROXICET  Take 1-2 tablets by mouth every 4 (four) hours as needed for severe pain.     phentermine 37.5 MG capsule  Take 37.5 mg by mouth every morning.     polyethylene glycol packet  Commonly known as:  MIRALAX / GLYCOLAX  Take 17 g by mouth daily as needed for mild constipation.     VITAMIN D PO  Take 1 tablet by mouth daily.         Diagnostic Studies: Dg Hip Operative Left  07/17/2014   CLINICAL DATA:  Total left hip.  EXAM: OPERATIVE LEFT HIP  COMPARISON:  None.  FINDINGS: Changes of left hip replacement. Normal AP alignment. No hardware or bony complicating feature.  IMPRESSION: Left hip replacement without visible complicating feature.   Electronically Signed   By: Rolm Baptise M.D.   On: 07/17/2014 15:36   Dg Pelvis Portable  07/17/2014   CLINICAL DATA:  Postop left hip arthroplasty  EXAM: PORTABLE PELVIS 1-2 VIEWS  COMPARISON:  None.  FINDINGS: Single-view shows the femoral and acetabular components of the left hip arthroplasty to be well-seated in well aligned. There is no acute fracture or evidence of an operative complication.  IMPRESSION: Well aligned left hip prosthesis on this single AP image.   Electronically Signed   By: Lajean Manes M.D.   On: 07/17/2014 16:23   Dg Hip Portable 1 View Left  07/17/2014   CLINICAL DATA:  Postop LEFT hip  EXAM: PORTABLE LEFT HIP - 1 VIEW  COMPARISON:  Portable exam 1600 hr compared intraoperative images of 07/17/2014  FINDINGS: Osseous demineralization.  Components of a LEFT hip prosthesis identified.  No acute fracture or dislocation.  IMPRESSION: LEFT hip prosthesis without acute complication.   Electronically Signed   By: Lavonia Dana M.D.   On: 07/17/2014 16:23    Disposition: 01-Home or Self Care      Discharge Instructions   Call MD / Call 911    Complete by:  As directed   If you experience chest pain or shortness of breath, CALL 911 and be transported to the hospital emergency room.  If you develope a fever above 101 F, pus (white drainage) or increased drainage or redness at the wound, or calf pain, call your surgeon's office.     Constipation Prevention  Complete by:  As directed   Drink plenty of fluids.  Prune juice may be helpful.  You may use a stool softener, such as Colace (over the counter) 100 mg twice a day.  Use MiraLax (over the counter) for  constipation as needed.     Diet - low sodium heart healthy    Complete by:  As directed      Discharge instructions    Complete by:  As directed   Increase activities as comfort allows. You can get your current dressing wet daily in the shower. You can leave your current dressing in place until your outpatient follow-up. Do take a stool softener daily.     Discharge patient    Complete by:  As directed      Increase activity slowly as tolerated    Complete by:  As directed            Follow-up Information   Follow up with [facility]. (Someone from [facility] will contact you concerning start date and time for Physical therapy.)    Contact information:   [address] [place] [address] [phone]       Follow up with Mcarthur Rossetti, MD In 2 weeks.   Specialty:  Orthopedic Surgery   Contact information:   [place] [address] [phone]        Signed: Mcarthur Rossetti 07/20/2014, 6:59 AM

## 2014-07-20 NOTE — Progress Notes (Signed)
Physical Therapy Treatment Patient Details Name: Shaelynn Dragos MRN: [id number] DOB: 02/26/1951 Today's Date: 07/20/2014    History of Present Illness s/p Lt THA ( direct anterior) 07/17/14    PT Comments    Patient continues to make good progress and recall use of AD and steps. Ok to DC from therapy based on progression of goals.   Follow Up Recommendations  Home health PT;Supervision/Assistance - 24 hour     Equipment Recommendations  Rolling walker with 5" wheels    Recommendations for Other Services       Precautions / Restrictions Precautions Precaution Comments: direct anterior Restrictions Weight Bearing Restrictions: No LLE Weight Bearing: Weight bearing as tolerated    Mobility  Bed Mobility Overal bed mobility: Modified Independent                Transfers Overall transfer level: Modified independent                  Ambulation/Gait Ambulation/Gait assistance: Modified independent (Device/Increase time) Ambulation Distance (Feet): 300 Feet Assistive device: Rolling walker (2 wheeled) Gait Pattern/deviations: Step-through pattern;Decreased stride length     General Gait Details: Patient with good technique and safe use of RW.    Stairs   Stairs assistance: Supervision Stair Management: Step to pattern;Forwards;With walker Number of Stairs: 1 General stair comments: Good recall of technique  Wheelchair Mobility    Modified Rankin (Stroke Patients Only)       Balance                                    Cognition Arousal/Alertness: Awake/alert Behavior During Therapy: WFL for tasks assessed/performed Overall Cognitive Status: Within Functional Limits for tasks assessed                      Exercises Total Joint Exercises Quad Sets: AROM;Both;10 reps Heel Slides: Left;10 reps;AROM Hip ABduction/ADduction: Left;10 reps;AROM Straight Leg Raises: AAROM;Left;10 reps Long Arc Quad: AROM;Left;10 reps     General Comments        Pertinent Vitals/Pain Pain Assessment: No/denies pain    Home Living                      Prior Function            PT Goals (current goals can now be found in the care plan section) Progress towards PT goals: Progressing toward goals    Frequency  7X/week    PT Plan Current plan remains appropriate    Co-evaluation             End of Session           Time: 0354-6568 PT Time Calculation (min): 26 min  Charges:  $Gait Training: 8-22 mins $Therapeutic Exercise: 8-22 mins                    G Codes:      Jacqualyn Posey 07/20/2014, 10:28 AM 07/20/2014 Jacqualyn Posey PTA [phone] pager [phone] office

## 2014-07-20 NOTE — Progress Notes (Signed)
Pt verbalized understanding of d/c teachings; scripts given; pt left via wheelchair with spouse

## 2014-07-21 NOTE — Anesthesia Postprocedure Evaluation (Signed)
  Anesthesia Post-op Note  Patient: Jane [name]  Procedure(s) Performed: Procedure(s): LEFT TOTAL HIP ARTHROPLASTY ANTERIOR APPROACH (Left)  Patient Location: PACU  Anesthesia Type:General  Level of Consciousness: awake  Airway and Oxygen Therapy: Patient Spontanous Breathing  Post-op Pain: moderate  Post-op Assessment: Post-op Vital signs reviewed, Patient's Cardiovascular Status Stable, Respiratory Function Stable and Patent Airway  Post-op Vital Signs: Reviewed  Last Vitals:  Filed Vitals:   07/20/14 0533  BP: 135/61  Pulse: 93  Temp: 37.2 C  Resp: 16    Complications: No apparent anesthesia complications

## 2015-04-12 ENCOUNTER — Encounter: Payer: Self-pay | Admitting: Physician Assistant

## 2015-04-29 ENCOUNTER — Encounter: Payer: Self-pay | Admitting: Physician Assistant

## 2015-04-29 ENCOUNTER — Ambulatory Visit (INDEPENDENT_AMBULATORY_CARE_PROVIDER_SITE_OTHER): Payer: BLUE CROSS/BLUE SHIELD | Admitting: Physician Assistant

## 2015-04-29 VITALS — BP 142/84 | Ht 63.0 in | Wt 175.0 lb

## 2015-04-29 DIAGNOSIS — R1314 Dysphagia, pharyngoesophageal phase: Secondary | ICD-10-CM

## 2015-04-29 DIAGNOSIS — K219 Gastro-esophageal reflux disease without esophagitis: Secondary | ICD-10-CM | POA: Diagnosis not present

## 2015-04-29 MED ORDER — OMEPRAZOLE 20 MG PO CPDR: 20.0000 mg | DELAYED_RELEASE_CAPSULE | Freq: Every day | ORAL | Status: DC

## 2015-04-29 NOTE — Patient Instructions (Addendum)
You have been scheduled for an endoscopy. Please follow written instructions given to you at your visit today. If you use inhalers (even only as needed), please bring them with you on the day of your procedure. Your physician has requested that you go to www.startemmi.com and enter the access code given to you at your visit today. This web site gives a general overview about your procedure. However, you should still follow specific instructions given to you by our office regarding your preparation for the procedure.              Normal BMI (Body Mass Index- based on height and weight) is between 19 and 25. Your BMI today is Body mass index is 31.01 kg/(m^2). Marland Kitchen Please consider follow up  regarding your BMI with your Primary Care Provider.

## 2015-04-29 NOTE — Progress Notes (Signed)
Agree with initial assessment and plans 

## 2015-04-29 NOTE — Progress Notes (Signed)
Patient ID: Jane [name], female   DOB: 02-07-1951, 64 y.o.   MRN: [id number]   Subjective:    Patient ID: Jane [name], female    DOB: 1951-08-11, 63 y.o.   MRN: [id number]  HPI Jane [name] is a very nice 64 year old white female known to Dr. Scarlette Shorts. She has had 2 prior esophageal dilations last done in 2008 for a distal esophageal stricture which was every dilated to 18 mm. She last had colonoscopy in February 2015 with finding of moderate diverticulosis no polyps and is slated for 10 year interval follow-up. She comes in today with recurrent complaints of dysphagia. She says she has had symptoms over the past several months. She has a sensation that her food is "sticking" and sometimes has a full feeling in her esophagus. She has not had any episodes of transient food impaction nor any regurgitation. He says she's chewing very carefully and cutting her food up in small pieces. She occasionally has some difficulty with liquids which will cause strangling. She stays on Prilosec 20 mg by mouth daily and has no current complaints of heartburn or indigestion.  Review of Systems Pertinent positive and negative review of systems were noted in the above HPI section.  All other review of systems was otherwise negative.  Outpatient Encounter Prescriptions as of 04/29/2015  Medication Sig  . omeprazole (PRILOSEC) 20 MG capsule Take 1 capsule (20 mg total) by mouth daily.  . polyethylene glycol (MIRALAX / GLYCOLAX) packet Take 17 g by mouth daily as needed for mild constipation.   . [DISCONTINUED] omeprazole (PRILOSEC) 20 MG capsule Take 20 mg by mouth daily.  Marland Kitchen aspirin EC 325 MG EC tablet Take 1 tablet (325 mg total) by mouth 2 (two) times daily after a meal. (Patient not taking: Reported on 04/29/2015)  . Cholecalciferol (VITAMIN D PO) Take 1 tablet by mouth daily.  . methocarbamol (ROBAXIN) 500 MG tablet Take 1 tablet (500 mg total) by mouth every 6 (six) hours as needed for muscle spasms. (Patient not taking:  Reported on 04/29/2015)  . nitrofurantoin (MACRODANTIN) 50 MG capsule Take 50 mg by mouth at bedtime.  Marland Kitchen oxyCODONE-acetaminophen (ROXICET) 5-325 MG per tablet Take 1-2 tablets by mouth every 4 (four) hours as needed for severe pain. (Patient not taking: Reported on 04/29/2015)  . phentermine 37.5 MG capsule Take 37.5 mg by mouth every morning.  . [DISCONTINUED] omeprazole (PRILOSEC) 40 MG capsule Take 40 mg by mouth daily.   No facility-administered encounter medications on file as of 04/29/2015.   Allergies  Allergen Reactions  . Codeine Other (See Comments)    Rapid heartbeat, shortness of breath   Patient Active Problem List   Diagnosis Date Noted  . Arthritis of left hip 07/17/2014  . 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 GERD (gastroesophageal reflux disease)   . HYPERTENSION 01/30/2010  . DIVERTICULOSIS-COLON 01/30/2010  . DIVERTICULITIS OF COLON 01/27/2010  . CHANGE IN BOWELS 01/21/2010  . ABDOMINAL PAIN-LLQ 01/21/2010  . ABDOMINAL PAIN, LOWER 01/21/2010  . UTI'S, HX OF 01/21/2010  . HYPERLIPIDEMIA 10/28/2007  . GERD 10/28/2007  . HIATAL HERNIA 10/28/2007  . IRRITABLE BOWEL SYNDROME 10/28/2007  . OSTEOARTHRITIS 10/28/2007  . OSTEOARTHRITIS 10/28/2007  . ARTHRITIS 10/28/2007  . COLONIC POLYPS, HX OF 10/28/2007   History   Social History  . Marital Status: Married    Spouse Name: N/A  . Number of Children: N/A  . Years of Education: N/A   Occupational History  . Not on file.  Social History Main Topics  . Smoking status: Former Smoker -- 0.25 packs/day for 15 years    Types: Cigarettes    Quit date: 11/05/2013  . Smokeless tobacco: Never Used  . Alcohol Use: Yes     Comment: 2 times monthly liquor  . Drug Use: No  . Sexual Activity: Not on file   Other Topics Concern  . Not on file   Social History Narrative    Ms. Matthews's family history is negative for Colon cancer, Pancreatic cancer, and Stomach cancer.      Objective:    Filed  Vitals:   04/29/15 0844  BP: 142/84    Physical Exam  well-developed older white female in no acute distress, very pleasant blood pressure 142/84 height 5 foot 3 weight 175. HEENT; nontraumatic normocephalic EOMI PERRLA sclera anicteric, Supple; no JVD, Cardiovascular; regular rate and rhythm with S1-S2 no murmur or gallop, Pulmonary ;clear bilaterally abdomen soft, bowel sounds are present, no palpable mass or hepatosplenomegaly she is basically nontender, Rectal; exam not done, Ext; no clubbing cyanosis or edema skin warm and dry, Psych; mood and affect appropriate       Assessment & Plan:   #1  64 yo female with hx of  distal esophageal  Stricture and 2 previous dilations who has recurrent dysphagia  Consistent with recurrent stricture  Also with occasional  Symptoms with liquids likely mild dysmotility.  #2 Diverticulosis #3 Colon screening- up  to date- due for follow up 2025 #4 HTN  #5 - pt is using phentermine for weight loss  Plan; Schedule for EGD with dilation with Dr. Henrene Pastor . Procedure discussed in detail with pt and she is agreeable to  Proceed. Continue Prilosec 20 mg po qam  Pt instructed to hold Phentermine for 2 weeks prior to her procedure.    Amy S Esterwood PA-C 04/29/2015   Cc: Hulan Fess, MD

## 2015-05-23 ENCOUNTER — Encounter: Payer: Self-pay | Admitting: Internal Medicine

## 2015-05-23 ENCOUNTER — Ambulatory Visit (AMBULATORY_SURGERY_CENTER): Payer: BLUE CROSS/BLUE SHIELD | Admitting: Internal Medicine

## 2015-05-23 VITALS — BP 178/91 | HR 71 | Temp 98.9°F | Resp 28 | Ht 63.0 in | Wt 175.0 lb

## 2015-05-23 DIAGNOSIS — R1314 Dysphagia, pharyngoesophageal phase: Secondary | ICD-10-CM | POA: Diagnosis not present

## 2015-05-23 DIAGNOSIS — K222 Esophageal obstruction: Secondary | ICD-10-CM

## 2015-05-23 DIAGNOSIS — K219 Gastro-esophageal reflux disease without esophagitis: Secondary | ICD-10-CM

## 2015-05-23 MED ORDER — SODIUM CHLORIDE 0.9 % IV SOLN: 500.0000 mL | INTRAVENOUS | Status: DC

## 2015-05-23 NOTE — Patient Instructions (Signed)
YOU HAD AN ENDOSCOPIC PROCEDURE TODAY AT [facility]:   Refer to the procedure report that was given to you for any specific questions about what was found during the examination.  If the procedure report does not answer your questions, please call your gastroenterologist to clarify.  If you requested that your care partner not be given the details of your procedure findings, then the procedure report has been included in a sealed envelope for you to review at your convenience later.  YOU SHOULD EXPECT: Some feelings of bloating in the abdomen. Passage of more gas than usual.  Walking can help get rid of the air that was put into your GI tract during the procedure and reduce the bloating. If you had a lower endoscopy (such as a colonoscopy or flexible sigmoidoscopy) you may notice spotting of blood in your stool or on the toilet paper. If you underwent a bowel prep for your procedure, you may not have a normal bowel movement for a few days.  Please Note:  You might notice some irritation and congestion in your nose or some drainage.  This is from the oxygen used during your procedure.  There is no need for concern and it should clear up in a day or so.  SYMPTOMS TO REPORT IMMEDIATELY:   Following upper endoscopy (EGD)  Vomiting of blood or coffee ground material  New chest pain or pain under the shoulder blades  Painful or persistently difficult swallowing  New shortness of breath  Fever of 100F or higher  Black, tarry-looking stools  For urgent or emergent issues, a gastroenterologist can be reached at any hour by calling [phone].   DIET: Your first meal following the procedure should be a small meal and then it is ok to progress to your normal diet. Heavy or fried foods are harder to digest and may make you feel nauseous or bloated.  Likewise, meals heavy in dairy and vegetables can increase bloating.  Drink plenty of fluids but you should avoid alcoholic beverages for  24 hours.  ACTIVITY:  You should plan to take it easy for the rest of today and you should NOT DRIVE or use heavy machinery until tomorrow (because of the sedation medicines used during the test).    FOLLOW UP: Our staff will call the number listed on your records the next business day following your procedure to check on you and address any questions or concerns that you may have regarding the information given to you following your procedure. If we do not reach you, we will leave a message.  However, if you are feeling well and you are not experiencing any problems, there is no need to return our call.  We will assume that you have returned to your regular daily activities without incident.  If any biopsies were taken you will be contacted by phone or by letter within the next 1-3 weeks.  Please call us at [phone] if you have not heard about the biopsies in 3 weeks.    SIGNATURES/CONFIDENTIALITY: You and/or your care partner have signed paperwork which will be entered into your electronic medical record.  These signatures attest to the fact that that the information above on your After Visit Summary has been reviewed and is understood.  Full responsibility of the confidentiality of this discharge information lies with you and/or your care-partner.  Clear liquids until 5pm, then soft foods the rest of the day Resume normal diet tomorrow  Follow- up as  needed

## 2015-05-23 NOTE — Progress Notes (Signed)
Called to room to assist during endoscopic procedure.  Patient ID and intended procedure confirmed with present staff. Received instructions for my participation in the procedure from the performing physician.  

## 2015-05-23 NOTE — Progress Notes (Signed)
Report to PACU, RN, vss, BBS= Clear.  

## 2015-05-23 NOTE — Op Note (Signed)
[facility]  [address] [place], [address]   ENDOSCOPY PROCEDURE REPORT  PATIENT: Jane, [name]  MR#: [id number] BIRTHDATE: 1951/06/02 , 63  yrs. old GENDER: female ENDOSCOPIST: Eustace Quail, MD REFERRED BY:  .  Self / Office PROCEDURE DATE:  05/23/2015 PROCEDURE:  EGD, diagnostic and Maloney dilation of esophagus   -27 French ASA CLASS:     Class II INDICATIONS:  dysphagia. MEDICATIONS: Monitored anesthesia care and Propofol 140 mg IV TOPICAL ANESTHETIC: none  DESCRIPTION OF PROCEDURE: After the risks benefits and alternatives of the procedure were thoroughly explained, informed consent was obtained.  The LB [id number] endoscope was introduced through the mouth and advanced to the second portion of the duodenum , Without limitations.  The instrument was slowly withdrawn as the mucosa was fully examined.  EXAM:The esophagus revealed a ringlike large caliber stricture at gastroesophageal junction (36 cm from the teeth).  Stomach was normal.  The duodenum was normal.  Retroflexed views revealed a hiatal hernia.     The scope was then withdrawn from the patient and the procedure completed. THERAPY: 54 French Maloney dilator was passed into the esophagus without resistance or heme. Tolerated well  COMPLICATIONS: There were no immediate complications.  ENDOSCOPIC IMPRESSION: 1. Benign distal esophageal stricture status post dilation-54 [place] 2. GERD  RECOMMENDATIONS: 1.  Clear liquids until 5 PM, then soft foods rest of day.  Resume prior diet tomorrow. 2.  Continue Prilosec daily 3. Follow-up as needed.  REPEAT EXAM:  eSigned:  Eustace Quail, MD 05/23/2015 3:21 PM    CC:The Patient and Hulan Fess, MD

## 2015-05-24 ENCOUNTER — Telehealth: Payer: Self-pay | Admitting: *Deleted

## 2015-05-24 NOTE — Telephone Encounter (Signed)
She had an atraumatic dilation and looked great after the procedure being fully awake. Agree with supportive care and time. Certainly, she should seek medical attention should symptoms worsen. Thank you

## 2015-05-24 NOTE — Telephone Encounter (Signed)
Pt. Advised that Dr. Henrene Pastor made aware of her concerns.  She was advised to call if she persists having problems.

## 2015-05-24 NOTE — Telephone Encounter (Signed)
  Follow up Call-  Call back number 05/23/2015 01/08/2014  Post procedure Call Back phone  # [phone]  Permission to leave phone message Yes Yes     Patient questions:  Do you have a fever, pain , or abdominal swelling? Yes.   Pain Score  5 *  Have you tolerated food without any problems? No.  Have you been able to return to your normal activities? Yes.    Do you have any questions about your discharge instructions: Diet   No. Medications  No. Follow up visit  No.  Do you have questions or concerns about your Care? No.  Actions: * If pain score is 4 or above: Physician/ provider Notified : Scarlette Shorts, MD.  Complains of pain up to "5" when swallowing.  Feels discomfort "between my breasts" when swallowing.  Believes she had fever During the night because she became very hot, then noticed that she began to sweat.  States her temp is 99.5 this AM.   I advised her to stay with the soft diet until the discomfort passed.  I also advised that she may have discomfort for a couple of days post procedure.  Lastly, I advised  her to call back if her tempertature goes up and the swallowing becomes worse.

## 2015-12-30 ENCOUNTER — Encounter: Payer: Self-pay | Admitting: Emergency Medicine

## 2015-12-30 ENCOUNTER — Emergency Department (INDEPENDENT_AMBULATORY_CARE_PROVIDER_SITE_OTHER)
Admission: EM | Admit: 2015-12-30 | Discharge: 2015-12-30 | Disposition: A | Discharge status: Home / Self Care | Payer: BLUE CROSS/BLUE SHIELD | Source: Home / Self Care | Attending: Emergency Medicine | Admitting: Emergency Medicine

## 2015-12-30 DIAGNOSIS — J209 Acute bronchitis, unspecified: Secondary | ICD-10-CM

## 2015-12-30 MED ORDER — BENZONATATE 200 MG PO CAPS: ORAL_CAPSULE | ORAL | Status: DC

## 2015-12-30 MED ORDER — AZITHROMYCIN 250 MG PO TABS: ORAL_TABLET | ORAL | Status: DC

## 2015-12-30 MED ORDER — PREDNISONE 20 MG PO TABS: 20.0000 mg | ORAL_TABLET | Freq: Two times a day (BID) | ORAL | Status: DC

## 2015-12-30 NOTE — ED Provider Notes (Addendum)
CSN: [id number]     Arrival date & time 12/30/15  0818 History   None    Chief Complaint  Patient presents with  . Cough   (Consider location/radiation/quality/duration/timing/severity/associated sxs/prior Treatment) The history is provided by the patient.  URI HISTORY  Jane [name] is a 65 y.o. female who complains of onset of cold symptoms for 3 weeks.  Have been using over-the-counter treatment which is not helping. Started 3 weeks ago with cough and low-grade fever which improved somewhat, then for the past 4-5 days, worsening hacking nonproductive cough.  No chills/sweats  +  Nasal congestion +  Mild Discolored Post-nasal drainage No sinus pain/pressure No sore throat  +  cough No wheezing Positive chest congestion No hemoptysis No shortness of breath No pleuritic pain  No itchy/red eyes No earache  No nausea No vomiting No abdominal pain No diarrhea  No skin rashes +  Fatigue No myalgias Mild nonfocal headache. No focal neurologic symptoms    Past Medical History  Diagnosis Date  . GERD (gastroesophageal reflux disease)   . Heart murmur     when pregnant  . Pneumonia     hx  . History of recurrent UTIs     on  prophalatic med  . Arthritis   . Cancer ([place])     skin nose  . Hepatitis     75  from food   Past Surgical History  Procedure Laterality Date  . Cesarean section    . Total hip arthroplasty    . Total hip arthroplasty Left 07/17/2014    Procedure: LEFT TOTAL HIP ARTHROPLASTY ANTERIOR APPROACH;  Surgeon: Mcarthur Rossetti, MD;  Location: [place];  Service: Orthopedics;  Laterality: Left;   Family History  Problem Relation Age of Onset  . Colon cancer Neg Hx   . Pancreatic cancer Neg Hx   . Stomach cancer Neg Hx   . Breast cancer Paternal Grandmother   . Brain cancer Paternal Uncle   . Diabetes Sister   . Anuerysm Paternal Aunt   . COPD Brother   . COPD Father   . Cerebral aneurysm Mother   . Heart disease Mother    Social History   Substance Use Topics  . Smoking status: Current Every Day Smoker -- 0.25 packs/day for 45 years    Types: Cigarettes    Last Attempt to Quit: 11/05/2013  . Smokeless tobacco: Never Used  . Alcohol Use: 6.0 oz/week    10 Shots of liquor per week     Comment: 2 times monthly liquor   OB History    No data available     Review of Systems  Constitutional: Negative for unexpected weight change.  Respiratory: Negative for shortness of breath.   Cardiovascular: Negative for chest pain.  Gastrointestinal: Negative for nausea, vomiting and abdominal pain.  All other systems reviewed and are negative.   Allergies  Codeine  Home Medications   Prior to Admission medications   Medication Sig Start Date End Date Taking? Authorizing Provider  aspirin EC 325 MG EC tablet Take 1 tablet (325 mg total) by mouth 2 (two) times daily after a meal. Patient not taking: Reported on 04/29/2015 07/19/14   Mcarthur Rossetti, MD  azithromycin (ZITHROMAX Z-PAK) 250 MG tablet Take 2 tablets on day one, then 1 tablet daily on days 2 through 5 12/30/15   Jacqulyn Cane, MD  benzonatate (TESSALON) 200 MG capsule Take 1 every 8 hours as needed for cough. 12/30/15   Jacqulyn Cane, MD  Cholecalciferol (VITAMIN D PO) Take 1 tablet by mouth daily.    Historical Provider, MD  nitrofurantoin (MACRODANTIN) 50 MG capsule Take 50 mg by mouth at bedtime.    Historical Provider, MD  omeprazole (PRILOSEC) 20 MG capsule Take 1 capsule (20 mg total) by mouth daily. 04/29/15   Amy S Esterwood, PA-C  phentermine 37.5 MG capsule Take 37.5 mg by mouth every morning.    Historical Provider, MD  predniSONE (DELTASONE) 20 MG tablet Take 1 tablet (20 mg total) by mouth 2 (two) times daily with a meal. X 5 days 12/30/15   Jacqulyn Cane, MD   Meds Ordered and Administered this Visit  Medications - No data to display  BP 171/95 mmHg  Pulse 95  Temp(Src) 98.4 F (36.9 C) (Oral)  Ht 5\' 1"  (1.549 m)  Wt 160 lb (72.576 kg)  BMI 30.25 kg/m2   SpO2 96% No data found.   Physical Exam  Constitutional: She is oriented to person, place, and time. She appears well-developed and well-nourished. No distress.  HENT:  Head: Normocephalic and atraumatic.  Right Ear: Tympanic membrane, external ear and ear canal normal.  Left Ear: Tympanic membrane, external ear and ear canal normal.  Nose: Mucosal edema and rhinorrhea present. Right sinus exhibits maxillary sinus tenderness. Left sinus exhibits maxillary sinus tenderness.  Mouth/Throat: Oropharynx is clear and moist. No oral lesions. No oropharyngeal exudate.  Eyes: Right eye exhibits no discharge. Left eye exhibits no discharge. No scleral icterus.  Neck: Neck supple.  Cardiovascular: Normal rate, regular rhythm and normal heart sounds.   Pulmonary/Chest: Effort normal. No respiratory distress. She has no decreased breath sounds. She has no wheezes. She has rhonchi. She has no rales.  Abdominal: She exhibits no distension.  Musculoskeletal: She exhibits no edema.  Lymphadenopathy:    She has no cervical adenopathy.  Neurological: She is alert and oriented to person, place, and time.  Skin: Skin is warm and dry. No rash noted.  Psychiatric: She has a normal mood and affect.  Nursing note and vitals reviewed.   ED Course  Procedures (including critical care time)  Labs Review Labs Reviewed - No data to display  Imaging Review No results found.    MDM   1. Acute bronchitis, unspecified organism    and acute sinusitis  Treatment options discussed, as well as risks, benefits, alternatives. She declined option of chest x-ray.--- I did explain to her that she should have chest x-ray if cough does not resolve in 7-10 days. Reviewed history of side effects codeine, so we are avoiding codeine type cough med. Patient voiced understanding and agreement with the following plans: New Prescriptions   AZITHROMYCIN (ZITHROMAX Z-PAK) 250 MG TABLET    Take 2 tablets on day one, then 1  tablet daily on days 2 through 5   BENZONATATE (TESSALON) 200 MG CAPSULE    Take 1 every 8 hours as needed for cough.   PREDNISONE (DELTASONE) 20 MG TABLET    Take 1 tablet (20 mg total) by mouth 2 (two) times daily with a meal. X 5 days   plain Mucinex for expectorant. Advised to quit smoking. Advised to avoid decongestants which might raise BP. Explained BP somewhat elevated today, and advised BP recheck with PCP within one to 2 weeks. Follow-up with your primary care doctor in 5-7 days if not improving, or sooner if symptoms become worse. Precautions discussed. Red flags discussed. Questions invited and answered. Patient voiced understanding and agreement.  Jacqulyn Cane, MD 12/30/15 [id number]  Jacqulyn Cane, MD 12/30/15 [phone]

## 2015-12-30 NOTE — ED Notes (Signed)
Dry cough, runny nose, headache x 3 weeks

## 2016-03-10 ENCOUNTER — Encounter: Payer: Self-pay | Admitting: *Deleted

## 2016-03-10 ENCOUNTER — Emergency Department (INDEPENDENT_AMBULATORY_CARE_PROVIDER_SITE_OTHER)
Admission: EM | Admit: 2016-03-10 | Discharge: 2016-03-10 | Disposition: A | Discharge status: Home / Self Care | Payer: BLUE CROSS/BLUE SHIELD | Source: Home / Self Care | Attending: Family Medicine | Admitting: Family Medicine

## 2016-03-10 DIAGNOSIS — R300 Dysuria: Secondary | ICD-10-CM | POA: Diagnosis not present

## 2016-03-10 DIAGNOSIS — N390 Urinary tract infection, site not specified: Secondary | ICD-10-CM

## 2016-03-10 HISTORY — DX: Type 2 diabetes mellitus without complications: E11.9

## 2016-03-10 LAB — POCT URINALYSIS DIP (MANUAL ENTRY)
Glucose, UA: 100 — AB
Ketones, POC UA: NEGATIVE
Nitrite, UA: POSITIVE — AB
Protein Ur, POC: 100 — AB
Spec Grav, UA: 1.025 (ref 1.005–1.03)
Urobilinogen, UA: 1 (ref 0–1)
pH, UA: 5.5 (ref 5–8)

## 2016-03-10 MED ORDER — FLUCONAZOLE 150 MG PO TABS: 150.0000 mg | ORAL_TABLET | Freq: Once | ORAL | Status: DC

## 2016-03-10 MED ORDER — CEFTRIAXONE SODIUM 1 G IJ SOLR
1.0000 g | Freq: Once | INTRAMUSCULAR | Status: AC
  Administered 2016-03-10: 1 g via INTRAMUSCULAR

## 2016-03-10 MED ORDER — PHENAZOPYRIDINE HCL 200 MG PO TABS: 200.0000 mg | ORAL_TABLET | Freq: Three times a day (TID) | ORAL | Status: DC

## 2016-03-10 MED ORDER — CEPHALEXIN 500 MG PO CAPS: 500.0000 mg | ORAL_CAPSULE | Freq: Three times a day (TID) | ORAL | Status: DC

## 2016-03-10 NOTE — Discharge Instructions (Signed)
Please take antibiotics as prescribed and be sure to complete entire course even if you start to feel better to ensure infection does not come back, unless advised by another medical provider as your antibiotic may need to be changed depending on the urine culture results.   Be sure to drink plenty of water, at least 6-8 glasses of water per day.

## 2016-03-10 NOTE — ED Notes (Signed)
Pt c/o dysuria, LBP and bladder spasms x 1 day. She has took AZO yesterday. She took Cefuroxine BID for UTI started on 02/13/16. She stopped it early since she was feeling better.

## 2016-03-10 NOTE — ED Provider Notes (Signed)
CSN: [id number]     Arrival date & time 03/10/16  0932 History   First MD Initiated Contact with Patient 03/10/16 1006     Chief Complaint  Patient presents with  . Dysuria   (Consider location/radiation/quality/duration/timing/severity/associated sxs/prior Treatment) HPI The pt is a 65yo female presenting to [facility] with c/o dysuria for 1-2 days with associated bladder spasms and lower back pain.  She reports being prescribed Cefuroxine BID for 7 days but only wound up taking 4 days as pt initially felt better and forgot the rest of the medication when going out of town.  She now has severe cramping and burning with urination and has mild vaginal irritation she believes is a yeast infection starting.  Denies fever, chills, n/v/d.  She does take daily Macrodantin 50mg  at night for preventative care.    Past Medical History  Diagnosis Date  . GERD (gastroesophageal reflux disease)   . Heart murmur     when pregnant  . Pneumonia     hx  . History of recurrent UTIs     on  prophalatic med  . Arthritis   . Cancer ([place])     skin nose  . Hepatitis     75  from food  . Diabetes mellitus without complication [facility])    Past Surgical History  Procedure Laterality Date  . Cesarean section    . Total hip arthroplasty    . Total hip arthroplasty Left 07/17/2014    Procedure: LEFT TOTAL HIP ARTHROPLASTY ANTERIOR APPROACH;  Surgeon: Mcarthur Rossetti, MD;  Location: [place];  Service: Orthopedics;  Laterality: Left;   Family History  Problem Relation Age of Onset  . Colon cancer Neg Hx   . Pancreatic cancer Neg Hx   . Stomach cancer Neg Hx   . Breast cancer Paternal Grandmother   . Brain cancer Paternal Uncle   . Diabetes Sister   . Anuerysm Paternal Aunt   . COPD Brother   . COPD Father   . Cerebral aneurysm Mother   . Heart disease Mother    Social History  Substance Use Topics  . Smoking status: Current Every Day Smoker -- 0.25 packs/day for 45 years    Types: Cigarettes    Last  Attempt to Quit: 11/05/2013  . Smokeless tobacco: Never Used  . Alcohol Use: 6.0 oz/week    10 Shots of liquor per week     Comment: 5 drinks per week   OB History    No data available     Review of Systems  Constitutional: Negative for fever and chills.  HENT: Negative for congestion, ear pain, sore throat, trouble swallowing and voice change.   Respiratory: Negative for cough and shortness of breath.   Cardiovascular: Negative for chest pain and palpitations.  Gastrointestinal: Positive for abdominal pain ( lower). Negative for nausea, vomiting and diarrhea.  Genitourinary: Positive for dysuria, urgency, frequency, vaginal pain ( irritation) and pelvic pain. Negative for hematuria, flank pain, decreased urine volume, vaginal bleeding and vaginal discharge.  Musculoskeletal: Positive for back pain (bilateral, lower). Negative for myalgias and arthralgias.  Skin: Negative for rash.    Allergies  Codeine  Home Medications   Prior to Admission medications   Medication Sig Start Date End Date Taking? Authorizing Provider  cefUROXime (CEFTIN) 250 MG tablet Take 250 mg by mouth 2 (two) times daily with a meal.   Yes Historical Provider, MD  nitrofurantoin (MACRODANTIN) 50 MG capsule Take 50 mg by mouth at bedtime.  Yes Historical Provider, MD  omeprazole (PRILOSEC) 20 MG capsule Take 1 capsule (20 mg total) by mouth daily. 04/29/15  Yes Amy S Esterwood, PA-C  Phenazopyridine HCl (AZO TABS PO) Take by mouth.   Yes Historical Provider, MD  phentermine 37.5 MG capsule Take 37.5 mg by mouth every morning.   Yes Historical Provider, MD  cephALEXin (KEFLEX) 500 MG capsule Take 1 capsule (500 mg total) by mouth 3 (three) times daily. For 7 days 03/10/16   Noland Fordyce, PA-C  Cholecalciferol (VITAMIN D PO) Take 1 tablet by mouth daily.    Historical Provider, MD  fluconazole (DIFLUCAN) 150 MG tablet Take 1 tablet (150 mg total) by mouth once. May repeat in 3 days if symptoms still present  03/10/16   Noland Fordyce, PA-C  phenazopyridine (PYRIDIUM) 200 MG tablet Take 1 tablet (200 mg total) by mouth 3 (three) times daily. 03/10/16   Noland Fordyce, PA-C   Meds Ordered and Administered this Visit   Medications  cefTRIAXone (ROCEPHIN) injection 1 g (1 g Intramuscular Given 03/10/16 1039)    BP 151/87 mmHg  Pulse 90  Temp(Src) 98.1 F (36.7 C) (Oral)  Resp 16  Ht 5\' 1"  (1.549 m)  Wt 162 lb (73.483 kg)  BMI 30.63 kg/m2  SpO2 99% No data found.   Physical Exam  Constitutional: She is oriented to person, place, and time. She appears well-developed and well-nourished.  HENT:  Head: Normocephalic and atraumatic.  Eyes: EOM are normal.  Neck: Normal range of motion.  Cardiovascular: Normal rate, regular rhythm and normal heart sounds.   Pulmonary/Chest: Effort normal and breath sounds normal. No respiratory distress. She has no wheezes. She has no rales. She exhibits no tenderness.  Abdominal: Soft. She exhibits no distension and no mass. There is tenderness. There is no rebound, no guarding and no CVA tenderness.  Soft, non-distended. Mild tenderness to suprapubic region. No rebound or guarding.  Musculoskeletal: Normal range of motion.  Neurological: She is alert and oriented to person, place, and time.  Skin: Skin is warm and dry.  Psychiatric: She has a normal mood and affect. Her behavior is normal.  Nursing note and vitals reviewed.   ED Course  Procedures (including critical care time)  Labs Review Labs Reviewed  POCT URINALYSIS DIP (MANUAL ENTRY) - Abnormal; Notable for the following:    Color, UA orange (*)    Clarity, UA cloudy (*)    Glucose, UA =100 (*)    Bilirubin, UA small (*)    Blood, UA moderate (*)    Protein Ur, POC =100 (*)    Nitrite, UA Positive (*)    Leukocytes, UA small (1+) (*)    All other components within normal limits  URINE CULTURE    Imaging Review No results found.     MDM   1. Dysuria   2. UTI (lower urinary tract  infection)    Pt c/o dysuria and bladder spasms with lower back pain for 1-2 days after completing half a course of cefuroxine prescribed on 02/13/16.  UA c/w UTI. Will send culture.  Tx in UC: Rocephin 1g IM Rx: pyridium and keflex   F/u with PCP in 1 week if not improving, sooner if worsening. Patient verbalized understanding and agreement with treatment plan.    Noland Fordyce, PA-C 03/10/16 [phone]

## 2016-03-11 LAB — URINE CULTURE
Colony Count: NO GROWTH
Organism ID, Bacteria: NO GROWTH

## 2016-03-12 ENCOUNTER — Telehealth: Payer: Self-pay | Admitting: *Deleted

## 2016-03-13 DIAGNOSIS — R300 Dysuria: Secondary | ICD-10-CM | POA: Diagnosis not present

## 2016-03-13 DIAGNOSIS — Z0000 Encounter for general adult medical examination without abnormal findings: Secondary | ICD-10-CM | POA: Diagnosis not present

## 2016-03-26 DIAGNOSIS — Z0000 Encounter for general adult medical examination without abnormal findings: Secondary | ICD-10-CM | POA: Diagnosis not present

## 2016-03-26 DIAGNOSIS — R300 Dysuria: Secondary | ICD-10-CM | POA: Diagnosis not present

## 2016-05-14 ENCOUNTER — Emergency Department (INDEPENDENT_AMBULATORY_CARE_PROVIDER_SITE_OTHER)
Admission: EM | Admit: 2016-05-14 | Discharge: 2016-05-14 | Disposition: A | Discharge status: Home / Self Care | Payer: BLUE CROSS/BLUE SHIELD | Source: Home / Self Care | Attending: Family Medicine | Admitting: Family Medicine

## 2016-05-14 ENCOUNTER — Encounter: Payer: Self-pay | Admitting: Emergency Medicine

## 2016-05-14 DIAGNOSIS — R531 Weakness: Secondary | ICD-10-CM

## 2016-05-14 DIAGNOSIS — N390 Urinary tract infection, site not specified: Secondary | ICD-10-CM | POA: Diagnosis not present

## 2016-05-14 DIAGNOSIS — R5383 Other fatigue: Secondary | ICD-10-CM | POA: Diagnosis not present

## 2016-05-14 LAB — POCT URINALYSIS DIP (MANUAL ENTRY)
Bilirubin, UA: NEGATIVE
Glucose, UA: NEGATIVE
Ketones, POC UA: NEGATIVE
Nitrite, UA: NEGATIVE
Spec Grav, UA: 1.015 (ref 1.005–1.03)
Urobilinogen, UA: 0.2 (ref 0–1)
pH, UA: 5.5 (ref 5–8)

## 2016-05-14 MED ORDER — CEPHALEXIN 500 MG PO CAPS: 500.0000 mg | ORAL_CAPSULE | Freq: Two times a day (BID) | ORAL | Status: DC

## 2016-05-14 NOTE — ED Provider Notes (Signed)
CSN: [id number]     Arrival date & time 05/14/16  1700 History   First MD Initiated Contact with Patient 05/14/16 1721     Chief Complaint  Patient presents with  . Urinary Urgency  . Weakness  . Back Pain   (Consider location/radiation/quality/duration/timing/severity/associated sxs/prior Treatment) HPI Jane [name] is a 65 y.o. female presenting to UC with c/o generalized weakness and fatigue with associated lower back pain and urinary urgency. Lower back pain is aching and sore, 2/10 at this time. Hx of recurrent UTIs.  Low-grade fever today, took 400mg  ibuprofen at 1600 today.  She has had decreased appetite but trying to stay hydrated. Denies n/v/d.    Past Medical History  Diagnosis Date  . GERD (gastroesophageal reflux disease)   . Heart murmur     when pregnant  . Pneumonia     hx  . History of recurrent UTIs     on  prophalatic med  . Arthritis   . Cancer ([place])     skin nose  . Hepatitis     75  from food  . Diabetes mellitus without complication [facility])    Past Surgical History  Procedure Laterality Date  . Cesarean section    . Total hip arthroplasty    . Total hip arthroplasty Left 07/17/2014    Procedure: LEFT TOTAL HIP ARTHROPLASTY ANTERIOR APPROACH;  Surgeon: Mcarthur Rossetti, MD;  Location: [place];  Service: Orthopedics;  Laterality: Left;   Family History  Problem Relation Age of Onset  . Colon cancer Neg Hx   . Pancreatic cancer Neg Hx   . Stomach cancer Neg Hx   . Breast cancer Paternal Grandmother   . Brain cancer Paternal Uncle   . Diabetes Sister   . Anuerysm Paternal Aunt   . COPD Brother   . COPD Father   . Cerebral aneurysm Mother   . Heart disease Mother    Social History  Substance Use Topics  . Smoking status: Current Every Day Smoker -- 0.25 packs/day for 45 years    Types: Cigarettes    Last Attempt to Quit: 11/05/2013  . Smokeless tobacco: Never Used  . Alcohol Use: 6.0 oz/week    10 Shots of liquor per week     Comment: 5  drinks per week   OB History    No data available     Review of Systems  Constitutional: Positive for fever, appetite change and fatigue. Negative for chills.  HENT: Negative for congestion and sore throat.   Respiratory: Negative for cough and shortness of breath.   Cardiovascular: Negative for chest pain, palpitations and leg swelling.  Gastrointestinal: Negative for nausea, vomiting, abdominal pain and diarrhea.  Genitourinary: Positive for urgency and frequency. Negative for dysuria, hematuria, flank pain and pelvic pain.  Musculoskeletal: Positive for back pain. Negative for myalgias and arthralgias.  Neurological: Positive for weakness (generalized). Negative for dizziness, syncope, light-headedness and headaches.    Allergies  Codeine  Home Medications   Prior to Admission medications   Medication Sig Start Date End Date Taking? Authorizing Provider  cefUROXime (CEFTIN) 250 MG tablet Take 250 mg by mouth 2 (two) times daily with a meal.    Historical Provider, MD  cephALEXin (KEFLEX) 500 MG capsule Take 1 capsule (500 mg total) by mouth 3 (three) times daily. For 7 days 03/10/16   Noland Fordyce, PA-C  cephALEXin (KEFLEX) 500 MG capsule Take 1 capsule (500 mg total) by mouth 2 (two) times daily. For 7 days  05/14/16   Noland Fordyce, PA-C  Cholecalciferol (VITAMIN D PO) Take 1 tablet by mouth daily.    Historical Provider, MD  fluconazole (DIFLUCAN) 150 MG tablet Take 1 tablet (150 mg total) by mouth once. May repeat in 3 days if symptoms still present 03/10/16   Noland Fordyce, PA-C  nitrofurantoin (MACRODANTIN) 50 MG capsule Take 50 mg by mouth at bedtime.    Historical Provider, MD  omeprazole (PRILOSEC) 20 MG capsule Take 1 capsule (20 mg total) by mouth daily. 04/29/15   Amy S Esterwood, PA-C  phenazopyridine (PYRIDIUM) 200 MG tablet Take 1 tablet (200 mg total) by mouth 3 (three) times daily. 03/10/16   Noland Fordyce, PA-C  Phenazopyridine HCl (AZO TABS PO) Take by mouth.     Historical Provider, MD  phentermine 37.5 MG capsule Take 37.5 mg by mouth every morning.    Historical Provider, MD   Meds Ordered and Administered this Visit  Medications - No data to display  BP 134/80 mmHg  Pulse 102  Temp(Src) 99.1 F (37.3 C) (Oral)  Resp 16  Ht 5\' 2"  (1.575 m)  Wt 161 lb (73.029 kg)  BMI 29.44 kg/m2  SpO2 98% No data found.   Physical Exam  Constitutional: She appears well-developed and well-nourished. No distress.  HENT:  Head: Normocephalic and atraumatic.  Mouth/Throat: Oropharynx is clear and moist.  Eyes: Conjunctivae are normal. No scleral icterus.  Neck: Normal range of motion. Neck supple.  Cardiovascular: Normal rate, regular rhythm and normal heart sounds.   Pulmonary/Chest: Effort normal and breath sounds normal. No respiratory distress. She has no wheezes. She has no rales.  Abdominal: Soft. Bowel sounds are normal. She exhibits no distension and no mass. There is no tenderness. There is CVA tenderness ( bilateral). There is no rebound and no guarding.  Musculoskeletal: Normal range of motion.  Neurological: She is alert.  Skin: Skin is warm and dry. She is not diaphoretic.  Nursing note and vitals reviewed.   ED Course  Procedures (including critical care time)  Labs Review Labs Reviewed  POCT URINALYSIS DIP (MANUAL ENTRY) - Abnormal; Notable for the following:    Color, UA light yellow (*)    Clarity, UA cloudy (*)    Blood, UA small (*)    Protein Ur, POC trace (*)    Leukocytes, UA small (1+) (*)    All other components within normal limits  URINE CULTURE    Imaging Review No results found.    MDM   1. UTI (lower urinary tract infection)   2. Generalized weakness   3. Other fatigue    Pt c/o generalized weakness and fatigue with urinary urgency and lower back pain. Hx of recurrent UTIs.    UA- small leukocytes, trace protein, and small blood. Will send culture  Will treat for UTI while culture pending.    Encouraged fluids and rest. F/u with PCP in 3-4 days if not improving, sooner if worsening. Discussed symptoms that warrant emergent care in the ED. Patient verbalized understanding and agreement with treatment plan.     Noland Fordyce, PA-C 05/14/16 [phone]

## 2016-05-14 NOTE — ED Notes (Signed)
States has been in bed all day; feels weak and has headache with some low back pain x 2 days; had low grade fever today; took ibuprofen 400mg  at 1600 today.

## 2016-05-14 NOTE — Discharge Instructions (Signed)
You may try over the counter medication called Azo to help with bladder spasms.  This medication can make your urine orange, which is normal.  ° ° °

## 2016-05-16 LAB — URINE CULTURE: Colony Count: >=100000

## 2016-05-18 ENCOUNTER — Telehealth: Payer: Self-pay | Admitting: *Deleted

## 2016-05-18 NOTE — ED Notes (Unsigned)
LM on home # to call back for Ucx results. Mobile # mailbox is full. Charna Archer, LPN

## 2016-06-17 ENCOUNTER — Emergency Department (INDEPENDENT_AMBULATORY_CARE_PROVIDER_SITE_OTHER)
Admission: EM | Admit: 2016-06-17 | Discharge: 2016-06-17 | Disposition: A | Discharge status: Home / Self Care | Payer: BLUE CROSS/BLUE SHIELD | Source: Home / Self Care | Attending: Family Medicine | Admitting: Family Medicine

## 2016-06-17 ENCOUNTER — Encounter: Payer: Self-pay | Admitting: *Deleted

## 2016-06-17 DIAGNOSIS — B309 Viral conjunctivitis, unspecified: Secondary | ICD-10-CM

## 2016-06-17 MED ORDER — KETOROLAC TROMETHAMINE 0.5 % OP SOLN: 1.0000 [drp] | Freq: Four times a day (QID) | OPHTHALMIC | 0 refills | Status: DC

## 2016-06-17 NOTE — ED Triage Notes (Signed)
Pt c/o eye redness and d/c upon awaking this morning. Denies recent fever, URI, or injury. Charna Archer, LPN

## 2016-06-17 NOTE — ED Provider Notes (Signed)
[name]    CSN: [id number] Arrival date & time: 06/17/16  1447  First Provider Contact:  None       History   Chief Complaint Chief Complaint  Patient presents with  . Eye Problem    HPI Markida Varnell is a 65 y.o. female.   Patient awoke today with her left eye red and "matted."   The history is provided by the patient.    Past Medical History:  Diagnosis Date  . Arthritis   . Cancer ([place])    skin nose  . Diabetes mellitus without complication ([place])   . GERD (gastroesophageal reflux disease)   . Heart murmur    when pregnant  . Hepatitis    75  from food  . History of recurrent UTIs    on  prophalatic med  . Pneumonia    hx    Patient Active Problem List   Diagnosis Date Noted  . Arthritis of left hip 07/17/2014  . 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 GERD (gastroesophageal reflux disease)   . HYPERTENSION 01/30/2010  . DIVERTICULOSIS-COLON 01/30/2010  . DIVERTICULITIS OF COLON 01/27/2010  . CHANGE IN BOWELS 01/21/2010  . ABDOMINAL PAIN-LLQ 01/21/2010  . ABDOMINAL PAIN, LOWER 01/21/2010  . UTI'S, HX OF 01/21/2010  . HYPERLIPIDEMIA 10/28/2007  . GERD 10/28/2007  . HIATAL HERNIA 10/28/2007  . IRRITABLE BOWEL SYNDROME 10/28/2007  . OSTEOARTHRITIS 10/28/2007  . OSTEOARTHRITIS 10/28/2007  . ARTHRITIS 10/28/2007  . COLONIC POLYPS, HX OF 10/28/2007    Past Surgical History:  Procedure Laterality Date  . CESAREAN SECTION    . TOTAL HIP ARTHROPLASTY    . TOTAL HIP ARTHROPLASTY Left 07/17/2014   Procedure: LEFT TOTAL HIP ARTHROPLASTY ANTERIOR APPROACH;  Surgeon: Mcarthur Rossetti, MD;  Location: [facility];  Service: Orthopedics;  Laterality: Left;    OB History    No data available       Home Medications    Prior to Admission medications   Medication Sig Start Date End Date Taking? Authorizing Provider  Cholecalciferol (VITAMIN D PO) Take 1 tablet by mouth daily.    Historical Provider, MD  ketorolac (ACULAR)  0.5 % ophthalmic solution Place 1 drop into both eyes 4 (four) times daily. 06/17/16   Kandra Nicolas, MD    Family History Family History  Problem Relation Age of Onset  . Breast cancer Paternal Grandmother   . Brain cancer Paternal Uncle   . Diabetes Sister   . Anuerysm Paternal Aunt   . COPD Brother   . COPD Father   . Cerebral aneurysm Mother   . Heart disease Mother   . Colon cancer Neg Hx   . Pancreatic cancer Neg Hx   . Stomach cancer Neg Hx     Social History Social History  Substance Use Topics  . Smoking status: Current Every Day Smoker    Packs/day: 0.50    Years: 45.00    Types: Cigarettes    Last attempt to quit: 11/05/2013  . Smokeless tobacco: Never Used  . Alcohol use 6.0 oz/week    10 Shots of liquor per week     Comment: 5 drinks per week     Allergies   Codeine   Review of Systems Review of Systems No sore throat No cough No pleuritic pain No wheezing + nasal congestion + post-nasal drainage No sinus pain/pressure + itchy/red eyes No earache No hemoptysis No SOB No fever/chills No nausea No vomiting No abdominal  pain No diarrhea No urinary symptoms No skin rash + fatigue No myalgias No headache    Physical Exam Triage Vital Signs ED Triage Vitals  Enc Vitals Group     BP 06/17/16 1519 175/96     Pulse Rate 06/17/16 1519 92     Resp 06/17/16 1519 16     Temp 06/17/16 1519 98.2 F (36.8 C)     Temp Source 06/17/16 1519 Oral     SpO2 06/17/16 1519 100 %     Weight 06/17/16 1519 164 lb (74.4 kg)     Height 06/17/16 1519 5\' 2"  (1.575 m)     Head Circumference --      Peak Flow --      Pain Score 06/17/16 1523 0     Pain Loc --      Pain Edu? --      Excl. in [place]? --    No data found.   Updated Vital Signs BP 175/96 (BP Location: Left Arm)   Pulse 92   Temp 98.2 F (36.8 C) (Oral)   Resp 16   Ht 5\' 2"  (1.575 m)   Wt 164 lb (74.4 kg)   SpO2 100%   BMI 30.00 kg/m   Visual Acuity Right Eye  Distance: 20/20 Left Eye Distance: 20/20 Bilateral Distance: 20/20 (w/o correction)  Right Eye Near:   Left Eye Near:    Bilateral Near:     Physical Exam Nursing notes and Vital Signs reviewed. Appearance:  Patient appears stated age, and in no acute distress Eyes:  Pupils are equal, round, and reactive to light and accomodation.  Extraocular movement is intact.  Both conjunctivae mildly injected, more prominent on left.  No photophobia.  Fundi benign.  No lid swelling or tenderness. Ears:  Canals normal.  Tympanic membranes normal.  Nose:  Mildly congested turbinates.  No sinus tenderness.   Pharynx:  Normal Neck:  Supple.  Tender enlarged posterior/lateral nodes are palpated bilaterally  Lungs:  Clear to auscultation.  Breath sounds are equal.  Moving air well. Heart:  Regular rate and rhythm without murmurs, rubs, or gallops.  Abdomen:  Nontender without masses or hepatosplenomegaly.  Bowel sounds are present.  No CVA or flank tenderness.  Extremities:  No edema.  Skin:  No rash present.    UC Treatments / Results  Labs (all labs ordered are listed, but only abnormal results are displayed) Labs Reviewed - No data to display  EKG  EKG Interpretation None       Radiology No results found.  Procedures Procedures (including critical care time)  Medications Ordered in UC Medications - No data to display   Initial Impression / Assessment and Plan / UC Course  I have reviewed the triage vital signs and the nursing notes.  Pertinent labs & imaging results that were available during my care of the patient were reviewed by me and considered in my medical decision making (see chart for details).  Clinical Course       Final Clinical Impressions(s) / UC Diagnoses   Final diagnoses:  Acute viral conjunctivitis of both eyes   Suspect early viral URI Begin Acular ophthalmic suspension If eyelids are swollen in the morning, apply cool compresses.  If cold-like  symptoms develop, try the following: Take plain guaifenesin (1200mg  extended release tabs such as Mucinex) twice daily, with plenty of water, for cough and congestion.  May add Pseudoephedrine (30mg , one or two every 4 to 6 hours) for sinus congestion.  Get adequate rest.   May use Afrin nasal spray (or generic oxymetazoline) twice daily for about 5 days and then discontinue.  Also recommend using saline nasal spray several times daily and saline nasal irrigation (AYR is a common brand).  Use Flonase nasal spray each morning after using Afrin nasal spray and saline nasal irrigation. Try warm salt water gargles for sore throat.  Stop all antihistamines for now, and other non-prescription cough/cold preparations. May take Ibuprofen 200mg , 4 tabs every 8 hours with food for sore throat, headache, etc.   Follow-up with family doctor if not improving about 7 to 10 days.   New Prescriptions New Prescriptions   KETOROLAC (ACULAR) 0.5 % OPHTHALMIC SOLUTION    Place 1 drop into both eyes 4 (four) times daily.     Kandra Nicolas, MD 07/16/16 [phone]

## 2016-06-17 NOTE — Discharge Instructions (Signed)
If eyelids are swollen in the morning, apply cool compresses.  if cold-like symptoms develop, try the following: Take plain guaifenesin (1200mg  extended release tabs such as Mucinex) twice daily, with plenty of water, for cough and congestion.  May add Pseudoephedrine (30mg , one or two every 4 to 6 hours) for sinus congestion.  Get adequate rest.   May use Afrin nasal spray (or generic oxymetazoline) twice daily for about 5 days and then discontinue.  Also recommend using saline nasal spray several times daily and saline nasal irrigation (AYR is a common brand).  Use Flonase nasal spray each morning after using Afrin nasal spray and saline nasal irrigation. Try warm salt water gargles for sore throat.  Stop all antihistamines for now, and other non-prescription cough/cold preparations. May take Ibuprofen 200mg , 4 tabs every 8 hours with food for sore throat, headache, etc.   Follow-up with family doctor if not improving about 7 to 10 days.

## 2016-09-07 DIAGNOSIS — R1011 Right upper quadrant pain: Secondary | ICD-10-CM | POA: Diagnosis not present

## 2016-09-07 DIAGNOSIS — N390 Urinary tract infection, site not specified: Secondary | ICD-10-CM | POA: Diagnosis not present

## 2016-09-07 DIAGNOSIS — M545 Low back pain: Secondary | ICD-10-CM | POA: Diagnosis not present

## 2016-09-08 DIAGNOSIS — R1011 Right upper quadrant pain: Secondary | ICD-10-CM | POA: Diagnosis not present

## 2016-09-08 DIAGNOSIS — I700 Atherosclerosis of aorta: Secondary | ICD-10-CM | POA: Diagnosis not present

## 2017-03-22 ENCOUNTER — Encounter: Payer: Self-pay | Admitting: Emergency Medicine

## 2017-03-22 ENCOUNTER — Emergency Department (INDEPENDENT_AMBULATORY_CARE_PROVIDER_SITE_OTHER)
Admission: EM | Admit: 2017-03-22 | Discharge: 2017-03-22 | Disposition: A | Discharge status: Home / Self Care | Payer: BLUE CROSS/BLUE SHIELD | Source: Home / Self Care | Attending: Family Medicine | Admitting: Family Medicine

## 2017-03-22 DIAGNOSIS — R030 Elevated blood-pressure reading, without diagnosis of hypertension: Secondary | ICD-10-CM | POA: Diagnosis not present

## 2017-03-22 DIAGNOSIS — M545 Low back pain, unspecified: Secondary | ICD-10-CM

## 2017-03-22 DIAGNOSIS — R21 Rash and other nonspecific skin eruption: Secondary | ICD-10-CM | POA: Diagnosis not present

## 2017-03-22 DIAGNOSIS — S0001XA Abrasion of scalp, initial encounter: Secondary | ICD-10-CM

## 2017-03-22 DIAGNOSIS — W010XXA Fall on same level from slipping, tripping and stumbling without subsequent striking against object, initial encounter: Secondary | ICD-10-CM

## 2017-03-22 MED ORDER — TRIAMCINOLONE ACETONIDE 0.1 % EX CREA: TOPICAL_CREAM | CUTANEOUS | 0 refills | Status: DC

## 2017-03-22 NOTE — ED Provider Notes (Signed)
CSN: [id number]     Arrival date & time 03/22/17  7867 History   First MD Initiated Contact with Patient 03/22/17 [phone]     Chief Complaint  Patient presents with  . Fall   (Consider location/radiation/quality/duration/timing/severity/associated sxs/prior Treatment) HPI Jane [name] is a 66 y.o. female presenting to UC with c/o wound on the back of her head and back pain secondary to a fall 2 days ago. Pt notes she was at the beach dancing with her friends. She lost her footing and fell backward, hitting her head on a glass table at her friend's house.  Denies LOC. Only scant amount of bleeding but pt wanted to be evaluated today to make sure it was safe for her to take a shower.  Back pain is only mildly aching and sore. She has not taken any pain medication today.  Pt also notes she has a dried erythematous rash above Left eyebrow she noticed about 1-2 weeks ago. Mildly itchy. No pain. No bleeding or drainage.   BP elevated. Pt notes this is common when she comes to the doctor.    Past Medical History:  Diagnosis Date  . Arthritis   . Cancer ([place])    skin nose  . Diabetes mellitus without complication ([place])   . GERD (gastroesophageal reflux disease)   . Heart murmur    when pregnant  . Hepatitis    75  from food  . History of recurrent UTIs    on  prophalatic med  . Pneumonia    hx   Past Surgical History:  Procedure Laterality Date  . CESAREAN SECTION    . TOTAL HIP ARTHROPLASTY    . TOTAL HIP ARTHROPLASTY Left 07/17/2014   Procedure: LEFT TOTAL HIP ARTHROPLASTY ANTERIOR APPROACH;  Surgeon: Mcarthur Rossetti, MD;  Location: [facility];  Service: Orthopedics;  Laterality: Left;   Family History  Problem Relation Age of Onset  . Breast cancer Paternal Grandmother   . Brain cancer Paternal Uncle   . Diabetes Sister   . Anuerysm Paternal Aunt   . COPD Brother   . COPD Father   . Cerebral aneurysm Mother   . Heart disease Mother   . Colon cancer Neg Hx   . Pancreatic  cancer Neg Hx   . Stomach cancer Neg Hx    Social History  Substance Use Topics  . Smoking status: Current Every Day Smoker    Packs/day: 0.50    Years: 45.00    Types: Cigarettes    Last attempt to quit: 11/05/2013  . Smokeless tobacco: Never Used  . Alcohol use 6.0 oz/week    10 Shots of liquor per week     Comment: 5 drinks per week   OB History    No data available     Review of Systems  Cardiovascular: Negative for chest pain and palpitations.  Musculoskeletal: Positive for back pain and myalgias. Negative for arthralgias and joint swelling.  Skin: Positive for rash and wound. Negative for color change.  Neurological: Negative for dizziness, weakness, light-headedness, numbness and headaches.    Allergies  Codeine  Home Medications   Prior to Admission medications   Medication Sig Start Date End Date Taking? Authorizing Provider  phentermine 37.5 MG capsule Take 37.5 mg by mouth every morning.   Yes Historical Provider, MD  Cholecalciferol (VITAMIN D PO) Take 1 tablet by mouth daily.    Historical Provider, MD  ketorolac (ACULAR) 0.5 % ophthalmic solution Place 1 drop into both eyes  4 (four) times daily. 06/17/16   Kandra Nicolas, MD  triamcinolone cream (KENALOG) 0.1 % Apply thin layer on rash 2 times daily For 5-7 days. 03/22/17   Noland Fordyce, PA-C   Meds Ordered and Administered this Visit  Medications - No data to display  BP (!) 166/82 (BP Location: Right Arm)   Temp 98.1 F (36.7 C) (Oral)   Wt 157 lb (71.2 kg)   SpO2 98%   BMI 28.72 kg/m  No data found.   Physical Exam  Constitutional: She is oriented to person, place, and time. She appears well-developed and well-nourished. No distress.  HENT:  Head: Normocephalic. Head is with abrasion.    Nose: Nose normal.  Superficial vertical abrasion to back of scalp. No active bleeding. No edema. No underlying crepitus.  Above Left eyebrow: 0.5cm area of dried pink skin. Non-tender.   Eyes: EOM are  normal.  Neck: Normal range of motion.  Cardiovascular: Normal rate.   Pulmonary/Chest: Effort normal. No respiratory distress.  Musculoskeletal: Normal range of motion.  Neurological: She is alert and oriented to person, place, and time.  Skin: Skin is warm and dry. She is not diaphoretic.  Psychiatric: She has a normal mood and affect. Her behavior is normal.  Nursing note and vitals reviewed.   Urgent Care Course     Procedures (including critical care time)  Labs Review Labs Reviewed - No data to display  Imaging Review No results found.  MDM   1. Fall from slip, trip, or stumble, initial encounter   2. Abrasion of scalp, initial encounter   3. Rash of face   4. Elevated blood pressure reading   5. Acute bilateral low back pain without sciatica    Pt requesting evaluation of wound on back of head after trip-fall this weekend. Wound appears to be healing well. No indication for wound closure.  Reassured pt she may gently wash wound in shower.  Rash- question if eczema.  Trial of triamcinolone cream. If not improving in 1 week, encouraged f/u with dermatology.   Back pain- may take acetaminophen and ibuprofen. May alternate cool and warm compresses for comfort.     Noland Fordyce, PA-C 03/22/17 [phone]

## 2017-03-22 NOTE — ED Triage Notes (Signed)
Pt states she fell and hit back of her head on corner of glass table x2 days ago. Denies LOC, small laceration that was cleaned and bandaged.

## 2017-04-12 ENCOUNTER — Encounter: Payer: Self-pay | Admitting: *Deleted

## 2017-04-12 ENCOUNTER — Emergency Department (INDEPENDENT_AMBULATORY_CARE_PROVIDER_SITE_OTHER)
Admission: EM | Admit: 2017-04-12 | Discharge: 2017-04-12 | Disposition: A | Discharge status: Home / Self Care | Payer: BLUE CROSS/BLUE SHIELD | Source: Home / Self Care | Attending: Family Medicine | Admitting: Family Medicine

## 2017-04-12 DIAGNOSIS — R300 Dysuria: Secondary | ICD-10-CM | POA: Diagnosis not present

## 2017-04-12 DIAGNOSIS — N3090 Cystitis, unspecified without hematuria: Secondary | ICD-10-CM

## 2017-04-12 MED ORDER — NITROFURANTOIN MONOHYD MACRO 100 MG PO CAPS: 100.0000 mg | ORAL_CAPSULE | Freq: Two times a day (BID) | ORAL | 0 refills | Status: DC

## 2017-04-12 NOTE — ED Provider Notes (Signed)
[name]    CSN: [id number] Arrival date & time: 04/12/17  0919     History   Chief Complaint Chief Complaint  Patient presents with  . Dysuria    HPI Jane [name] is a 66 y.o. female.   Patient awoke yesterday with urinary urgency and dysuria that has persisted.  She had nocturia last night.  No abdominal or pelvic pain.  No fevers, chills, and sweats    The history is provided by the patient.  Dysuria  Pain quality:  Burning Pain severity:  Mild Onset quality:  Sudden Duration:  1 day Timing:  Constant Progression:  Worsening Chronicity:  New Recent urinary tract infections: no   Relieved by:  Phenazopyridine Worsened by:  Nothing Urinary symptoms: frequent urination and hesitancy   Urinary symptoms: no discolored urine, no foul-smelling urine, no hematuria and no bladder incontinence   Associated symptoms: no abdominal pain, no fever, no flank pain, no genital lesions, no nausea, no vaginal discharge and no vomiting     Past Medical History:  Diagnosis Date  . Arthritis   . Cancer ([place])    skin nose  . Diabetes mellitus without complication ([place])   . GERD (gastroesophageal reflux disease)   . Heart murmur    when pregnant  . Hepatitis    75  from food  . History of recurrent UTIs    on  prophalatic med  . Pneumonia    hx    Patient Active Problem List   Diagnosis Date Noted  . Arthritis of left hip 07/17/2014  . 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 GERD (gastroesophageal reflux disease)   . HYPERTENSION 01/30/2010  . DIVERTICULOSIS-COLON 01/30/2010  . DIVERTICULITIS OF COLON 01/27/2010  . CHANGE IN BOWELS 01/21/2010  . ABDOMINAL PAIN-LLQ 01/21/2010  . ABDOMINAL PAIN, LOWER 01/21/2010  . UTI'S, HX OF 01/21/2010  . HYPERLIPIDEMIA 10/28/2007  . GERD 10/28/2007  . HIATAL HERNIA 10/28/2007  . IRRITABLE BOWEL SYNDROME 10/28/2007  . OSTEOARTHRITIS 10/28/2007  . OSTEOARTHRITIS 10/28/2007  . ARTHRITIS 10/28/2007  .  COLONIC POLYPS, HX OF 10/28/2007    Past Surgical History:  Procedure Laterality Date  . CESAREAN SECTION    . TOTAL HIP ARTHROPLASTY    . TOTAL HIP ARTHROPLASTY Left 07/17/2014   Procedure: LEFT TOTAL HIP ARTHROPLASTY ANTERIOR APPROACH;  Surgeon: Mcarthur Rossetti, MD;  Location: [facility];  Service: Orthopedics;  Laterality: Left;    OB History    No data available       Home Medications    Prior to Admission medications   Medication Sig Start Date End Date Taking? Authorizing Provider  Cholecalciferol (VITAMIN D PO) Take 1 tablet by mouth daily.    [provider]  ketorolac (ACULAR) 0.5 % ophthalmic solution Place 1 drop into both eyes 4 (four) times daily. 06/17/16   Kandra Nicolas, MD  nitrofurantoin, macrocrystal-monohydrate, (MACROBID) 100 MG capsule Take 1 capsule (100 mg total) by mouth 2 (two) times daily. Take with food. 04/12/17   Kandra Nicolas, MD  phentermine 37.5 MG capsule Take 37.5 mg by mouth every morning.    [provider]  triamcinolone cream (KENALOG) 0.1 % Apply thin layer on rash 2 times daily For 5-7 days. 03/22/17   Noland Fordyce, PA-C    Family History Family History  Problem Relation Age of Onset  . Breast cancer Paternal Grandmother   . Brain cancer Paternal Uncle   . Diabetes Sister   . Anuerysm  Paternal Aunt   . COPD Brother   . COPD Father   . Cerebral aneurysm Mother   . Heart disease Mother   . Colon cancer Neg Hx   . Pancreatic cancer Neg Hx   . Stomach cancer Neg Hx     Social History Social History  Substance Use Topics  . Smoking status: Current Every Day Smoker    Packs/day: 0.50    Years: 45.00    Types: Cigarettes    Last attempt to quit: 11/05/2013  . Smokeless tobacco: Never Used  . Alcohol use 6.0 oz/week    10 Shots of liquor per week     Comment: 5 drinks per week     Allergies   Codeine   Review of Systems Review of Systems  Constitutional: Negative for fever.  Gastrointestinal:  Negative for abdominal pain, nausea and vomiting.  Genitourinary: Positive for dysuria. Negative for flank pain and vaginal discharge.  All other systems reviewed and are negative.    Physical Exam Triage Vital Signs ED Triage Vitals  Enc Vitals Group     BP 04/12/17 0948 (!) 170/95     Pulse Rate 04/12/17 0948 88     Resp 04/12/17 0948 16     Temp 04/12/17 0948 98.9 F (37.2 C)     Temp Source 04/12/17 0948 Oral     SpO2 04/12/17 0948 97 %     Weight 04/12/17 0949 155 lb (70.3 kg)     Height 04/12/17 0949 5\' 2"  (1.575 m)     Head Circumference --      Peak Flow --      Pain Score 04/12/17 0949 0     Pain Loc --      Pain Edu? --      Excl. in [place]? --    No data found.   Updated Vital Signs BP (!) 170/95 (BP Location: Left Arm)   Pulse 88   Temp 98.9 F (37.2 C) (Oral)   Resp 16   Ht 5\' 2"  (1.575 m)   Wt 155 lb (70.3 kg)   SpO2 97%   BMI 28.35 kg/m   Visual Acuity Right Eye Distance:   Left Eye Distance:   Bilateral Distance:    Right Eye Near:   Left Eye Near:    Bilateral Near:     Physical Exam Nursing notes and Vital Signs reviewed. Appearance:  Patient appears stated age, and in no acute distress.    Eyes:  Pupils are equal, round, and reactive to light and accomodation.  Extraocular movement is intact.  Conjunctivae are not inflamed   Pharynx:  Normal; moist mucous membranes  Neck:  Supple.  No adenopathy Lungs:  Clear to auscultation.  Breath sounds are equal.  Moving air well. Heart:  Regular rate and rhythm without murmurs, rubs, or gallops.  Abdomen:  Nontender without masses or hepatosplenomegaly.  Bowel sounds are present.  No CVA or flank tenderness.  Extremities:  No edema.  Skin:  No rash present.     UC Treatments / Results  Labs (all labs ordered are listed, but only abnormal results are displayed) Labs Reviewed  URINE CULTURE    EKG  EKG Interpretation None       Radiology No results found.  Procedures Procedures  (including critical care time)  Medications Ordered in UC Medications - No data to display   Initial Impression / Assessment and Plan / UC Course  I have reviewed the triage vital signs and  the nursing notes.  Pertinent labs & imaging results that were available during my care of the patient were reviewed by me and considered in my medical decision making (see chart for details).    Urine culture pending. Begin Macrobid 100mg  BID for one week. Increase fluid intake. May use non-prescription AZO for about two days, if desired, to decrease urinary discomfort.  If symptoms become significantly worse during the night or over the weekend, proceed to the local emergency room.  Followup with Family Doctor if not improved in one week.     Final Clinical Impressions(s) / UC Diagnoses   Final diagnoses:  Dysuria  Cystitis    New Prescriptions New Prescriptions   NITROFURANTOIN, MACROCRYSTAL-MONOHYDRATE, (MACROBID) 100 MG CAPSULE    Take 1 capsule (100 mg total) by mouth 2 (two) times daily. Take with food.     Kandra Nicolas, MD 04/20/17 [phone]

## 2017-04-12 NOTE — Discharge Instructions (Signed)
Increase fluid intake. May use non-prescription AZO for about two days, if desired, to decrease urinary discomfort.  If symptoms become significantly worse during the night or over the weekend, proceed to the local emergency room.  

## 2017-04-12 NOTE — ED Triage Notes (Signed)
Pt c/o urinary urgency and dysuria x 1 day. Denies fever. Last dose AZO last night. No U/A performed since she is taking AZO.

## 2017-04-15 ENCOUNTER — Telehealth: Payer: Self-pay | Admitting: *Deleted

## 2017-04-15 LAB — URINE CULTURE

## 2017-04-15 NOTE — Telephone Encounter (Addendum)
Callback: No answer, LMOM to return call to office.   (cell did not have a VM, left message on home phone. Patient is on Macrobid, sensitive to [employer])

## 2017-04-16 NOTE — Telephone Encounter (Signed)
Patient called back, UCX results discussed. She is improving. Encouraged to complete antibiotic course.

## 2017-04-27 ENCOUNTER — Encounter: Payer: Self-pay | Admitting: *Deleted

## 2017-04-27 ENCOUNTER — Emergency Department
Admission: EM | Admit: 2017-04-27 | Discharge: 2017-04-27 | Disposition: A | Discharge status: Home / Self Care | Payer: BLUE CROSS/BLUE SHIELD | Source: Home / Self Care | Attending: Family Medicine | Admitting: Family Medicine

## 2017-04-27 DIAGNOSIS — N3090 Cystitis, unspecified without hematuria: Secondary | ICD-10-CM

## 2017-04-27 MED ORDER — CIPROFLOXACIN HCL 500 MG PO TABS: 500.0000 mg | ORAL_TABLET | Freq: Two times a day (BID) | ORAL | 0 refills | Status: DC

## 2017-04-27 NOTE — ED Triage Notes (Addendum)
Pt c/o dysuria x 1 day. Taking AZO.

## 2017-04-27 NOTE — Discharge Instructions (Signed)
Increase fluid intake. May use non-prescription AZO for about two days, if desired, to decrease urinary discomfort.  If symptoms become significantly worse during the night or over the weekend, proceed to the local emergency room.  

## 2017-04-27 NOTE — ED Provider Notes (Signed)
[name]    CSN: [id number] Arrival date & time: 04/27/17  1453     History   Chief Complaint Chief Complaint  Patient presents with  . Dysuria    HPI Jane [name] is a 66 y.o. female.   Patient complains of recurrent dysuria for one day.  She had been treated for cystitis on 04/12/17.   The history is provided by the patient.  Dysuria  Pain quality:  Burning Pain severity:  Mild Onset quality:  Sudden Duration:  1 day Timing:  Constant Progression:  Unchanged Chronicity:  Recurrent Recent urinary tract infections: yes   Relieved by:  Nothing Worsened by:  Nothing Ineffective treatments:  Phenazopyridine Urinary symptoms: frequent urination and hesitancy   Urinary symptoms: no discolored urine, no foul-smelling urine, no hematuria and no bladder incontinence   Associated symptoms: no abdominal pain, no fever, no flank pain, no genital lesions, no nausea, no vaginal discharge and no vomiting   Risk factors: recurrent urinary tract infections     Past Medical History:  Diagnosis Date  . Arthritis   . Cancer ([place])    skin nose  . Diabetes mellitus without complication ([place])   . GERD (gastroesophageal reflux disease)   . Heart murmur    when pregnant  . Hepatitis    75  from food  . History of recurrent UTIs    on  prophalatic med  . Pneumonia    hx    Patient Active Problem List   Diagnosis Date Noted  . Arthritis of left hip 07/17/2014  . 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 GERD (gastroesophageal reflux disease)   . HYPERTENSION 01/30/2010  . DIVERTICULOSIS-COLON 01/30/2010  . DIVERTICULITIS OF COLON 01/27/2010  . CHANGE IN BOWELS 01/21/2010  . ABDOMINAL PAIN-LLQ 01/21/2010  . ABDOMINAL PAIN, LOWER 01/21/2010  . UTI'S, HX OF 01/21/2010  . HYPERLIPIDEMIA 10/28/2007  . GERD 10/28/2007  . HIATAL HERNIA 10/28/2007  . IRRITABLE BOWEL SYNDROME 10/28/2007  . OSTEOARTHRITIS 10/28/2007  . OSTEOARTHRITIS 10/28/2007  .  ARTHRITIS 10/28/2007  . COLONIC POLYPS, HX OF 10/28/2007    Past Surgical History:  Procedure Laterality Date  . CESAREAN SECTION    . TOTAL HIP ARTHROPLASTY    . TOTAL HIP ARTHROPLASTY Left 07/17/2014   Procedure: LEFT TOTAL HIP ARTHROPLASTY ANTERIOR APPROACH;  Surgeon: Mcarthur Rossetti, MD;  Location: [facility];  Service: Orthopedics;  Laterality: Left;    OB History    No data available       Home Medications    Prior to Admission medications   Medication Sig Start Date End Date Taking? Authorizing Provider  Cholecalciferol (VITAMIN D PO) Take 1 tablet by mouth daily.    [provider]  ciprofloxacin (CIPRO) 500 MG tablet Take 1 tablet (500 mg total) by mouth 2 (two) times daily. 04/27/17   Kandra Nicolas, MD  ketorolac (ACULAR) 0.5 % ophthalmic solution Place 1 drop into both eyes 4 (four) times daily. 06/17/16   Kandra Nicolas, MD  phentermine 37.5 MG capsule Take 37.5 mg by mouth every morning.    [provider]  triamcinolone cream (KENALOG) 0.1 % Apply thin layer on rash 2 times daily For 5-7 days. 03/22/17   Noland Fordyce, PA-C    Family History Family History  Problem Relation Age of Onset  . Breast cancer Paternal Grandmother   . Brain cancer Paternal Uncle   . Diabetes Sister   . Anuerysm Paternal Aunt   .  COPD Brother   . COPD Father   . Cerebral aneurysm Mother   . Heart disease Mother   . Colon cancer Neg Hx   . Pancreatic cancer Neg Hx   . Stomach cancer Neg Hx     Social History Social History  Substance Use Topics  . Smoking status: Current Every Day Smoker    Packs/day: 0.50    Years: 45.00    Types: Cigarettes    Last attempt to quit: 11/05/2013  . Smokeless tobacco: Never Used  . Alcohol use 6.0 oz/week    10 Shots of liquor per week     Comment: 5 drinks per week     Allergies   Codeine   Review of Systems Review of Systems  Constitutional: Negative for fever.  Gastrointestinal: Negative for abdominal pain,  nausea and vomiting.  Genitourinary: Positive for dysuria. Negative for flank pain and vaginal discharge.  All other systems reviewed and are negative.    Physical Exam Triage Vital Signs ED Triage Vitals  Enc Vitals Group     BP 04/27/17 1528 (!) 175/95     Pulse Rate 04/27/17 1528 99     Resp 04/27/17 1528 16     Temp 04/27/17 1528 97.6 F (36.4 C)     Temp Source 04/27/17 1528 Oral     SpO2 04/27/17 1528 97 %     Weight 04/27/17 1529 153 lb (69.4 kg)     Height --      Head Circumference --      Peak Flow --      Pain Score 04/27/17 1529 0     Pain Loc --      Pain Edu? --      Excl. in [place]? --    No data found.   Updated Vital Signs BP (!) 175/95 (BP Location: Left Arm)   Pulse 99   Temp 97.6 F (36.4 C) (Oral)   Resp 16   Wt 153 lb (69.4 kg)   SpO2 97%   BMI 27.98 kg/m   Visual Acuity Right Eye Distance:   Left Eye Distance:   Bilateral Distance:    Right Eye Near:   Left Eye Near:    Bilateral Near:     Physical Exam Nursing notes and Vital Signs reviewed. Appearance:  Patient appears stated age, and in no acute distress.    Eyes:  Pupils are equal, round, and reactive to light and accomodation.  Extraocular movement is intact.  Conjunctivae are not inflamed   Pharynx:  Normal; moist mucous membranes  Neck:  Supple.  No adenopathy Lungs:  Clear to auscultation.  Breath sounds are equal.  Moving air well. Heart:  Regular rate and rhythm without murmurs, rubs, or gallops.  Abdomen:  Nontender without masses or hepatosplenomegaly.  Bowel sounds are present.  No CVA or flank tenderness.  Extremities:  No edema.  Skin:  No rash present.     UC Treatments / Results  Labs (all labs ordered are listed, but only abnormal results are displayed) Labs Reviewed  URINE CULTURE    EKG  EKG Interpretation None       Radiology No results found.  Procedures Procedures (including critical care time)  Medications Ordered in UC Medications - No data  to display   Initial Impression / Assessment and Plan / UC Course  I have reviewed the triage vital signs and the nursing notes.  Pertinent labs & imaging results that were available during my care of  the patient were reviewed by me and considered in my medical decision making (see chart for details).    Recurrent UTI Urine culture pending. Begin Cipro 500mg  BID Increase fluid intake.  May use non-prescription AZO for about two days, if desired, to decrease urinary discomfort.  If symptoms become significantly worse during the night or over the weekend, proceed to the local emergency room.  Followup with Family Doctor if not improved in six days.    Final Clinical Impressions(s) / UC Diagnoses   Final diagnoses:  Cystitis    New Prescriptions New Prescriptions   CIPROFLOXACIN (CIPRO) 500 MG TABLET    Take 1 tablet (500 mg total) by mouth 2 (two) times daily.     Kandra Nicolas, MD 04/30/17 1041

## 2017-04-28 LAB — URINE CULTURE: ORGANISM ID, BACTERIA: NO GROWTH

## 2017-04-29 ENCOUNTER — Telehealth: Payer: Self-pay

## 2017-04-29 NOTE — Telephone Encounter (Signed)
Left message for patient with lab results and to follow up as needed.

## 2017-05-03 DIAGNOSIS — R3121 Asymptomatic microscopic hematuria: Secondary | ICD-10-CM | POA: Diagnosis not present

## 2017-05-03 DIAGNOSIS — N3020 Other chronic cystitis without hematuria: Secondary | ICD-10-CM | POA: Diagnosis not present

## 2017-06-04 DIAGNOSIS — N3020 Other chronic cystitis without hematuria: Secondary | ICD-10-CM | POA: Diagnosis not present

## 2017-06-14 ENCOUNTER — Other Ambulatory Visit: Payer: Self-pay | Admitting: Family Medicine

## 2017-06-14 DIAGNOSIS — Z1231 Encounter for screening mammogram for malignant neoplasm of breast: Secondary | ICD-10-CM

## 2017-06-22 ENCOUNTER — Ambulatory Visit
Admission: RE | Admit: 2017-06-22 | Discharge: 2017-06-22 | Disposition: A | Discharge status: Home / Self Care | Payer: BLUE CROSS/BLUE SHIELD | Source: Ambulatory Visit | Attending: Family Medicine | Admitting: Family Medicine

## 2017-06-22 DIAGNOSIS — Z1231 Encounter for screening mammogram for malignant neoplasm of breast: Secondary | ICD-10-CM | POA: Diagnosis not present

## 2017-07-16 ENCOUNTER — Ambulatory Visit (INDEPENDENT_AMBULATORY_CARE_PROVIDER_SITE_OTHER): Payer: BLUE CROSS/BLUE SHIELD | Admitting: Orthopedic Surgery

## 2017-07-16 ENCOUNTER — Ambulatory Visit (INDEPENDENT_AMBULATORY_CARE_PROVIDER_SITE_OTHER): Payer: BLUE CROSS/BLUE SHIELD

## 2017-07-16 ENCOUNTER — Ambulatory Visit (INDEPENDENT_AMBULATORY_CARE_PROVIDER_SITE_OTHER): Payer: Self-pay

## 2017-07-16 ENCOUNTER — Encounter (INDEPENDENT_AMBULATORY_CARE_PROVIDER_SITE_OTHER): Payer: Self-pay | Admitting: Orthopedic Surgery

## 2017-07-16 ENCOUNTER — Encounter: Payer: Self-pay | Admitting: Neurology

## 2017-07-16 DIAGNOSIS — M25552 Pain in left hip: Secondary | ICD-10-CM | POA: Diagnosis not present

## 2017-07-16 DIAGNOSIS — Z7689 Persons encountering health services in other specified circumstances: Secondary | ICD-10-CM

## 2017-07-16 DIAGNOSIS — M25512 Pain in left shoulder: Secondary | ICD-10-CM

## 2017-07-16 DIAGNOSIS — M25511 Pain in right shoulder: Secondary | ICD-10-CM | POA: Diagnosis not present

## 2017-07-16 DIAGNOSIS — M25551 Pain in right hip: Secondary | ICD-10-CM | POA: Diagnosis not present

## 2017-07-18 NOTE — Progress Notes (Signed)
Office Visit Note   Patient: Jane [name]           Date of Birth: 1951/06/20           MRN: [id number] Visit Date: 07/16/2017 Requested by: Hulan Fess, MD [place], [place] [id number] PCP: Hulan Fess, MD  Subjective: Chief Complaint  Patient presents with  . Hip Pain    bilateral  . Shoulder Pain    bilateral    QPY:PPJKD is a 66 year old patient with bilateral hip pain.  She had left total hip replacement done anterior approach several years ago which is not too happy with that because of a leg length discrepancy.  Had right total hip replacement done through posterior approach and she's happier with that one.  She states her balance is off.  She denies any groin pain.  States it hurts for her to move both hips in certain ways.  She is taking ibuprofen for the problem.  She feels like the right hip may be locking.  She does have a heel lift in the right leg.  She has a full physical scheduled for September.  She denies much in the way back pain.  Patient also reports bilateral shoulder pain which is been there for months with no history of injury.  Pain radiates down the arms but not so much below the elbows.  Hurts for her to lay on either side of her hip.              ROS: All systems reviewed are negative as they relate to the chief complaint within the history of present illness.  Patient denies  fevers or chills.   Assessment & Plan: Visit Diagnoses:  1. Bilateral hip pain   2. Bilateral shoulder pain, unspecified chronicity   3. Referral of patient     Plan: Impression is bilateral hip pain with normal exam and radiographs.  I do want to increase her heel lift on the right to make it feel more balanced.  Used 7/16 inch heel lift which did give her a better looking striding gait.  She is however having some balance issues which I noticed when she was walking.  She has a little bit of hip flexor weakness in terms of being able to get up from the seated  position but her balance is also off.  I think she needs neurological evaluation for these balance issues.  Shoulder radiographs are normal hip radiographs are normal.  She may be putting more stress on her shoulders getting up.  I don't think there is too much intervention required on the shoulders other than injections which she doesn't really want to do at this time.  No evidence of rotator cuff weakness.  In regards to the hip the question is whether not she has any infection and that it's unlikely with no systemic symptoms.  Talked about blood work and bone scan to rule out infection and loosening but she wants to hold off on either of those interventions.  I do want to send her to a neurologist to evaluate these balance issues and hopefully she will be helped some by the increased depth of the heel lift on the right.  I'll see her back if she wants to get some injections in the shoulders.  Follow-Up Instructions: No Follow-up on file.   Orders:  Orders Placed This Encounter  Procedures  . XR HIPS BILAT W OR W/O PELVIS 3-4 VIEWS  . XR Shoulder Right  .  XR Shoulder Left  . Ambulatory referral to Neurology   No orders of the defined types were placed in this encounter.     Procedures: No procedures performed   Clinical Data: No additional findings.  Objective: Vital Signs: There were no vitals taken for this visit.  Physical Exam:   Constitutional: Patient appears well-developed HEENT:  Head: Normocephalic Eyes:EOM are normal Neck: Normal range of motion Cardiovascular: Normal rate Pulmonary/chest: Effort normal Neurologic: Patient is alert Skin: Skin is warm Psychiatric: Patient has normal mood and affect    Ortho Exam: Orthopedic exam demonstrates no Trendelenburg type gait when she limps but she does have balance issues when I watched her walk down the hall.  She is not using any assistive devices.  She has pretty reasonable ankle dorsi and plantar flexion quite  hamstring strength and not much in the way of definitive hip flexor weakness other than possibly 5 minus out of 5 strength bilaterally.  There is no groin pain on either side with rotation and no paresthesias.  No other masses lymph adenopathy or skin changes noted in the hip region.  Both shoulders are examined she has full active and passive range of motion with no course grinding or crepitus and no rotator cuff weakness to isolated infraspinatus supraspinatus and subscap muscle testing.  No restriction of external rotation at 15 of abduction.  Specialty Comments:  No specialty comments available.  Imaging: No results found.   PMFS History: Patient Active Problem List   Diagnosis Date Noted  . Arthritis of left hip 07/17/2014  . 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 GERD (gastroesophageal reflux disease)   . HYPERTENSION 01/30/2010  . DIVERTICULOSIS-COLON 01/30/2010  . DIVERTICULITIS OF COLON 01/27/2010  . CHANGE IN BOWELS 01/21/2010  . ABDOMINAL PAIN-LLQ 01/21/2010  . ABDOMINAL PAIN, LOWER 01/21/2010  . UTI'S, HX OF 01/21/2010  . HYPERLIPIDEMIA 10/28/2007  . GERD 10/28/2007  . HIATAL HERNIA 10/28/2007  . IRRITABLE BOWEL SYNDROME 10/28/2007  . OSTEOARTHRITIS 10/28/2007  . OSTEOARTHRITIS 10/28/2007  . ARTHRITIS 10/28/2007  . COLONIC POLYPS, HX OF 10/28/2007   Past Medical History:  Diagnosis Date  . Arthritis   . Cancer ([place] [facility])    skin nose  . Diabetes mellitus without complication ([place])   . GERD (gastroesophageal reflux disease)   . Heart murmur    when pregnant  . Hepatitis    75  from food  . History of recurrent UTIs    on  prophalatic med  . Pneumonia    hx    Family History  Problem Relation Age of Onset  . Breast cancer Paternal Grandmother   . Brain cancer Paternal Uncle   . Diabetes Sister   . Anuerysm Paternal Aunt   . COPD Brother   . COPD Father   . Cerebral aneurysm Mother   . Heart disease Mother   . Colon cancer Neg Hx   .  Pancreatic cancer Neg Hx   . Stomach cancer Neg Hx     Past Surgical History:  Procedure Laterality Date  . CESAREAN SECTION    . TOTAL HIP ARTHROPLASTY    . TOTAL HIP ARTHROPLASTY Left 07/17/2014   Procedure: LEFT TOTAL HIP ARTHROPLASTY ANTERIOR APPROACH;  Surgeon: Mcarthur Rossetti, MD;  Location: [facility];  Service: Orthopedics;  Laterality: Left;   Social History   Occupational History  . Not on file.   Social History Main Topics  . Smoking status: Current Every Day Smoker    Packs/day:  0.50    Years: 45.00    Types: Cigarettes    Last attempt to quit: 11/05/2013  . Smokeless tobacco: Never Used  . Alcohol use 6.0 oz/week    10 Shots of liquor per week     Comment: 5 drinks per week  . Drug use: No  . Sexual activity: Not on file

## 2017-08-06 ENCOUNTER — Ambulatory Visit: Payer: BLUE CROSS/BLUE SHIELD | Admitting: Neurology

## 2017-08-18 DIAGNOSIS — R7303 Prediabetes: Secondary | ICD-10-CM | POA: Diagnosis not present

## 2017-08-18 DIAGNOSIS — I700 Atherosclerosis of aorta: Secondary | ICD-10-CM | POA: Diagnosis not present

## 2017-08-18 DIAGNOSIS — E7800 Pure hypercholesterolemia, unspecified: Secondary | ICD-10-CM | POA: Diagnosis not present

## 2017-08-18 DIAGNOSIS — Z0000 Encounter for general adult medical examination without abnormal findings: Secondary | ICD-10-CM | POA: Diagnosis not present

## 2017-08-18 DIAGNOSIS — E559 Vitamin D deficiency, unspecified: Secondary | ICD-10-CM | POA: Diagnosis not present

## 2017-08-19 ENCOUNTER — Other Ambulatory Visit: Payer: Self-pay | Admitting: Family Medicine

## 2017-08-19 DIAGNOSIS — E2839 Other primary ovarian failure: Secondary | ICD-10-CM

## 2017-09-13 ENCOUNTER — Ambulatory Visit
Admission: RE | Admit: 2017-09-13 | Discharge: 2017-09-13 | Disposition: A | Discharge status: Home / Self Care | Payer: BLUE CROSS/BLUE SHIELD | Source: Ambulatory Visit | Attending: Family Medicine | Admitting: Family Medicine

## 2017-09-13 DIAGNOSIS — M8589 Other specified disorders of bone density and structure, multiple sites: Secondary | ICD-10-CM | POA: Diagnosis not present

## 2017-09-13 DIAGNOSIS — Z780 Asymptomatic menopausal state: Secondary | ICD-10-CM | POA: Diagnosis not present

## 2017-09-13 DIAGNOSIS — E2839 Other primary ovarian failure: Secondary | ICD-10-CM

## 2017-10-05 DIAGNOSIS — N3020 Other chronic cystitis without hematuria: Secondary | ICD-10-CM | POA: Diagnosis not present

## 2017-11-10 ENCOUNTER — Ambulatory Visit: Payer: BLUE CROSS/BLUE SHIELD | Admitting: Neurology

## 2017-11-10 ENCOUNTER — Encounter: Payer: Self-pay | Admitting: Neurology

## 2017-11-10 VITALS — BP 142/96 | HR 88 | Ht 62.0 in | Wt 155.8 lb

## 2017-11-10 DIAGNOSIS — Z720 Tobacco use: Secondary | ICD-10-CM

## 2017-11-10 DIAGNOSIS — R2681 Unsteadiness on feet: Secondary | ICD-10-CM | POA: Diagnosis not present

## 2017-11-10 DIAGNOSIS — R42 Dizziness and giddiness: Secondary | ICD-10-CM | POA: Diagnosis not present

## 2017-11-10 DIAGNOSIS — H5500 Unspecified nystagmus: Secondary | ICD-10-CM

## 2017-11-10 NOTE — Patient Instructions (Addendum)
I think you are having vertigo. 1.  I want to check MRI and MRA of the head to rule out potential causes 2.  If unremarkable, I will send you for physical therapy to treat dizziness. 3.  Blood pressure a little high.  Follow up with PCP

## 2017-11-10 NOTE — Progress Notes (Signed)
NEUROLOGY CONSULTATION NOTE  Jane [name] MRN: [id number] DOB: [birth year]  Referring provider: Marcene Duos, MD Primary care provider: Hulan Fess, MD  Reason for consult:  Unsteady gait  HISTORY OF PRESENT ILLNESS: Jane [name] is a 66 year old female who presents for problems with balance.  History supplemented by orthopedics note.  Bilateral hip and pelvis radiographs from 07/18/17 were personally reviewed.  She has had dizziness and unsteady gait for at least a year following her last hip surgery a couple of years ago.  She reports intermittent dizziness described as a spinning sensation lasting briefly for just a few seconds.  It occurs spontaneously if she is just sitting or standing still.  It resolves when she holds on to something.  There is no associated nausea, vomiting, double vision, slurred speech, headache, tinnitus or unilateral numbness or weakness.  She does have some hearing loss.  It occurs about 3 to 4 times a week.  She reports prior history of vertigo which was different, as that was more severe and associated with nausea.  She fell down the basement stairs over a year ago and hit her head, but otherwise she has not had falls.  She has bilateral total hip replacement with subsequent chronic bilateral hip pain.  Following the left hip replacement a couple of years ago, she has had leg length discrepancy, her left leg longer than the right leg.  She was evaluated by orthopedics and had normal radiographs of the hips and pelvis.  Her heel lift on the right was increased.  Her feet feel cold but she denies numbness.    She is concerned because her mother passed away in her forties due to a cerebral hemorrhage.  Etiology is uncertain but possibly related high blood pressure.    PAST MEDICAL HISTORY: Past Medical History:  Diagnosis Date  . Arthritis   . Cancer ([place])    skin nose  . Diabetes mellitus without complication ([place])   . GERD (gastroesophageal reflux disease)    . Heart murmur    when pregnant  . Hepatitis    75  from food  . History of recurrent UTIs    on  prophalatic med  . Pneumonia    hx    PAST SURGICAL HISTORY: Past Surgical History:  Procedure Laterality Date  . CESAREAN SECTION    . TOTAL HIP ARTHROPLASTY    . TOTAL HIP ARTHROPLASTY Left 07/17/2014   Procedure: LEFT TOTAL HIP ARTHROPLASTY ANTERIOR APPROACH;  Surgeon: Mcarthur Rossetti, MD;  Location: [facility];  Service: Orthopedics;  Laterality: Left;    MEDICATIONS: Current Outpatient Medications on File Prior to Visit  Medication Sig Dispense Refill  . omeprazole (PRILOSEC) 40 MG capsule Take 40 mg by mouth daily.    . Cholecalciferol (VITAMIN D PO) Take 1 tablet by mouth daily.    . ciprofloxacin (CIPRO) 500 MG tablet Take 1 tablet (500 mg total) by mouth 2 (two) times daily. (Patient not taking: Reported on 11/10/2017) 10 tablet 0  . ketorolac (ACULAR) 0.5 % ophthalmic solution Place 1 drop into both eyes 4 (four) times daily. (Patient not taking: Reported on 11/10/2017) 3 mL 0  . nitrofurantoin (MACRODANTIN) 50 MG capsule     . nitrofurantoin, macrocrystal-monohydrate, (MACROBID) 100 MG capsule     . phentermine (ADIPEX-P) 37.5 MG tablet Take 37.5 mg by mouth daily.  0  . phentermine 37.5 MG capsule Take 37.5 mg by mouth every morning.    . triamcinolone cream (KENALOG) 0.1 %  Apply thin layer on rash 2 times daily For 5-7 days. 15 g 0   No current facility-administered medications on file prior to visit.     ALLERGIES: Allergies  Allergen Reactions  . Codeine Other (See Comments)    Rapid heartbeat, shortness of breath    FAMILY HISTORY: Family History  Problem Relation Age of Onset  . Breast cancer Paternal Grandmother   . Brain cancer Paternal Uncle   . Diabetes Sister   . Anuerysm Paternal Aunt   . COPD Brother   . Heart disease Brother        MI x3  . COPD Father   . Cerebral aneurysm Mother   . Heart disease Mother   . Colon cancer Neg Hx   .  Pancreatic cancer Neg Hx   . Stomach cancer Neg Hx     SOCIAL HISTORY: Social History   Socioeconomic History  . Marital status: Married    Spouse name: Jacqulynn Cadet  . Number of children: 2  . Years of education: Not on file  . Highest education level: 12th grade  Social Needs  . Financial resource strain: Not on file  . Food insecurity - worry: Not on file  . Food insecurity - inability: Not on file  . Transportation needs - medical: Not on file  . Transportation needs - non-medical: Not on file  Occupational History  . 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 Smoking status: Current Every Day Smoker    Packs/day: 0.50    Years: 45.00    Pack years: 22.50    Types: Cigarettes  . Smokeless tobacco: Never Used  Substance and Sexual Activity  . Alcohol use: Yes    Alcohol/week: 6.0 oz    Types: 10 Shots of liquor per week    Comment: 5 drinks per week  . Drug use: No  . Sexual activity: Not on file  Other Topics Concern  . Not on file  Social History Narrative   Married, lives with husband in a one level home. She drinks 1 cup of coffee and 2 20oz bottles of caffienated soda a day. She does not exercise, but is active at work.     REVIEW OF SYSTEMS: Constitutional: No fevers, chills, or sweats, no generalized fatigue, change in appetite Eyes: No visual changes, double vision, eye pain Ear, nose and throat: No hearing loss, ear pain, nasal congestion, sore throat Cardiovascular: No chest pain, palpitations Respiratory:  No shortness of breath at rest or with exertion, wheezes GastrointestinaI: No nausea, vomiting, diarrhea, abdominal pain, fecal incontinence Genitourinary:  No dysuria, urinary retention or frequency Musculoskeletal:  No neck pain, back pain Integumentary: No rash, pruritus, skin lesions Neurological: as above Psychiatric: No depression, insomnia, anxiety Endocrine: No palpitations, fatigue, diaphoresis, mood swings, change in appetite, change in  weight, increased thirst Hematologic/Lymphatic:  No purpura, petechiae. Allergic/Immunologic: no itchy/runny eyes, nasal congestion, recent allergic reactions, rashes  PHYSICAL EXAM: Vitals:   11/10/17 0959  BP: (!) 142/96  Pulse: 88  SpO2: 99%   General: No acute distress.  Patient appears well-groomed.   Head:  Normocephalic/atraumatic Eyes:  fundi examined but not visualized Neck: supple, no paraspinal tenderness, full range of motion Back: No paraspinal tenderness Heart: regular rate and rhythm Lungs: Clear to auscultation bilaterally. Vascular: No carotid bruits. Neurological Exam: Mental status: alert and oriented to person, place, and time, recent and remote memory intact, fund of knowledge intact, attention and concentration intact, speech fluent and not dysarthric, language intact. Cranial nerves:  CN I: not tested CN II: pupils equal, round and reactive to light, visual fields intact CN III, IV, VI:  full range of motion, no nystagmus, right ptosis (chronic) CN V: facial sensation intact CN VII: upper and lower face symmetric CN VIII: hearing intact CN IX, X: gag intact, uvula midline CN XI: sternocleidomastoid and trapezius muscles intact CN XII: tongue midline Bulk & Tone: normal, no fasciculations. Motor:  5/5 throughout  Sensation:  Pinprick sensation intact.  Mildly reduced vibration sensation in toes bilaterally. Deep Tendon Reflexes:  2+ throughout, toes downgoing.  Finger to nose testing:  Without dysmetria.  Heel to shin:  Without dysmetria.  Gait:  Trendelenburg gait with internal rotation of the right leg/foot.  Able to turn, unable to tandem walk. Romberg negative. Dix-Hallpike:  Negative when brought head down but developed vertigo when rising from supine position, causing bilateral nystagmus lasting up to 3-4 minutes. Head Impulse Test:  Positive bilaterally  IMPRESSION: 1.  Episodic vertigo.  She describes spontaneous onset not positional, however the  vertigo was reproduced on exam with change in position.  I think the vertigo is peripheral, as demonstrated on Head Impulse Test.  While the nystagmus and vertigo wasn't reproduced with Dix-Hallpike when moving from sitting to supine position, she did demonstrate it when rising from supine to sitting.  She exhibited bilateral nystagmus which did not fatigue after a minute, which is unusual for peripheral etiology.  Therefore, I think brain imaging is warranted. 2.  Gait instability.  I think her unsteady gait is related to her leg length discrepancy, which makes her prone to worsening of balance during vertigo attacks. 3.  HTN 4.  Tobacco use  PLAN: 1.  We will get MRI and MRA of brain to rule out intracranial abnormality or vertebrobasilar insufficiency as cause for her vertigo. 2.  If unremarkable, would refer for vestibular rehab. 3.  Advised to follow up blood pressure with PCP. 4.  Smoking cessation  Thank you for allowing me to take part in the care of this patient.  Metta Clines, DO  CC:  Marcene Duos, MD  Hulan Fess, MD

## 2017-11-21 ENCOUNTER — Ambulatory Visit
Admission: RE | Admit: 2017-11-21 | Discharge: 2017-11-21 | Disposition: A | Discharge status: Home / Self Care | Payer: BLUE CROSS/BLUE SHIELD | Source: Ambulatory Visit | Attending: Neurology | Admitting: Neurology

## 2017-11-21 DIAGNOSIS — R42 Dizziness and giddiness: Secondary | ICD-10-CM

## 2017-11-21 DIAGNOSIS — H5500 Unspecified nystagmus: Secondary | ICD-10-CM

## 2017-11-21 DIAGNOSIS — R2681 Unsteadiness on feet: Secondary | ICD-10-CM

## 2017-11-21 MED ORDER — GADOBENATE DIMEGLUMINE 529 MG/ML IV SOLN
14.0000 mL | Freq: Once | INTRAVENOUS | Status: AC | PRN
  Administered 2017-11-21: 14 mL via INTRAVENOUS

## 2017-11-22 ENCOUNTER — Telehealth: Payer: Self-pay

## 2017-11-22 DIAGNOSIS — R42 Dizziness and giddiness: Secondary | ICD-10-CM

## 2017-11-22 NOTE — Telephone Encounter (Signed)
Pt rtrnd call, advsd her of MRI?MRA results. Pt wants to go forward with vestibular rehab, sending referral

## 2017-11-22 NOTE — Addendum Note (Signed)
Addended by: Clois Comber on: 11/22/2017 11:01 AM   Modules accepted: Orders

## 2017-11-22 NOTE — Telephone Encounter (Signed)
LM for Pt to rtrn my call for MRI results

## 2017-11-22 NOTE — Telephone Encounter (Signed)
-----   Message from Cameron Sprang, MD sent at 11/22/2017  9:29 AM EST ----- Pls let her know the MRI and MRA brain were unremarkable. No evidence of tumor, stroke, or bleed. It showed mild changes seen inpatients with diabetes, blood pressure, or cholesterol issues. Thanks

## 2018-03-29 DIAGNOSIS — N3020 Other chronic cystitis without hematuria: Secondary | ICD-10-CM | POA: Diagnosis not present

## 2018-08-22 DIAGNOSIS — R7303 Prediabetes: Secondary | ICD-10-CM | POA: Diagnosis not present

## 2018-08-22 DIAGNOSIS — E7800 Pure hypercholesterolemia, unspecified: Secondary | ICD-10-CM | POA: Diagnosis not present

## 2018-08-22 DIAGNOSIS — Z0000 Encounter for general adult medical examination without abnormal findings: Secondary | ICD-10-CM | POA: Diagnosis not present

## 2018-08-22 DIAGNOSIS — Z23 Encounter for immunization: Secondary | ICD-10-CM | POA: Diagnosis not present

## 2018-08-22 DIAGNOSIS — K219 Gastro-esophageal reflux disease without esophagitis: Secondary | ICD-10-CM | POA: Diagnosis not present

## 2018-08-23 DIAGNOSIS — Z0000 Encounter for general adult medical examination without abnormal findings: Secondary | ICD-10-CM | POA: Diagnosis not present

## 2018-08-23 DIAGNOSIS — T148XXA Other injury of unspecified body region, initial encounter: Secondary | ICD-10-CM | POA: Diagnosis not present

## 2018-10-26 DIAGNOSIS — N3020 Other chronic cystitis without hematuria: Secondary | ICD-10-CM | POA: Diagnosis not present

## 2018-10-26 DIAGNOSIS — N3941 Urge incontinence: Secondary | ICD-10-CM | POA: Diagnosis not present

## 2018-12-30 DIAGNOSIS — R21 Rash and other nonspecific skin eruption: Secondary | ICD-10-CM | POA: Diagnosis not present

## 2018-12-30 DIAGNOSIS — R030 Elevated blood-pressure reading, without diagnosis of hypertension: Secondary | ICD-10-CM | POA: Diagnosis not present

## 2019-05-10 DIAGNOSIS — N3020 Other chronic cystitis without hematuria: Secondary | ICD-10-CM | POA: Diagnosis not present

## 2019-05-10 DIAGNOSIS — N3941 Urge incontinence: Secondary | ICD-10-CM | POA: Diagnosis not present

## 2019-06-13 DIAGNOSIS — R233 Spontaneous ecchymoses: Secondary | ICD-10-CM | POA: Diagnosis not present

## 2019-06-13 DIAGNOSIS — H8110 Benign paroxysmal vertigo, unspecified ear: Secondary | ICD-10-CM | POA: Diagnosis not present

## 2019-07-17 DIAGNOSIS — I10 Essential (primary) hypertension: Secondary | ICD-10-CM | POA: Diagnosis not present

## 2019-07-17 DIAGNOSIS — R600 Localized edema: Secondary | ICD-10-CM | POA: Diagnosis not present

## 2019-08-28 DIAGNOSIS — E559 Vitamin D deficiency, unspecified: Secondary | ICD-10-CM | POA: Diagnosis not present

## 2019-08-28 DIAGNOSIS — Z23 Encounter for immunization: Secondary | ICD-10-CM | POA: Diagnosis not present

## 2019-08-28 DIAGNOSIS — R609 Edema, unspecified: Secondary | ICD-10-CM | POA: Diagnosis not present

## 2019-08-28 DIAGNOSIS — R799 Abnormal finding of blood chemistry, unspecified: Secondary | ICD-10-CM | POA: Diagnosis not present

## 2019-08-28 DIAGNOSIS — E7800 Pure hypercholesterolemia, unspecified: Secondary | ICD-10-CM | POA: Diagnosis not present

## 2020-03-21 DIAGNOSIS — N3020 Other chronic cystitis without hematuria: Secondary | ICD-10-CM | POA: Diagnosis not present

## 2020-03-21 DIAGNOSIS — R3914 Feeling of incomplete bladder emptying: Secondary | ICD-10-CM | POA: Diagnosis not present

## 2020-03-21 DIAGNOSIS — N3941 Urge incontinence: Secondary | ICD-10-CM | POA: Diagnosis not present

## 2020-03-27 ENCOUNTER — Other Ambulatory Visit: Payer: Self-pay | Admitting: Family Medicine

## 2020-03-27 DIAGNOSIS — Z1231 Encounter for screening mammogram for malignant neoplasm of breast: Secondary | ICD-10-CM

## 2020-05-15 ENCOUNTER — Ambulatory Visit: Payer: BLUE CROSS/BLUE SHIELD | Admitting: Orthopedic Surgery

## 2020-05-23 ENCOUNTER — Ambulatory Visit (INDEPENDENT_AMBULATORY_CARE_PROVIDER_SITE_OTHER): Payer: BC Managed Care – PPO | Admitting: Orthopedic Surgery

## 2020-05-23 ENCOUNTER — Ambulatory Visit: Payer: Self-pay

## 2020-05-23 DIAGNOSIS — M25511 Pain in right shoulder: Secondary | ICD-10-CM

## 2020-05-23 DIAGNOSIS — M7501 Adhesive capsulitis of right shoulder: Secondary | ICD-10-CM

## 2020-05-27 ENCOUNTER — Encounter: Payer: Self-pay | Admitting: Orthopedic Surgery

## 2020-05-27 NOTE — Progress Notes (Signed)
Office Visit Note   Patient: Jane [name]           Date of Birth: 03-04-1951           MRN: [id number] Visit Date: 05/23/2020 Requested by: Hulan Fess, MD [place],  [place] [address] PCP: Hulan Fess, MD  Subjective: Chief Complaint  Patient presents with  . Right Shoulder - Pain    HPI: Jane [name] is a 69 y.o. female who presents to the office complaining of right shoulder pain.  Patient notes right shoulder pain of 1 month duration.  She denies any injury leading to onset of pain.  Pain is worsening over the last month.  She localizes pain to the lateral shoulder and the deltoid with radiation down to the wrist at times.  She denies any neck pain.  She notes that it is difficult to reach out away from her body due to pain.  She has subjective weakness.  Pain is waking her up at night.  She has no history of shoulder/neck surgery.  Putting her bra on is difficult..                ROS:  All systems reviewed are negative as they relate to the chief complaint within the history of present illness.  Patient denies fevers or chills.  Assessment & Plan: Visit Diagnoses:  1. Right shoulder pain, unspecified chronicity     Plan: Patient is a 69 year old female presents complaining of right shoulder pain.  She has insidious onset of right shoulder pain over the last month that is steadily worsening.  She denies any neck symptoms or radicular pain.  Putting her bra on is difficult.  On exam she has significantly reduced range of motion of the right shoulder compared to contralateral side.  Impression is adhesive capsulitis of the right shoulder.  Administered right shoulder glenohumeral injection.  Also prescribed a home exercise program.  Patient will follow up in 6 weeks for clinical recheck.  If no improvement will consider another injection versus formal physical therapy.  Also patient complains of right hip mechanical symptoms where she "feels like her right hip gets  stuck".  She denies any groin pain.  No pain is reproducible on exam.  This is a minor complaint of hers compared to the shoulder.  Replaced her heel lift that was given to her at her last appointment.  This is a 7/16 inch heel lift.  We will reevaluate her hip in 6 weeks as well.  Follow-Up Instructions: No follow-ups on file.   Orders:  Orders Placed This Encounter  Procedures  . XR Shoulder Right   No orders of the defined types were placed in this encounter.     Procedures: Large Joint Inj: R glenohumeral on 05/28/2020 7:20 AM Indications: diagnostic evaluation and pain Details: 18 G 1.5 in needle, posterior approach  Arthrogram: No  Medications: 9 mL bupivacaine 0.5 %; 40 mg methylPREDNISolone acetate 40 MG/ML; 5 mL lidocaine 1 % Outcome: tolerated well, no immediate complications Procedure, treatment alternatives, risks and benefits explained, specific risks discussed. Consent was given by the patient. Immediately prior to procedure a time out was called to verify the correct patient, procedure, equipment, support staff and site/side marked as required. Patient was prepped and draped in the usual sterile fashion.       Clinical Data: No additional findings.  Objective: Vital Signs: There were no vitals taken for this visit.  Physical Exam:  Constitutional: Patient appears well-developed  HEENT:  Head: Normocephalic Eyes:EOM are normal Neck: Normal range of motion Cardiovascular: Normal rate Pulmonary/chest: Effort normal Neurologic: Patient is alert Skin: Skin is warm Psychiatric: Patient has normal mood and affect  Ortho Exam:  Right shoulder Exam Passive forward flexion to 90 degrees.  Passive abduction to 70 degrees.  Passive external rotation is 10 degrees less than the contralateral side. No TTP over the [facility] joint or bicipital groove Good subscapularis, supraspinatus, and infraspinatus strength Negative Hawkins impingement 5/5 grip strength, forearm  pronation/supination, and bicep strength  No pain with internal rotation/external rotation of the right hip joint.  No significant tenderness to palpation over the greater trochanter.  Negative Stinchfield exam.  No pain with terminal forward flexion.  No tenderness over the ASIS or AIIS.  Specialty Comments:  No specialty comments available.  Imaging: No results found.   PMFS History: Patient Active Problem List   Diagnosis Date Noted  . Arthritis of left hip 07/17/2014  . 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 GERD (gastroesophageal reflux disease)   . HYPERTENSION 01/30/2010  . DIVERTICULOSIS-COLON 01/30/2010  . DIVERTICULITIS OF COLON 01/27/2010  . CHANGE IN BOWELS 01/21/2010  . ABDOMINAL PAIN-LLQ 01/21/2010  . ABDOMINAL PAIN, LOWER 01/21/2010  . UTI'S, HX OF 01/21/2010  . HYPERLIPIDEMIA 10/28/2007  . GERD 10/28/2007  . HIATAL HERNIA 10/28/2007  . IRRITABLE BOWEL SYNDROME 10/28/2007  . OSTEOARTHRITIS 10/28/2007  . OSTEOARTHRITIS 10/28/2007  . ARTHRITIS 10/28/2007  . COLONIC POLYPS, HX OF 10/28/2007   Past Medical History:  Diagnosis Date  . Arthritis   . Cancer ([facility])    skin nose  . Diabetes mellitus without complication ([facility])   . GERD (gastroesophageal reflux disease)   . Heart murmur    when pregnant  . Hepatitis    75  from food  . History of recurrent UTIs    on  prophalatic med  . Pneumonia    hx    Family History  Problem Relation Age of Onset  . Breast cancer Paternal Grandmother   . Brain cancer Paternal Uncle   . Diabetes Sister   . Anuerysm Paternal Aunt   . COPD Brother   . Heart disease Brother        MI x3  . COPD Father   . Cerebral aneurysm Mother   . Heart disease Mother   . Colon cancer Neg Hx   . Pancreatic cancer Neg Hx   . Stomach cancer Neg Hx     Past Surgical History:  Procedure Laterality Date  . CESAREAN SECTION    . TOTAL HIP ARTHROPLASTY    . TOTAL HIP ARTHROPLASTY Left 07/17/2014   Procedure: LEFT  TOTAL HIP ARTHROPLASTY ANTERIOR APPROACH;  Surgeon: Mcarthur Rossetti, MD;  Location: [facility];  Service: Orthopedics;  Laterality: Left;   Social History   Occupational History  . 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 Smoking status: Current Every Day Smoker    Packs/day: 0.50    Years: 45.00    Pack years: 22.50    Types: Cigarettes  . Smokeless tobacco: Never Used  Vaping Use  . Vaping Use: Never used  Substance and Sexual Activity  . Alcohol use: Yes    Alcohol/week: 10.0 standard drinks    Types: 10 Shots of liquor per week    Comment: 5 drinks per week  . Drug use: No  . Sexual activity: Not on file

## 2020-05-28 DIAGNOSIS — M7501 Adhesive capsulitis of right shoulder: Secondary | ICD-10-CM | POA: Diagnosis not present

## 2020-05-28 MED ORDER — METHYLPREDNISOLONE ACETATE 40 MG/ML IJ SUSP
40.0000 mg | INTRAMUSCULAR | Status: AC | PRN
  Administered 2020-05-28: 40 mg via INTRA_ARTICULAR

## 2020-05-28 MED ORDER — BUPIVACAINE HCL 0.5 % IJ SOLN
9.0000 mL | INTRAMUSCULAR | Status: AC | PRN
  Administered 2020-05-28: 9 mL via INTRA_ARTICULAR

## 2020-05-28 MED ORDER — LIDOCAINE HCL 1 % IJ SOLN
5.0000 mL | INTRAMUSCULAR | Status: AC | PRN
  Administered 2020-05-28: 5 mL

## 2020-07-04 ENCOUNTER — Ambulatory Visit: Payer: BC Managed Care – PPO | Admitting: Orthopedic Surgery

## 2020-09-30 ENCOUNTER — Ambulatory Visit (HOSPITAL_BASED_OUTPATIENT_CLINIC_OR_DEPARTMENT_OTHER): Payer: BC Managed Care – PPO

## 2021-06-02 DIAGNOSIS — N390 Urinary tract infection, site not specified: Secondary | ICD-10-CM | POA: Diagnosis not present

## 2021-06-23 DIAGNOSIS — R3914 Feeling of incomplete bladder emptying: Secondary | ICD-10-CM | POA: Diagnosis not present

## 2021-07-16 DIAGNOSIS — L720 Epidermal cyst: Secondary | ICD-10-CM | POA: Diagnosis not present

## 2021-07-16 DIAGNOSIS — I10 Essential (primary) hypertension: Secondary | ICD-10-CM | POA: Diagnosis not present

## 2021-07-16 DIAGNOSIS — Z720 Tobacco use: Secondary | ICD-10-CM | POA: Diagnosis not present

## 2021-07-16 DIAGNOSIS — N3020 Other chronic cystitis without hematuria: Secondary | ICD-10-CM | POA: Diagnosis not present

## 2021-07-16 DIAGNOSIS — R3121 Asymptomatic microscopic hematuria: Secondary | ICD-10-CM | POA: Diagnosis not present

## 2021-07-16 DIAGNOSIS — E7800 Pure hypercholesterolemia, unspecified: Secondary | ICD-10-CM | POA: Diagnosis not present

## 2021-07-16 DIAGNOSIS — R311 Benign essential microscopic hematuria: Secondary | ICD-10-CM | POA: Diagnosis not present

## 2021-07-17 ENCOUNTER — Other Ambulatory Visit: Payer: Self-pay | Admitting: Family Medicine

## 2021-07-17 ENCOUNTER — Other Ambulatory Visit (HOSPITAL_BASED_OUTPATIENT_CLINIC_OR_DEPARTMENT_OTHER): Payer: Self-pay | Admitting: Family Medicine

## 2021-07-17 ENCOUNTER — Other Ambulatory Visit (HOSPITAL_COMMUNITY): Payer: Self-pay | Admitting: Family Medicine

## 2021-07-17 DIAGNOSIS — L720 Epidermal cyst: Secondary | ICD-10-CM

## 2021-07-23 ENCOUNTER — Other Ambulatory Visit: Payer: Self-pay

## 2021-07-23 ENCOUNTER — Ambulatory Visit (INDEPENDENT_AMBULATORY_CARE_PROVIDER_SITE_OTHER): Payer: PPO

## 2021-07-23 DIAGNOSIS — R2242 Localized swelling, mass and lump, left lower limb: Secondary | ICD-10-CM | POA: Diagnosis not present

## 2021-07-23 DIAGNOSIS — L720 Epidermal cyst: Secondary | ICD-10-CM | POA: Diagnosis not present

## 2021-07-29 DIAGNOSIS — N3020 Other chronic cystitis without hematuria: Secondary | ICD-10-CM | POA: Diagnosis not present

## 2021-07-29 DIAGNOSIS — R311 Benign essential microscopic hematuria: Secondary | ICD-10-CM | POA: Diagnosis not present

## 2021-08-27 ENCOUNTER — Encounter (HOSPITAL_COMMUNITY): Payer: Self-pay | Admitting: Radiology

## 2021-09-04 DIAGNOSIS — I10 Essential (primary) hypertension: Secondary | ICD-10-CM | POA: Diagnosis not present

## 2021-09-04 DIAGNOSIS — R059 Cough, unspecified: Secondary | ICD-10-CM | POA: Diagnosis not present

## 2021-09-04 DIAGNOSIS — L720 Epidermal cyst: Secondary | ICD-10-CM | POA: Diagnosis not present

## 2021-09-04 DIAGNOSIS — U071 COVID-19: Secondary | ICD-10-CM | POA: Diagnosis not present

## 2021-09-10 DIAGNOSIS — N760 Acute vaginitis: Secondary | ICD-10-CM | POA: Diagnosis not present

## 2021-09-10 DIAGNOSIS — R82998 Other abnormal findings in urine: Secondary | ICD-10-CM | POA: Diagnosis not present

## 2021-09-10 DIAGNOSIS — R300 Dysuria: Secondary | ICD-10-CM | POA: Diagnosis not present

## 2021-10-01 DIAGNOSIS — K219 Gastro-esophageal reflux disease without esophagitis: Secondary | ICD-10-CM | POA: Diagnosis not present

## 2021-10-01 DIAGNOSIS — Z720 Tobacco use: Secondary | ICD-10-CM | POA: Diagnosis not present

## 2021-10-01 DIAGNOSIS — E7800 Pure hypercholesterolemia, unspecified: Secondary | ICD-10-CM | POA: Diagnosis not present

## 2021-10-01 DIAGNOSIS — I10 Essential (primary) hypertension: Secondary | ICD-10-CM | POA: Diagnosis not present

## 2021-10-01 DIAGNOSIS — E559 Vitamin D deficiency, unspecified: Secondary | ICD-10-CM | POA: Diagnosis not present

## 2021-10-01 DIAGNOSIS — Z23 Encounter for immunization: Secondary | ICD-10-CM | POA: Diagnosis not present

## 2021-10-01 DIAGNOSIS — R7303 Prediabetes: Secondary | ICD-10-CM | POA: Diagnosis not present

## 2021-10-06 DIAGNOSIS — R300 Dysuria: Secondary | ICD-10-CM | POA: Diagnosis not present

## 2021-10-06 DIAGNOSIS — R319 Hematuria, unspecified: Secondary | ICD-10-CM | POA: Diagnosis not present

## 2021-10-06 DIAGNOSIS — N390 Urinary tract infection, site not specified: Secondary | ICD-10-CM | POA: Diagnosis not present

## 2021-10-23 DIAGNOSIS — R311 Benign essential microscopic hematuria: Secondary | ICD-10-CM | POA: Diagnosis not present

## 2021-10-27 DIAGNOSIS — K645 Perianal venous thrombosis: Secondary | ICD-10-CM | POA: Diagnosis not present

## 2021-10-27 DIAGNOSIS — N3020 Other chronic cystitis without hematuria: Secondary | ICD-10-CM | POA: Diagnosis not present

## 2021-10-27 DIAGNOSIS — K611 Rectal abscess: Secondary | ICD-10-CM | POA: Diagnosis not present

## 2022-01-15 DIAGNOSIS — Z23 Encounter for immunization: Secondary | ICD-10-CM | POA: Diagnosis not present

## 2022-01-15 DIAGNOSIS — K219 Gastro-esophageal reflux disease without esophagitis: Secondary | ICD-10-CM | POA: Diagnosis not present

## 2022-01-15 DIAGNOSIS — Z720 Tobacco use: Secondary | ICD-10-CM | POA: Diagnosis not present

## 2022-01-15 DIAGNOSIS — E559 Vitamin D deficiency, unspecified: Secondary | ICD-10-CM | POA: Diagnosis not present

## 2022-01-15 DIAGNOSIS — E7800 Pure hypercholesterolemia, unspecified: Secondary | ICD-10-CM | POA: Diagnosis not present

## 2022-01-15 DIAGNOSIS — R7303 Prediabetes: Secondary | ICD-10-CM | POA: Diagnosis not present

## 2022-01-15 DIAGNOSIS — Z0000 Encounter for general adult medical examination without abnormal findings: Secondary | ICD-10-CM | POA: Diagnosis not present

## 2022-01-15 DIAGNOSIS — M8580 Other specified disorders of bone density and structure, unspecified site: Secondary | ICD-10-CM | POA: Diagnosis not present

## 2022-01-15 DIAGNOSIS — I10 Essential (primary) hypertension: Secondary | ICD-10-CM | POA: Diagnosis not present

## 2022-01-16 ENCOUNTER — Other Ambulatory Visit: Payer: Self-pay | Admitting: Family Medicine

## 2022-01-16 DIAGNOSIS — Z720 Tobacco use: Secondary | ICD-10-CM

## 2022-01-16 DIAGNOSIS — F17200 Nicotine dependence, unspecified, uncomplicated: Secondary | ICD-10-CM

## 2022-01-19 ENCOUNTER — Other Ambulatory Visit: Payer: Self-pay | Admitting: Family Medicine

## 2022-01-19 DIAGNOSIS — E2839 Other primary ovarian failure: Secondary | ICD-10-CM

## 2022-01-19 DIAGNOSIS — Z1231 Encounter for screening mammogram for malignant neoplasm of breast: Secondary | ICD-10-CM

## 2022-02-13 ENCOUNTER — Other Ambulatory Visit: Payer: Self-pay

## 2022-02-13 ENCOUNTER — Ambulatory Visit
Admission: RE | Admit: 2022-02-13 | Discharge: 2022-02-13 | Disposition: A | Discharge status: Home / Self Care | Payer: PPO | Source: Ambulatory Visit | Attending: Family Medicine | Admitting: Family Medicine

## 2022-02-13 DIAGNOSIS — F17200 Nicotine dependence, unspecified, uncomplicated: Secondary | ICD-10-CM

## 2022-02-13 DIAGNOSIS — I2510 Atherosclerotic heart disease of native coronary artery without angina pectoris: Secondary | ICD-10-CM | POA: Diagnosis not present

## 2022-02-13 DIAGNOSIS — I700 Atherosclerosis of aorta: Secondary | ICD-10-CM | POA: Diagnosis not present

## 2022-02-13 DIAGNOSIS — Z87891 Personal history of nicotine dependence: Secondary | ICD-10-CM | POA: Diagnosis not present

## 2022-02-13 DIAGNOSIS — Z720 Tobacco use: Secondary | ICD-10-CM

## 2022-03-11 ENCOUNTER — Ambulatory Visit
Admission: RE | Admit: 2022-03-11 | Discharge: 2022-03-11 | Disposition: A | Discharge status: Home / Self Care | Payer: PPO | Source: Ambulatory Visit | Attending: Family Medicine | Admitting: Family Medicine

## 2022-03-11 DIAGNOSIS — Z1231 Encounter for screening mammogram for malignant neoplasm of breast: Secondary | ICD-10-CM

## 2022-03-11 DIAGNOSIS — M8589 Other specified disorders of bone density and structure, multiple sites: Secondary | ICD-10-CM | POA: Diagnosis not present

## 2022-03-11 DIAGNOSIS — E2839 Other primary ovarian failure: Secondary | ICD-10-CM

## 2022-03-11 DIAGNOSIS — Z780 Asymptomatic menopausal state: Secondary | ICD-10-CM | POA: Diagnosis not present

## 2022-03-12 DIAGNOSIS — N390 Urinary tract infection, site not specified: Secondary | ICD-10-CM | POA: Diagnosis not present

## 2022-03-12 DIAGNOSIS — R509 Fever, unspecified: Secondary | ICD-10-CM | POA: Diagnosis not present

## 2022-03-12 DIAGNOSIS — R531 Weakness: Secondary | ICD-10-CM | POA: Diagnosis not present

## 2022-03-18 DIAGNOSIS — H25813 Combined forms of age-related cataract, bilateral: Secondary | ICD-10-CM | POA: Diagnosis not present

## 2022-03-18 DIAGNOSIS — H02831 Dermatochalasis of right upper eyelid: Secondary | ICD-10-CM | POA: Diagnosis not present

## 2022-03-18 DIAGNOSIS — H52203 Unspecified astigmatism, bilateral: Secondary | ICD-10-CM | POA: Diagnosis not present

## 2022-03-18 DIAGNOSIS — H02834 Dermatochalasis of left upper eyelid: Secondary | ICD-10-CM | POA: Diagnosis not present

## 2022-03-18 DIAGNOSIS — H35363 Drusen (degenerative) of macula, bilateral: Secondary | ICD-10-CM | POA: Diagnosis not present

## 2022-03-23 DIAGNOSIS — H25813 Combined forms of age-related cataract, bilateral: Secondary | ICD-10-CM | POA: Diagnosis not present

## 2022-03-26 DIAGNOSIS — H52223 Regular astigmatism, bilateral: Secondary | ICD-10-CM | POA: Diagnosis not present

## 2022-03-26 DIAGNOSIS — H25813 Combined forms of age-related cataract, bilateral: Secondary | ICD-10-CM | POA: Diagnosis not present

## 2022-03-26 DIAGNOSIS — H35363 Drusen (degenerative) of macula, bilateral: Secondary | ICD-10-CM | POA: Diagnosis not present

## 2022-03-26 DIAGNOSIS — H25811 Combined forms of age-related cataract, right eye: Secondary | ICD-10-CM | POA: Diagnosis not present

## 2022-03-26 DIAGNOSIS — K219 Gastro-esophageal reflux disease without esophagitis: Secondary | ICD-10-CM | POA: Diagnosis not present

## 2022-03-26 DIAGNOSIS — E7800 Pure hypercholesterolemia, unspecified: Secondary | ICD-10-CM | POA: Diagnosis not present

## 2022-03-26 DIAGNOSIS — I2583 Coronary atherosclerosis due to lipid rich plaque: Secondary | ICD-10-CM | POA: Diagnosis not present

## 2022-03-26 DIAGNOSIS — H02831 Dermatochalasis of right upper eyelid: Secondary | ICD-10-CM | POA: Diagnosis not present

## 2022-03-26 DIAGNOSIS — H02835 Dermatochalasis of left lower eyelid: Secondary | ICD-10-CM | POA: Diagnosis not present

## 2022-03-26 DIAGNOSIS — H2181 Floppy iris syndrome: Secondary | ICD-10-CM | POA: Diagnosis not present

## 2022-03-26 DIAGNOSIS — M1990 Unspecified osteoarthritis, unspecified site: Secondary | ICD-10-CM | POA: Diagnosis not present

## 2022-03-26 DIAGNOSIS — Z96643 Presence of artificial hip joint, bilateral: Secondary | ICD-10-CM | POA: Diagnosis not present

## 2022-03-26 DIAGNOSIS — I2510 Atherosclerotic heart disease of native coronary artery without angina pectoris: Secondary | ICD-10-CM | POA: Diagnosis not present

## 2022-03-26 DIAGNOSIS — I10 Essential (primary) hypertension: Secondary | ICD-10-CM | POA: Diagnosis not present

## 2022-04-02 DIAGNOSIS — K219 Gastro-esophageal reflux disease without esophagitis: Secondary | ICD-10-CM | POA: Diagnosis not present

## 2022-04-02 DIAGNOSIS — H25812 Combined forms of age-related cataract, left eye: Secondary | ICD-10-CM | POA: Diagnosis not present

## 2022-04-02 DIAGNOSIS — Z79899 Other long term (current) drug therapy: Secondary | ICD-10-CM | POA: Diagnosis not present

## 2022-04-02 DIAGNOSIS — Z885 Allergy status to narcotic agent status: Secondary | ICD-10-CM | POA: Diagnosis not present

## 2022-04-02 DIAGNOSIS — E785 Hyperlipidemia, unspecified: Secondary | ICD-10-CM | POA: Diagnosis not present

## 2022-04-02 DIAGNOSIS — I10 Essential (primary) hypertension: Secondary | ICD-10-CM | POA: Diagnosis not present

## 2022-04-02 DIAGNOSIS — M1990 Unspecified osteoarthritis, unspecified site: Secondary | ICD-10-CM | POA: Diagnosis not present

## 2022-04-16 DIAGNOSIS — Z961 Presence of intraocular lens: Secondary | ICD-10-CM | POA: Diagnosis not present

## 2022-06-03 DIAGNOSIS — R82998 Other abnormal findings in urine: Secondary | ICD-10-CM | POA: Diagnosis not present

## 2022-06-03 DIAGNOSIS — R42 Dizziness and giddiness: Secondary | ICD-10-CM | POA: Diagnosis not present

## 2022-06-03 DIAGNOSIS — N760 Acute vaginitis: Secondary | ICD-10-CM | POA: Diagnosis not present

## 2022-06-03 DIAGNOSIS — N3001 Acute cystitis with hematuria: Secondary | ICD-10-CM | POA: Diagnosis not present

## 2022-11-06 DIAGNOSIS — N3020 Other chronic cystitis without hematuria: Secondary | ICD-10-CM | POA: Diagnosis not present

## 2023-02-09 DIAGNOSIS — E559 Vitamin D deficiency, unspecified: Secondary | ICD-10-CM | POA: Diagnosis not present

## 2023-02-09 DIAGNOSIS — I2510 Atherosclerotic heart disease of native coronary artery without angina pectoris: Secondary | ICD-10-CM | POA: Diagnosis not present

## 2023-02-09 DIAGNOSIS — I10 Essential (primary) hypertension: Secondary | ICD-10-CM | POA: Diagnosis not present

## 2023-02-09 DIAGNOSIS — R7303 Prediabetes: Secondary | ICD-10-CM | POA: Diagnosis not present

## 2023-02-09 DIAGNOSIS — Z6828 Body mass index (BMI) 28.0-28.9, adult: Secondary | ICD-10-CM | POA: Diagnosis not present

## 2023-02-09 DIAGNOSIS — K219 Gastro-esophageal reflux disease without esophagitis: Secondary | ICD-10-CM | POA: Diagnosis not present

## 2023-02-09 DIAGNOSIS — M8580 Other specified disorders of bone density and structure, unspecified site: Secondary | ICD-10-CM | POA: Diagnosis not present

## 2023-02-09 DIAGNOSIS — I700 Atherosclerosis of aorta: Secondary | ICD-10-CM | POA: Diagnosis not present

## 2023-02-09 DIAGNOSIS — Z1389 Encounter for screening for other disorder: Secondary | ICD-10-CM | POA: Diagnosis not present

## 2023-02-09 DIAGNOSIS — Z720 Tobacco use: Secondary | ICD-10-CM | POA: Diagnosis not present

## 2023-02-09 DIAGNOSIS — E7800 Pure hypercholesterolemia, unspecified: Secondary | ICD-10-CM | POA: Diagnosis not present

## 2023-02-09 DIAGNOSIS — Z0000 Encounter for general adult medical examination without abnormal findings: Secondary | ICD-10-CM | POA: Diagnosis not present

## 2023-02-10 ENCOUNTER — Other Ambulatory Visit: Payer: Self-pay | Admitting: Family Medicine

## 2023-02-10 DIAGNOSIS — Z1231 Encounter for screening mammogram for malignant neoplasm of breast: Secondary | ICD-10-CM

## 2023-03-17 DIAGNOSIS — L720 Epidermal cyst: Secondary | ICD-10-CM | POA: Diagnosis not present

## 2023-03-17 DIAGNOSIS — C44329 Squamous cell carcinoma of skin of other parts of face: Secondary | ICD-10-CM | POA: Diagnosis not present

## 2023-03-17 DIAGNOSIS — L821 Other seborrheic keratosis: Secondary | ICD-10-CM | POA: Diagnosis not present

## 2023-03-29 ENCOUNTER — Ambulatory Visit
Admission: RE | Admit: 2023-03-29 | Discharge: 2023-03-29 | Disposition: A | Discharge status: Home / Self Care | Payer: PPO | Source: Ambulatory Visit | Attending: Family Medicine | Admitting: Family Medicine

## 2023-03-29 DIAGNOSIS — Z1231 Encounter for screening mammogram for malignant neoplasm of breast: Secondary | ICD-10-CM

## 2023-03-30 DIAGNOSIS — N390 Urinary tract infection, site not specified: Secondary | ICD-10-CM | POA: Diagnosis not present

## 2023-03-30 DIAGNOSIS — R300 Dysuria: Secondary | ICD-10-CM | POA: Diagnosis not present

## 2023-03-30 DIAGNOSIS — R82998 Other abnormal findings in urine: Secondary | ICD-10-CM | POA: Diagnosis not present

## 2023-03-30 DIAGNOSIS — N760 Acute vaginitis: Secondary | ICD-10-CM | POA: Diagnosis not present

## 2023-05-17 DIAGNOSIS — D485 Neoplasm of uncertain behavior of skin: Secondary | ICD-10-CM | POA: Diagnosis not present

## 2023-05-17 DIAGNOSIS — L570 Actinic keratosis: Secondary | ICD-10-CM | POA: Diagnosis not present

## 2023-05-17 DIAGNOSIS — R233 Spontaneous ecchymoses: Secondary | ICD-10-CM | POA: Diagnosis not present

## 2023-06-25 DIAGNOSIS — R21 Rash and other nonspecific skin eruption: Secondary | ICD-10-CM | POA: Diagnosis not present

## 2023-06-25 DIAGNOSIS — T7840XA Allergy, unspecified, initial encounter: Secondary | ICD-10-CM | POA: Diagnosis not present

## 2023-07-06 DIAGNOSIS — L501 Idiopathic urticaria: Secondary | ICD-10-CM | POA: Diagnosis not present

## 2023-09-20 ENCOUNTER — Encounter: Payer: Self-pay | Admitting: Emergency Medicine

## 2023-09-20 ENCOUNTER — Ambulatory Visit
Admission: EM | Admit: 2023-09-20 | Discharge: 2023-09-20 | Disposition: A | Discharge status: Home / Self Care | Payer: PPO | Attending: Family Medicine | Admitting: Family Medicine

## 2023-09-20 DIAGNOSIS — N3001 Acute cystitis with hematuria: Secondary | ICD-10-CM

## 2023-09-20 DIAGNOSIS — B9689 Other specified bacterial agents as the cause of diseases classified elsewhere: Secondary | ICD-10-CM | POA: Diagnosis not present

## 2023-09-20 DIAGNOSIS — Z87440 Personal history of urinary (tract) infections: Secondary | ICD-10-CM | POA: Diagnosis not present

## 2023-09-20 DIAGNOSIS — E119 Type 2 diabetes mellitus without complications: Secondary | ICD-10-CM | POA: Diagnosis not present

## 2023-09-20 DIAGNOSIS — R300 Dysuria: Secondary | ICD-10-CM | POA: Diagnosis not present

## 2023-09-20 LAB — POCT URINALYSIS DIP (MANUAL ENTRY)
Glucose, UA: 100 mg/dL — AB
Nitrite, UA: POSITIVE — AB
Protein Ur, POC: 100 mg/dL — AB
Spec Grav, UA: >=1.03 — AB (ref 1.010–1.025)
Urobilinogen, UA: 2 U/dL — AB
pH, UA: 5 (ref 5.0–8.0)

## 2023-09-20 MED ORDER — CEFDINIR 300 MG PO CAPS: 300.0000 mg | ORAL_CAPSULE | Freq: Two times a day (BID) | ORAL | 0 refills | Status: AC

## 2023-09-20 NOTE — ED Triage Notes (Signed)
Patient c/o UTI symptoms x 2 weeks. Patient has been using OTC AZO with no relief.

## 2023-09-20 NOTE — Discharge Instructions (Addendum)
Advised patient to take medication as directed with food to completion.  Encouraged to increase daily water intake to 64 ounces per day while taking this medication.  Advised we will follow-up with urine culture results once received.  Advised if symptoms worsen and/or unresolved please follow-up with PCP or here for further evaluation.

## 2023-09-20 NOTE — ED Provider Notes (Signed)
[facility]    CSN: [id number] Arrival date & time: 09/20/23  4098      History   Chief Complaint No chief complaint on file.   HPI Jane [name] is a 72 y.o. female.   HPI Pleasant 72 year old female presents with dysuria for 2 weeks.  PMH significant for skin cancer, T2DM, and history of recurrent UTIs.  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   GERD (gastroesophageal reflux disease)    HYPERTENSION 01/30/2010   DIVERTICULOSIS-COLON 01/30/2010   DIVERTICULITIS OF COLON 01/27/2010   CHANGE IN BOWELS 01/21/2010   ABDOMINAL PAIN-LLQ 01/21/2010   ABDOMINAL PAIN, LOWER 01/21/2010   UTI'S, HX OF 01/21/2010   HYPERLIPIDEMIA 10/28/2007   GERD 10/28/2007   HIATAL HERNIA 10/28/2007   IRRITABLE BOWEL SYNDROME 10/28/2007   OSTEOARTHRITIS 10/28/2007   OSTEOARTHRITIS 10/28/2007   ARTHRITIS 10/28/2007   History of colonic polyps 10/28/2007    Past Surgical History:  Procedure Laterality Date   CESAREAN SECTION     TOTAL HIP ARTHROPLASTY     TOTAL HIP ARTHROPLASTY Left 07/17/2014   Procedure: LEFT TOTAL HIP ARTHROPLASTY ANTERIOR APPROACH;  Surgeon: Kathryne Hitch, MD;  Location[facility];  Service: Orthopedics;  Laterality: Left;    OB History   No obstetric history on file.      Home Medications    Prior to Admission medications   Medication Sig Start Date End Date Taking? Authorizing Provider  cefdinir (OMNICEF) 300 MG capsule Take 1 capsule (300 mg total) by mouth 2 (two) times daily for 7 days. 09/20/23 09/27/23 Yes Trevor Iha, FNP   Cholecalciferol (VITAMIN D PO) Take 1 tablet by mouth daily.    [provider]  omeprazole (PRILOSEC) 40 MG capsule Take 40 mg by mouth daily.    [provider]  phentermine (ADIPEX-P) 37.5 MG tablet Take 37.5 mg by mouth daily. 06/12/17   [provider]  phentermine 37.5 MG capsule Take 37.5 mg by mouth every morning.    [provider]  triamcinolone cream (KENALOG) 0.1 % Apply thin layer on rash 2 times daily For 5-7 days. 03/22/17   Lurene Shadow, PA-C    Family History Family History  Problem Relation Age of Onset   Breast cancer Paternal Grandmother    Brain cancer Paternal Uncle    Diabetes Sister    Anuerysm Paternal Aunt    COPD Brother    Heart disease Brother        MI x3   COPD Father    Cerebral aneurysm Mother    Heart disease Mother    Colon cancer Neg Hx    Pancreatic cancer Neg Hx    Stomach cancer Neg Hx     Social History Social History   Tobacco Use   Smoking status: Every Day    Current packs/day: 0.50    Average packs/day: 0.5 packs/day for 45.0 years (22.5 ttl pk-yrs)    Types:  Cigarettes   Smokeless tobacco: Never  Vaping Use   Vaping status: Never Used  Substance Use Topics   Alcohol use: Yes    Alcohol/week: 10.0 standard drinks of alcohol    Types: 10 Shots of liquor per week    Comment: 5 drinks per week   Drug use: No     Allergies   Codeine   Review of Systems Review of Systems  Genitourinary:  Positive for dysuria.  All other systems reviewed and are negative.    Physical Exam Triage Vital Signs ED Triage Vitals  Encounter Vitals Group     BP 09/20/23 0954 113/71     Systolic BP Percentile --      Diastolic BP Percentile --      Pulse Rate 09/20/23 0954 96     Resp 09/20/23 0954 15     Temp 09/20/23 0954 98.5 F (36.9 C)     Temp Source 09/20/23 0954 Oral     SpO2 09/20/23 0954 95 %     Weight --      Height --      Head Circumference --      Peak Flow --      Pain Score  09/20/23 0953 8     Pain Loc --      Pain Education --      Exclude from Growth Chart --    No data found.  Updated Vital Signs BP 113/71 (BP Location: Left Arm)   Pulse 96   Temp 98.5 F (36.9 C) (Oral)   Resp 15   SpO2 95%    Physical Exam Vitals and nursing note reviewed.  Constitutional:      Appearance: Normal appearance. She is normal weight.  HENT:     Head: Normocephalic and atraumatic.     Mouth/Throat:     Mouth: Mucous membranes are moist.     Pharynx: Oropharynx is clear.  Eyes:     Extraocular Movements: Extraocular movements intact.     Conjunctiva/sclera: Conjunctivae normal.     Pupils: Pupils are equal, round, and reactive to light.  Cardiovascular:     Rate and Rhythm: Normal rate and regular rhythm.     Pulses: Normal pulses.  Pulmonary:     Effort: Pulmonary effort is normal.     Breath sounds: Normal breath sounds. No wheezing, rhonchi or rales.  Abdominal:     Tenderness: There is no right CVA tenderness or left CVA tenderness.  Musculoskeletal:        General: Normal range of motion.     Cervical back: Normal range of motion and neck supple.  Skin:    General: Skin is warm and dry.  Neurological:     General: No focal deficit present.     Mental Status: She is alert and oriented to person, place, and time. Mental status is at baseline.  Psychiatric:        Mood and Affect: Mood normal.        Behavior: Behavior normal.        Thought Content: Thought content normal.      UC Treatments / Results  Labs (all labs ordered are listed, but only abnormal results are displayed) Labs Reviewed  POCT URINALYSIS DIP (MANUAL ENTRY) - Abnormal; Notable for the following components:      Result Value   Color, UA orange (*)    Glucose, UA =100 (*)    Bilirubin, UA small (*)    Ketones, POC UA  trace (5) (*)    Spec Grav, UA >=1.030 (*)    Blood, UA small (*)    Protein Ur, POC =100 (*)    Urobilinogen, UA 2.0 (*)    Nitrite, UA Positive (*)     Leukocytes, UA Moderate (2+) (*)    All other components within normal limits  URINE CULTURE    EKG   Radiology No results found.  Procedures Procedures (including critical care time)  Medications Ordered in UC Medications - No data to display  Initial Impression / Assessment and Plan / UC Course  I have reviewed the triage vital signs and the nursing notes.  Pertinent labs & imaging results that were available during my care of the patient were reviewed by me and considered in my medical decision making (see chart for details).     MDM: 1.  Acute cystitis with hematuria-Rx'd Cefdinir 300 mg capsule: Take 1 capsule twice daily x 7 days, UA revealed above, urine culture ordered; 2.  Dysuria-Rx'd Cefdinir 300 mg capsule: Take 1 capsule twice daily x 7 days, UA revealed above, urine culture ordered. Advised patient to take medication as directed with food to completion.  Encouraged to increase daily water intake to 64 ounces per day while taking this medication.  Advised we will follow-up with urine culture results once received.  Advised if symptoms worsen and/or unresolved please follow-up with PCP or here for further evaluation.  Patient discharged home, hemodynamically stable. Final Clinical Impressions(s) / UC Diagnoses   Final diagnoses:  Acute cystitis with hematuria  Dysuria     Discharge Instructions      Advised patient to take medication as directed with food to completion.  Encouraged to increase daily water intake to 64 ounces per day while taking this medication.  Advised we will follow-up with urine culture results once received.  Advised if symptoms worsen and/or unresolved please follow-up with PCP or here for further evaluation.     ED Prescriptions     Medication Sig Dispense Auth. Provider   cefdinir (OMNICEF) 300 MG capsule Take 1 capsule (300 mg total) by mouth 2 (two) times daily for 7 days. 14 capsule Trevor Iha, FNP      PDMP not reviewed this  encounter.   Trevor Iha, FNP 09/20/23 1047

## 2023-09-22 LAB — URINE CULTURE: Culture: >=100000 — AB

## 2023-10-18 ENCOUNTER — Encounter: Payer: Self-pay | Admitting: Emergency Medicine

## 2023-10-18 ENCOUNTER — Ambulatory Visit
Admission: EM | Admit: 2023-10-18 | Discharge: 2023-10-18 | Disposition: A | Discharge status: Home / Self Care | Payer: PPO | Attending: Family Medicine | Admitting: Family Medicine

## 2023-10-18 DIAGNOSIS — N399 Disorder of urinary system, unspecified: Secondary | ICD-10-CM

## 2023-10-18 DIAGNOSIS — N3001 Acute cystitis with hematuria: Secondary | ICD-10-CM | POA: Diagnosis not present

## 2023-10-18 LAB — POCT URINALYSIS DIP (MANUAL ENTRY)
Bilirubin, UA: NEGATIVE
Glucose, UA: NEGATIVE mg/dL
Ketones, POC UA: NEGATIVE mg/dL
Nitrite, UA: POSITIVE — AB
Protein Ur, POC: 30 mg/dL — AB
Spec Grav, UA: >=1.03 — AB (ref 1.010–1.025)
Urobilinogen, UA: 0.2 U/dL
pH, UA: 5.5 (ref 5.0–8.0)

## 2023-10-18 MED ORDER — CEFDINIR 300 MG PO CAPS: 300.0000 mg | ORAL_CAPSULE | Freq: Two times a day (BID) | ORAL | 0 refills | Status: AC

## 2023-10-18 MED ORDER — FLAVOXATE HCL 100 MG PO TABS
100.0000 mg | ORAL_TABLET | Freq: Three times a day (TID) | ORAL | 0 refills | Status: DC | PRN
Start: 2023-10-18 — End: 2024-03-09

## 2023-10-18 NOTE — ED Triage Notes (Signed)
Patient c/o UTI sx's.  Patient began having fever, chills, urinary spasms on Friday.  Patient denies any dysuria.  Patient has taken Tylenol.

## 2023-10-18 NOTE — ED Provider Notes (Signed)
[facility]    CSN: [id number] Arrival date & time: 10/18/23  3244      History   Chief Complaint Chief Complaint  Patient presents with   Possible UTI    HPI Jane [name] is a 72 y.o. female.   HPI Pleasant 72 year old female who presents with fever, chills and urinary spasms for 2 to 3 days.  Evaluated by me on 09/20/2023 for similar symptoms urine culture revealed E. coli which was covered by prescribed cefdinir at time of office visit.  PMH significant for history of urinary tract infections, IBS, and HTN.  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   GERD (gastroesophageal reflux disease)    HYPERTENSION 01/30/2010   DIVERTICULOSIS-COLON 01/30/2010   DIVERTICULITIS OF COLON 01/27/2010   CHANGE IN BOWELS 01/21/2010   ABDOMINAL PAIN-LLQ 01/21/2010   ABDOMINAL PAIN, LOWER 01/21/2010   UTI'S, HX OF 01/21/2010   HYPERLIPIDEMIA 10/28/2007   GERD 10/28/2007   HIATAL HERNIA 10/28/2007   IRRITABLE BOWEL SYNDROME 10/28/2007   OSTEOARTHRITIS 10/28/2007   OSTEOARTHRITIS 10/28/2007   ARTHRITIS 10/28/2007   History of colonic polyps 10/28/2007    Past Surgical History:  Procedure Laterality Date   CESAREAN SECTION     TOTAL HIP ARTHROPLASTY     TOTAL HIP ARTHROPLASTY Left 07/17/2014   Procedure: LEFT TOTAL HIP ARTHROPLASTY ANTERIOR APPROACH;  Surgeon: Kathryne Hitch, MD;  Location[facility];  Service: Orthopedics;  Laterality: Left;    OB History   No obstetric history on file.      Home Medications    Prior to Admission medications    Medication Sig Start Date End Date Taking? Authorizing Provider  atorvastatin (LIPITOR) 20 MG tablet Take 20 mg by mouth daily.   Yes [provider]  cefdinir (OMNICEF) 300 MG capsule Take 1 capsule (300 mg total) by mouth 2 (two) times daily for 7 days. 10/18/23 10/25/23 Yes Trevor Iha, FNP  Cholecalciferol (VITAMIN D PO) Take 1 tablet by mouth daily.   Yes [provider]  flavoxATE (URISPAS) 100 MG tablet Take 1 tablet (100 mg total) by mouth 3 (three) times daily as needed for bladder spasms. 10/18/23  Yes Trevor Iha, FNP  olmesartan (BENICAR) 20 MG tablet Take 20 mg by mouth daily.   Yes [provider]  omeprazole (PRILOSEC) 40 MG capsule Take 40 mg by mouth daily.   Yes [provider]  phentermine (ADIPEX-P) 37.5 MG tablet Take 37.5 mg by mouth daily. 06/12/17   [provider]  phentermine 37.5 MG capsule Take 37.5 mg by mouth every morning.    [provider]  triamcinolone cream (KENALOG) 0.1 % Apply thin layer on rash 2 times daily For 5-7 days. 03/22/17   Lurene Shadow, PA-C    Family History Family History  Problem Relation Age of Onset   Breast cancer Paternal Grandmother    Brain cancer Paternal Uncle    Diabetes Sister  Anuerysm Paternal Aunt    COPD Brother    Heart disease Brother        MI x3   COPD Father    Cerebral aneurysm Mother    Heart disease Mother    Colon cancer Neg Hx    Pancreatic cancer Neg Hx    Stomach cancer Neg Hx     Social History Social History   Tobacco Use   Smoking status: Every Day    Current packs/day: 0.50    Average packs/day: 0.5 packs/day for 45.0 years (22.5 ttl pk-yrs)    Types: Cigarettes   Smokeless tobacco: Never  Vaping Use   Vaping status: Never Used  Substance Use Topics   Alcohol use: Yes    Alcohol/week: 10.0 standard drinks of alcohol    Types: 10 Shots of liquor per week    Comment: 5 drinks per week   Drug use: No     Allergies    Codeine   Review of Systems Review of Systems  Constitutional:  Positive for chills and fever.  Genitourinary:        Urinary spasms for 2 to 3 days  All other systems reviewed and are negative.    Physical Exam Triage Vital Signs ED Triage Vitals  Encounter Vitals Group     BP 10/18/23 0847 106/70     Systolic BP Percentile --      Diastolic BP Percentile --      Pulse Rate 10/18/23 0847 79     Resp 10/18/23 0847 16     Temp 10/18/23 0847 98.6 F (37 C)     Temp Source 10/18/23 0847 Oral     SpO2 10/18/23 0847 97 %     Weight 10/18/23 0849 145 lb (65.8 kg)     Height 10/18/23 0849 5\' 1"  (1.549 m)     Head Circumference --      Peak Flow --      Pain Score 10/18/23 0849 7     Pain Loc --      Pain Education --      Exclude from Growth Chart --    No data found.  Updated Vital Signs BP 106/70 (BP Location: Right Arm)   Pulse 79   Temp 98.6 F (37 C) (Oral)   Resp 16   Ht 5\' 1"  (1.549 m)   Wt 145 lb (65.8 kg)   SpO2 97%   BMI 27.40 kg/m    Physical Exam Vitals and nursing note reviewed.  Constitutional:      Appearance: Normal appearance. She is normal weight.  HENT:     Head: Normocephalic and atraumatic.     Right Ear: Tympanic membrane, ear canal and external ear normal.     Left Ear: Tympanic membrane, ear canal and external ear normal.     Mouth/Throat:     Mouth: Mucous membranes are moist.     Pharynx: Oropharynx is clear.  Eyes:     Extraocular Movements: Extraocular movements intact.     Conjunctiva/sclera: Conjunctivae normal.     Pupils: Pupils are equal, round, and reactive to light.  Cardiovascular:     Rate and Rhythm: Normal rate and regular rhythm.     Pulses: Normal pulses.     Heart sounds: Normal heart sounds.  Pulmonary:     Effort: Pulmonary effort is normal.     Breath sounds: Normal breath sounds. No wheezing, rhonchi or rales.  Abdominal:     Tenderness: There is no  right CVA tenderness or left CVA tenderness.   Musculoskeletal:        General: Normal range of motion.     Cervical back: Normal range of motion and neck supple.  Skin:    General: Skin is warm and dry.  Neurological:     General: No focal deficit present.     Mental Status: She is alert and oriented to person, place, and time. Mental status is at baseline.  Psychiatric:        Mood and Affect: Mood normal.        Behavior: Behavior normal.      UC Treatments / Results  Labs (all labs ordered are listed, but only abnormal results are displayed) Labs Reviewed  POCT URINALYSIS DIP (MANUAL ENTRY) - Abnormal; Notable for the following components:      Result Value   Clarity, UA cloudy (*)    Spec Grav, UA >=1.030 (*)    Blood, UA moderate (*)    Protein Ur, POC =30 (*)    Nitrite, UA Positive (*)    Leukocytes, UA Large (3+) (*)    All other components within normal limits  URINE CULTURE    EKG   Radiology No results found.  Procedures Procedures (including critical care time)  Medications Ordered in UC Medications - No data to display  Initial Impression / Assessment and Plan / UC Course  I have reviewed the triage vital signs and the nursing notes.  Pertinent labs & imaging results that were available during my care of the patient were reviewed by me and considered in my medical decision making (see chart for details).     MDM: 1.  Acute Cystitis with hematuria-cefdinir 300 mg capsule: Take 1 capsule twice daily x 7 days; 2.  Urinary tract disorder-specifically urinary spasms per patient-Rx'd Urispas 1 g capsule: Take 1 capsule 3 times daily for urinary spasms. Patient to take medication as directed with food to completion.  Advised may use Urispas daily or as needed for accompanying urinary spasms.  Encouraged increase daily water intake to 64 ounces per day while taking these medications.  Advised if symptoms worsen and/or unresolved please follow-up with [facility]rology or here for further evaluation.   Patient discharged home, hemodynamically stable. Final Clinical Impressions(s) / UC Diagnoses   Final diagnoses:  Acute cystitis with hematuria  Urinary tract disorder     Discharge Instructions      Patient to take medication as directed with food to completion.  Advised may use Urispas daily or as needed for accompanying urinary spasms.  Encouraged increase daily water intake to 64 ounces per day while taking these medications.  Advised if symptoms worsen and/or unresolved please follow-up with [facility] or here for further evaluation.     ED Prescriptions     Medication Sig Dispense Auth. Provider   cefdinir (OMNICEF) 300 MG capsule Take 1 capsule (300 mg total) by mouth 2 (two) times daily for 7 days. 14 capsule Trevor Iha, FNP   flavoxATE (URISPAS) 100 MG tablet Take 1 tablet (100 mg total) by mouth 3 (three) times daily as needed for bladder spasms. 30 tablet Trevor Iha, FNP      PDMP not reviewed this encounter.   Trevor Iha, FNP 10/18/23 1008

## 2023-10-18 NOTE — Discharge Instructions (Addendum)
Patient to take medication as directed with food to completion.  Advised may use Urispas daily or as needed for accompanying urinary spasms.  Encouraged increase daily water intake to 64 ounces per day while taking these medications.  Advised if symptoms worsen and/or unresolved please follow-up with [facility]rology or here for further evaluation.

## 2023-10-20 LAB — URINE CULTURE: Culture: >=100000 — AB

## 2023-11-10 DIAGNOSIS — N3020 Other chronic cystitis without hematuria: Secondary | ICD-10-CM | POA: Diagnosis not present

## 2024-02-17 DIAGNOSIS — Z6826 Body mass index (BMI) 26.0-26.9, adult: Secondary | ICD-10-CM | POA: Diagnosis not present

## 2024-02-17 DIAGNOSIS — R0789 Other chest pain: Secondary | ICD-10-CM | POA: Diagnosis not present

## 2024-02-17 DIAGNOSIS — R29898 Other symptoms and signs involving the musculoskeletal system: Secondary | ICD-10-CM | POA: Diagnosis not present

## 2024-02-21 DIAGNOSIS — Z6826 Body mass index (BMI) 26.0-26.9, adult: Secondary | ICD-10-CM | POA: Diagnosis not present

## 2024-02-21 DIAGNOSIS — Z1331 Encounter for screening for depression: Secondary | ICD-10-CM | POA: Diagnosis not present

## 2024-02-21 DIAGNOSIS — Z0000 Encounter for general adult medical examination without abnormal findings: Secondary | ICD-10-CM | POA: Diagnosis not present

## 2024-02-23 ENCOUNTER — Encounter: Payer: Self-pay | Admitting: Internal Medicine

## 2024-02-25 ENCOUNTER — Other Ambulatory Visit: Payer: Self-pay | Admitting: Family Medicine

## 2024-02-25 DIAGNOSIS — Z1231 Encounter for screening mammogram for malignant neoplasm of breast: Secondary | ICD-10-CM

## 2024-02-25 DIAGNOSIS — M8580 Other specified disorders of bone density and structure, unspecified site: Secondary | ICD-10-CM

## 2024-03-01 ENCOUNTER — Ambulatory Visit (AMBULATORY_SURGERY_CENTER)

## 2024-03-01 VITALS — Ht 61.0 in | Wt 144.0 lb

## 2024-03-01 DIAGNOSIS — Z1211 Encounter for screening for malignant neoplasm of colon: Secondary | ICD-10-CM

## 2024-03-01 MED ORDER — NA SULFATE-K SULFATE-MG SULF 17.5-3.13-1.6 GM/177ML PO SOLN: 1.0000 | Freq: Once | ORAL | 0 refills | Status: AC

## 2024-03-01 NOTE — Progress Notes (Signed)
 No egg or soy allergy known to patient  No issues known to pt with past sedation with any surgeries or procedures Patient denies ever being told they had issues or difficulty with intubation  No FH of Malignant Hyperthermia Pt is not on diet pills Pt is not on  home 02  Pt is not on blood thinners  Pt has issues with constipation intermittently, instructed to take extra Miralax.  No A fib or A flutter Have any cardiac testing pending--No Pt can ambulate  Pt denies use of chewing tobacco Discussed diabetic I weight loss medication holds Discussed NSAID holds Checked BMI Pt instructed to use [employer] or GoodRx for a price reduction on prep    Pre visit completed

## 2024-03-02 DIAGNOSIS — Z720 Tobacco use: Secondary | ICD-10-CM | POA: Diagnosis not present

## 2024-03-02 DIAGNOSIS — R7303 Prediabetes: Secondary | ICD-10-CM | POA: Diagnosis not present

## 2024-03-02 DIAGNOSIS — E7800 Pure hypercholesterolemia, unspecified: Secondary | ICD-10-CM | POA: Diagnosis not present

## 2024-03-02 DIAGNOSIS — I10 Essential (primary) hypertension: Secondary | ICD-10-CM | POA: Diagnosis not present

## 2024-03-02 DIAGNOSIS — E559 Vitamin D deficiency, unspecified: Secondary | ICD-10-CM | POA: Diagnosis not present

## 2024-03-02 DIAGNOSIS — I2510 Atherosclerotic heart disease of native coronary artery without angina pectoris: Secondary | ICD-10-CM | POA: Diagnosis not present

## 2024-03-02 DIAGNOSIS — Z6826 Body mass index (BMI) 26.0-26.9, adult: Secondary | ICD-10-CM | POA: Diagnosis not present

## 2024-03-02 DIAGNOSIS — I700 Atherosclerosis of aorta: Secondary | ICD-10-CM | POA: Diagnosis not present

## 2024-03-02 DIAGNOSIS — K219 Gastro-esophageal reflux disease without esophagitis: Secondary | ICD-10-CM | POA: Diagnosis not present

## 2024-03-02 DIAGNOSIS — M8580 Other specified disorders of bone density and structure, unspecified site: Secondary | ICD-10-CM | POA: Diagnosis not present

## 2024-03-07 ENCOUNTER — Encounter: Payer: Self-pay | Admitting: Internal Medicine

## 2024-03-09 ENCOUNTER — Encounter: Payer: Self-pay | Admitting: Internal Medicine

## 2024-03-09 ENCOUNTER — Ambulatory Visit (AMBULATORY_SURGERY_CENTER): Admitting: Internal Medicine

## 2024-03-09 VITALS — BP 154/76 | HR 67 | Temp 97.3°F | Resp 19 | Ht 61.0 in | Wt 144.0 lb

## 2024-03-09 DIAGNOSIS — D122 Benign neoplasm of ascending colon: Secondary | ICD-10-CM

## 2024-03-09 DIAGNOSIS — D123 Benign neoplasm of transverse colon: Secondary | ICD-10-CM

## 2024-03-09 DIAGNOSIS — K5730 Diverticulosis of large intestine without perforation or abscess without bleeding: Secondary | ICD-10-CM | POA: Diagnosis not present

## 2024-03-09 DIAGNOSIS — K648 Other hemorrhoids: Secondary | ICD-10-CM | POA: Diagnosis not present

## 2024-03-09 DIAGNOSIS — I10 Essential (primary) hypertension: Secondary | ICD-10-CM | POA: Diagnosis not present

## 2024-03-09 DIAGNOSIS — Z1211 Encounter for screening for malignant neoplasm of colon: Secondary | ICD-10-CM

## 2024-03-09 MED ORDER — SODIUM CHLORIDE 0.9 % IV SOLN: 500.0000 mL | INTRAVENOUS | Status: DC

## 2024-03-09 NOTE — Patient Instructions (Signed)
 Resume all of your previous medications today as ordered.  Read your discharge instructions.  YOU HAD AN ENDOSCOPIC PROCEDURE TODAY AT THE [facility]:   Refer to the procedure report that was given to you for any specific questions about what was found during the examination.  If the procedure report does not answer your questions, please call your gastroenterologist to clarify.  If you requested that your care partner not be given the details of your procedure findings, then the procedure report has been included in a sealed envelope for you to review at your convenience later.  YOU SHOULD EXPECT: Some feelings of bloating in the abdomen. Passage of more gas than usual.  Walking can help get rid of the air that was put into your GI tract during the procedure and reduce the bloating. If you had a lower endoscopy (such as a colonoscopy or flexible sigmoidoscopy) you may notice spotting of blood in your stool or on the toilet paper. If you underwent a bowel prep for your procedure, you may not have a normal bowel movement for a few days.  Please Note:  You might notice some irritation and congestion in your nose or some drainage.  This is from the oxygen used during your procedure.  There is no need for concern and it should clear up in a day or so.  SYMPTOMS TO REPORT IMMEDIATELY:  Following lower endoscopy (colonoscopy or flexible sigmoidoscopy):  Excessive amounts of blood in the stool  Significant tenderness or worsening of abdominal pains  Swelling of the abdomen that is new, acute  Fever of 100F or higher  For urgent or emergent issues, a gastroenterologist can be reached at any hour by calling [phone]. Do not use MyChart messaging for urgent concerns.    DIET:  We do recommend a small meal at first, but then you may proceed to your regular diet.  Drink plenty of fluids but you should avoid alcoholic beverages for 24 hours.  ACTIVITY:  You should plan to take it easy  for the rest of today and you should NOT DRIVE or use heavy machinery until tomorrow (because of the sedation medicines used during the test).    FOLLOW UP: Our staff will call the number listed on your records the next business day following your procedure.  We will call around 7:15- 8:00 am to check on you and address any questions or concerns that you may have regarding the information given to you following your procedure. If we do not reach you, we will leave a message.     If any biopsies were taken you will be contacted by phone or by letter within the next 1-3 weeks.  Please call us at [phone] if you have not heard about the biopsies in 3 weeks.    SIGNATURES/CONFIDENTIALITY: You and/or your care partner have signed paperwork which will be entered into your electronic medical record.  These signatures attest to the fact that that the information above on your After Visit Summary has been reviewed and is understood.  Full responsibility of the confidentiality of this discharge information lies with you and/or your care-partner.

## 2024-03-09 NOTE — Progress Notes (Signed)
 Called to room to assist during endoscopic procedure.  Patient ID and intended procedure confirmed with present staff. Received instructions for my participation in the procedure from the performing physician.

## 2024-03-09 NOTE — Progress Notes (Signed)
 HISTORY OF PRESENT ILLNESS:  Jane [name] is a 73 y.o. female presents today for screening colonoscopy.  Prior examinations 2005 and 2015 were negative for neoplasia  REVIEW OF SYSTEMS:  All non-GI ROS negative except for  Past Medical History:  Diagnosis Date   Arthritis    Cancer (HCC)    skin nose   GERD (gastroesophageal reflux disease)    Heart murmur    when pregnant   Hepatitis    75  from food   History of recurrent UTIs    on  prophalatic med   Hypertension    Pneumonia    hx    Past Surgical History:  Procedure Laterality Date   CESAREAN SECTION     COLON SURGERY     resection for diverticulitis   TOTAL HIP ARTHROPLASTY     TOTAL HIP ARTHROPLASTY Left 07/17/2014   Procedure: LEFT TOTAL HIP ARTHROPLASTY ANTERIOR APPROACH;  Surgeon: Arnie Lao, MD;  Location: [facility] OR;  Service: Orthopedics;  Laterality: Left;    Social History Jane [name]  reports that she has been smoking cigarettes. She has a 22.5 pack-year smoking history. She has never used smokeless tobacco. She reports current alcohol use of about 10.0 standard drinks of alcohol per week. She reports that she does not use drugs.  family history includes Anuerysm in her paternal aunt; Brain cancer in her paternal uncle; Breast cancer in her paternal grandmother; COPD in her brother and father; Cerebral aneurysm in her mother; Diabetes in her sister; Heart disease in her brother and mother.  Allergies  Allergen Reactions   Codeine Other (See Comments)    Rapid heartbeat, shortness of breath       PHYSICAL EXAMINATION: Vital signs: BP (!) 158/74   Pulse 75   Temp (!) 97.3 F (36.3 C) (Temporal)   Ht 5\' 1"  (1.549 m)   Wt 144 lb (65.3 kg)   SpO2 98%   BMI 27.21 kg/m  General: Well-developed, well-nourished, no acute distress HEENT: Sclerae are anicteric, conjunctiva pink. Oral mucosa intact Lungs: Clear Heart: Regular Abdomen: soft, nontender, nondistended, no obvious ascites, no  peritoneal signs, normal bowel sounds. No organomegaly. Extremities: No edema Psychiatric: alert and oriented x3. Cooperative     ASSESSMENT:  Colon cancer screening   PLAN:   Screening colonoscopy

## 2024-03-09 NOTE — Progress Notes (Signed)
 Pt's states no medical or surgical changes since previsit or office visit.

## 2024-03-09 NOTE — Op Note (Signed)
  [facility] Patient Name: Jane [name] Procedure Date: 03/09/2024 10:13 AM MRN: [id number] Endoscopist: Wilhemina Bonito. Marina Goodell , MD, [id number] Age: 73 Referring MD:  Date of Birth: 06-01-1951 Gender: Female Account #: [id number] Procedure:                Colonoscopy with cold snare polypectomy x 3 Indications:              Screening for colorectal malignant neoplasm.                            Previous examinations 2005, 2015 were negative for                            neoplasia Medicines:                Monitored Anesthesia Care Procedure:                Pre-Anesthesia Assessment:                           - Prior to the procedure, a History and Physical                            was performed, and patient medications and                            allergies were reviewed. The patient's tolerance of                            previous anesthesia was also reviewed. The risks                            and benefits of the procedure and the sedation                            options and risks were discussed with the patient.                            All questions were answered, and informed consent                            was obtained. Prior Anticoagulants: The patient has                            taken no anticoagulant or antiplatelet agents. ASA                            Grade Assessment: II - A patient with mild systemic                            disease. After reviewing the risks and benefits,                            the patient was deemed in satisfactory condition to  undergo the procedure.                           After obtaining informed consent, the colonoscope                            was passed under direct vision. Throughout the                            procedure, the patient's blood pressure, pulse, and                            oxygen saturations were monitored continuously. The                            CF [id number] #[id number]  was introduced through the anus                            and advanced to the the cecum, identified by                            appendiceal orifice and ileocecal valve. The                            ileocecal valve, appendiceal orifice, and rectum                            were photographed. The quality of the bowel                            preparation was excellent. The colonoscopy was                            performed without difficulty. The patient tolerated                            the procedure well. The bowel preparation used was                            SUPREP via split dose instruction. Scope In: 10:25:12 AM Scope Out: 10:37:13 AM Scope Withdrawal Time: 0 hours 10 minutes 16 seconds  Total Procedure Duration: 0 hours 12 minutes 1 second  Findings:                 Three polyps were found in the transverse colon and                            ascending colon. The polyps were 3 to 5 mm in size.                            These polyps were removed with a cold snare.                            Resection and retrieval were complete.  Multiple diverticula were found in the sigmoid                            colon and ascending colon.                           Internal hemorrhoids were found during retroflexion.                           The exam was otherwise without abnormality on                            direct and retroflexion views. Complications:            No immediate complications. Estimated blood loss:                            None. Estimated Blood Loss:     Estimated blood loss: none. Impression:               - Three 3 to 5 mm polyps in the transverse colon                            and in the ascending colon, removed with a cold                            snare. Resected and retrieved.                           - Diverticulosis in the sigmoid colon and in the                            ascending colon.                           -  Internal hemorrhoids.                           - The examination was otherwise normal on direct                            and retroflexion views. Recommendation:           - Repeat colonoscopy in 5 years for surveillance.                           - Patient has a contact number available for                            emergencies. The signs and symptoms of potential                            delayed complications were discussed with the                            patient. Return to normal activities tomorrow.  Written discharge instructions were provided to the                            patient.                           - Resume previous diet.                           - Continue present medications.                           - Await pathology results. Murel Arlington. Elvin Hammer, MD 03/09/2024 10:41:23 AM This report has been signed electronically.

## 2024-03-09 NOTE — Progress Notes (Signed)
 Sedate, gd SR, tolerated procedure well, VSS, report to RN

## 2024-03-13 ENCOUNTER — Telehealth: Payer: Self-pay

## 2024-03-13 NOTE — Telephone Encounter (Signed)
Attempted to reach patient for post-procedure f/u call. No answer. Left message for her to please not hesitate to call if she has any questions/concerns regarding her care. 

## 2024-03-14 ENCOUNTER — Encounter: Payer: Self-pay | Admitting: Internal Medicine

## 2024-03-14 LAB — SURGICAL PATHOLOGY

## 2024-03-30 ENCOUNTER — Ambulatory Visit
Admission: RE | Admit: 2024-03-30 | Discharge: 2024-03-30 | Disposition: A | Discharge status: Home / Self Care | Source: Ambulatory Visit | Attending: Family Medicine | Admitting: Family Medicine

## 2024-03-30 DIAGNOSIS — Z1231 Encounter for screening mammogram for malignant neoplasm of breast: Secondary | ICD-10-CM

## 2024-06-07 DIAGNOSIS — I2510 Atherosclerotic heart disease of native coronary artery without angina pectoris: Secondary | ICD-10-CM | POA: Diagnosis not present

## 2024-06-07 DIAGNOSIS — Z6826 Body mass index (BMI) 26.0-26.9, adult: Secondary | ICD-10-CM | POA: Diagnosis not present

## 2024-06-07 DIAGNOSIS — F419 Anxiety disorder, unspecified: Secondary | ICD-10-CM | POA: Diagnosis not present

## 2024-06-07 DIAGNOSIS — R2689 Other abnormalities of gait and mobility: Secondary | ICD-10-CM | POA: Diagnosis not present

## 2024-06-07 DIAGNOSIS — M7989 Other specified soft tissue disorders: Secondary | ICD-10-CM | POA: Diagnosis not present

## 2024-06-07 DIAGNOSIS — E559 Vitamin D deficiency, unspecified: Secondary | ICD-10-CM | POA: Diagnosis not present

## 2024-06-07 DIAGNOSIS — E7800 Pure hypercholesterolemia, unspecified: Secondary | ICD-10-CM | POA: Diagnosis not present

## 2024-06-07 DIAGNOSIS — Z720 Tobacco use: Secondary | ICD-10-CM | POA: Diagnosis not present

## 2024-06-07 DIAGNOSIS — I10 Essential (primary) hypertension: Secondary | ICD-10-CM | POA: Diagnosis not present

## 2024-06-07 DIAGNOSIS — R7303 Prediabetes: Secondary | ICD-10-CM | POA: Diagnosis not present

## 2024-06-07 DIAGNOSIS — I700 Atherosclerosis of aorta: Secondary | ICD-10-CM | POA: Diagnosis not present

## 2024-06-15 ENCOUNTER — Ambulatory Visit: Attending: Family Medicine

## 2024-06-15 ENCOUNTER — Other Ambulatory Visit: Payer: Self-pay

## 2024-06-15 DIAGNOSIS — Z9181 History of falling: Secondary | ICD-10-CM | POA: Diagnosis not present

## 2024-06-15 DIAGNOSIS — R2689 Other abnormalities of gait and mobility: Secondary | ICD-10-CM | POA: Diagnosis not present

## 2024-06-15 DIAGNOSIS — M6281 Muscle weakness (generalized): Secondary | ICD-10-CM | POA: Insufficient documentation

## 2024-06-15 NOTE — Therapy (Signed)
 OUTPATIENT PHYSICAL THERAPY LOWER EXTREMITY EVALUATION   Patient Name: Jane [name] MRN: [id number] DOB:1951-10-03, 72 y.o., female Today's Date: 06/15/2024  END OF SESSION:  PT End of Session - 06/15/24 0845     Visit Number 1    Number of Visits 17    Date for PT Re-Evaluation 08/12/24    Authorization Type [employer]    PT Start Time 0845    PT Stop Time 0930    PT Time Calculation (min) 45 min    Activity Tolerance Patient tolerated treatment well    Behavior During Therapy WFL for tasks assessed/performed          Past Medical History:  Diagnosis Date   Arthritis    Cancer (HCC)    skin nose   GERD (gastroesophageal reflux disease)    Heart murmur    when pregnant   Hepatitis    75  from food   History of recurrent UTIs    on  prophalatic med   Hypertension    Pneumonia    hx   Past Surgical History:  Procedure Laterality Date   CESAREAN SECTION     COLON SURGERY     resection for diverticulitis   TOTAL HIP ARTHROPLASTY     TOTAL HIP ARTHROPLASTY Left 07/17/2014   Procedure: LEFT TOTAL HIP ARTHROPLASTY ANTERIOR APPROACH;  Surgeon: Lonni CINDERELLA Poli, MD;  Location: [facility] OR;  Service: Orthopedics;  Laterality: Left;   Patient Active Problem List   Diagnosis Date Noted   Arthritis of left hip 07/17/2014   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/17/2014   GERD (gastroesophageal reflux disease)    HYPERTENSION 01/30/2010   DIVERTICULOSIS-COLON 01/30/2010   DIVERTICULITIS OF COLON 01/27/2010   CHANGE IN BOWELS 01/21/2010   ABDOMINAL PAIN-LLQ 01/21/2010   ABDOMINAL PAIN, LOWER 01/21/2010   UTI'S, HX OF 01/21/2010   HYPERLIPIDEMIA 10/28/2007   GERD 10/28/2007   HIATAL HERNIA 10/28/2007   IRRITABLE BOWEL SYNDROME 10/28/2007   OSTEOARTHRITIS 10/28/2007   OSTEOARTHRITIS 10/28/2007   ARTHRITIS 10/28/2007   History of colonic polyps 10/28/2007    PCP: Morgan Drivers, MD  REFERRING PROVIDER: Katina Pfeiffer, PA-C   REFERRING DIAG:  R26.89 (ICD-10-CM) - Other abnormalities of gait and mobility   THERAPY DIAG:  Muscle weakness (generalized)  Other abnormalities of gait and mobility  History of falling  Rationale for Evaluation and Treatment: Rehabilitation  ONSET DATE: July 2024   SUBJECTIVE:   SUBJECTIVE STATEMENT: Patient reports without shoes on her ankles are very weak. She reports she shuffles when she walks because her legs are weak. If she walks for a short distance her legs will start shaking because they are weak. She reports this has been ongoing for at least a year and has stayed about the same. She tried doing a few exercises at home for 3 months, but was not very consistent with this. Believes now that she needs PT to help with her strength and balance. She had 1 fall a couple months ago when walking in the living room due to LOB. She denies any dizziness/tripping/light-headedness related to this fall. She denies any dizziness currently. She does report low back pain with prolonged standing and walking, but is quickly relieved when she sits down. She does feel as though the leg weakness and back pain came about around the same time. She does report fear of falling.   PERTINENT HISTORY: THA- bilateral  PAIN:  Are you having pain? No  PRECAUTIONS: Fall  RED  FLAGS: None   WEIGHT BEARING RESTRICTIONS: No  FALLS:  Has patient fallen in last 6 months? Yes. Number of falls 1, patient reports she had LOB and fell in the living room  LIVING ENVIRONMENT: Lives with: lives with their spouse Lives in: House/apartment Stairs: No Has following equipment at home: Single point cane and Environmental consultant - 4 wheeled  OCCUPATION: retired  PLOF: Independent  PATIENT GOALS: I want to be able to have more strength in my legs and walk normal.  NEXT MD VISIT: September 2025   OBJECTIVE:  Note: Objective measures were completed at Evaluation unless otherwise noted.  PATIENT SURVEYS:  ABC scale: The  Activities-Specific Balance Confidence (ABC) Scale 0% 10 20 30  40 50 60 70 80 90 100% No confidence<->completely confident  "How confident are you that you will not lose your balance or become unsteady when you . . .   Date tested 06/15/24  Walk around the house 100%  2. Walk up or down stairs 0%  3. Bend over and pick up a slipper from in front of a closet floor 10%  4. Reach for a small can off a shelf at eye level 100%  5. Stand on tip toes and reach for something above your head 10%  6. Stand on a chair and reach for something 0%  7. Sweep the floor 50%  8. Walk outside the house to a car parked in the driveway 100%  9. Get into or out of a car 100%  10. Walk across a parking lot to the mall 20%  11. Walk up or down a ramp 20%  12. Walk in a crowded mall where people rapidly walk past you 100%  13. Are bumped into by people as you walk through the mall 0%  14. Step onto or off of an escalator while you are holding onto the railing 0%  15. Step onto or off an escalator while holding onto parcels such that you cannot hold onto the railing 0%  16. Walk outside on icy sidewalks 0%  Total: #/16  38 %     COGNITION: Overall cognitive status: Within functional limits for tasks assessed     SENSATION: Not tested    POSTURE: rounded shoulders and forward head   LOWER EXTREMITY MMT:  MMT Right eval Left eval  Hip flexion 3+ 3+  Hip extension 3- 3-  Hip abduction 3+ 3+  Hip adduction    Hip internal rotation    Hip external rotation    Knee flexion 5 5  Knee extension 4- 4-  Ankle dorsiflexion 5 5  Ankle plantarflexion 5 seated 5 seated  Ankle inversion 5 5  Ankle eversion 5 5   (Blank rows = not tested)   FUNCTIONAL TESTS:  TUG: 15 seconds    BERG BALANCE TEST Sitting to Standing: 3.      Stands independently using hands Standing Unsupported: 4.      Stands safely for 2 minutes Sitting Unsupported: 4.     Sits for 2 minutes independently Standing to Sitting:  4.     Sits safely with minimal use of hands Transfers: 3.     Transfers safely definite use of hands Standing with eyes closed: 3.     Stands 10 seconds with supervision Standing with feet together: 3.     Stands for 1 minute with supervision Reaching forward with outstretched arm: 3.     Reaches forward 5 inches Retrieving object from the floor: 2.  Unable to pick up, but reaches within 1-2 inches independently Turning to look behind: 4.     Looks behind from both sides and weight shifts well Turning 360 degrees: 2.     Able to turn slowly, but safely Place alternate foot on stool: 1.     Completes >2 steps with minimal assist Standing with one foot in front: 2.     Independent small step for 30 seconds Standing on one foot: 1.     Holds <3 seconds  Total Score: 36/56  GAIT: Distance walked: 20 ft  Assistive device utilized: None Level of assistance: Complete Independence Comments: limited foot clearance, small step length (not a true shuffling pattern, but more like a small march)                                                                                                                                [facility] Adult PT Treatment:                                                DATE: 06/15/24 Therapeutic Exercise: Demonstrated,performed, and issued initial HEP.      PATIENT EDUCATION:  Education details: see treatment; POC  Person educated: Patient Education method: Explanation, Demonstration, Tactile cues, Verbal cues, and Handouts Education comprehension: verbalized understanding, returned demonstration, verbal cues required, tactile cues required, and needs further education  HOME EXERCISE PROGRAM: Access Code: [email or web address] URL: [email or web address] Date: 06/15/2024 Prepared by: Lucie Meeter  Exercises - Romberg Stance  - 2 x daily - 7 x weekly - 3 sets - 30 sec  hold - Seated Long Arc Quad  - 2 x daily - 7 x weekly - 2 sets - 10 reps - Seated March with  Resistance  - 2 x daily - 7 x weekly - 2 sets - 10 reps - Seated Hip Abduction with Resistance  - 2 x daily - 7 x weekly - 2 sets - 10 reps  ASSESSMENT:  CLINICAL IMPRESSION: Patient is a 73 y.o. female who was seen today for physical therapy evaluation and treatment for other abnormalities of gait and mobility. Patient reports at least 1 year history of gait abnormalities and lower extremity weakness. She does note onset of low back pain around the same time she began to notice leg weakness and gait abnormalities. Upon assessment she is noted to have significant bilateral hip weakness and mild quadriceps weakness. She has gait abnormalities with limited foot clearance and decreased step length. She scores at an increased fall risk based upon TUG and BERG balance assessment. She will benefit from skilled PT to address the above stated deficits in order to optimize her function and reduce her risk of future falls.   OBJECTIVE IMPAIRMENTS: Abnormal gait, decreased activity tolerance, decreased balance, decreased endurance, decreased knowledge of condition,  difficulty walking, decreased strength, postural dysfunction, and pain.   ACTIVITY LIMITATIONS: carrying, lifting, bending, standing, squatting, stairs, transfers, and locomotion level  PARTICIPATION LIMITATIONS: meal prep, cleaning, laundry, shopping, community activity, and yard work  PERSONAL FACTORS: Age, Fitness, Time since onset of injury/illness/exacerbation, and 3+ comorbidities: see PMH above are also affecting patient's functional outcome.   REHAB POTENTIAL: Good  CLINICAL DECISION MAKING: Evolving/moderate complexity  EVALUATION COMPLEXITY: Moderate   GOALS: Goals reviewed with patient? Yes  SHORT TERM GOALS: Target date: 07/13/2024   Patient will be independent and compliant with initial HEP.   Baseline: initial HEP issued Goal status: INITIAL  2.  Patient will complete TUG in </= 12 seconds to reduce fall risk.   Baseline: see above Goal status: INITIAL  3.  Patient will demonstrate at least 4/5 bilateral hip flexor and 3+/5 hip extensor strength to improve gait stability.   Baseline: see above  Goal status: INITIAL   LONG TERM GOALS: Target date: 08/12/24  Patient will be independent with floor transfer.  Baseline: requires assistance from husband  Goal status: INITIAL  2.  Patient will be able to reach into bottom cabinets at home to pick up items.  Baseline: unable, requires husband to reach into bottom cabinets at home.  Goal status: INITIAL  3.  Patient will score >/= 45/56 on the BERG to reduce risk of falls.  Baseline: see above Goal status: INITIAL  4.  Patient will score at least 50% confidence on the ABC balance scale.  Baseline: see above Goal status: INITIAL  5.  Patient will be independent with advanced home program to progress/maintain current level of function.  Baseline: initial HEP issued  Goal status: INITIAL   PLAN:  PT FREQUENCY: 2x/week  PT DURATION: 8 weeks  PLANNED INTERVENTIONS: 97164- PT Re-evaluation, 97750- Physical Performance Testing, 97110-Therapeutic exercises, 97530- Therapeutic activity, [id number]- Neuromuscular re-education, 97535- Self Care, [id number]- Manual therapy, [id number]- Gait training, [phone] (1-2 muscles), 20561 (3+ muscles)- Dry Needling, Cryotherapy, and Moist heat  PLAN FOR NEXT SESSION: progress hip strengthening (adding more to HEP). Static balance activity. Sit to stands. Gait training working on increasing step length and improved clearance. Consider trial of directional preference for back pain (flexion?).   Shaneil Yazdi, PT, DPT, ATC 06/15/24 11:19 AM

## 2024-06-20 ENCOUNTER — Ambulatory Visit

## 2024-06-20 DIAGNOSIS — M6281 Muscle weakness (generalized): Secondary | ICD-10-CM | POA: Diagnosis not present

## 2024-06-20 DIAGNOSIS — Z9181 History of falling: Secondary | ICD-10-CM

## 2024-06-20 DIAGNOSIS — R2689 Other abnormalities of gait and mobility: Secondary | ICD-10-CM

## 2024-06-20 NOTE — Therapy (Signed)
 OUTPATIENT PHYSICAL THERAPY LOWER EXTREMITY TREATMENT   Patient Name: ANEYA DADDONA MRN: [id number] DOB:May 19, 1951, 73 y.o., female Today's Date: 06/20/2024  END OF SESSION:  PT End of Session - 06/20/24 1019     Visit Number 2    Number of Visits 17    Date for PT Re-Evaluation 08/12/24    Authorization Type [employer]    Progress Note Due on Visit 10    PT Start Time 1019    PT Stop Time 1059    PT Time Calculation (min) 40 min    Activity Tolerance Patient tolerated treatment well    Behavior During Therapy WFL for tasks assessed/performed           Past Medical History:  Diagnosis Date   Arthritis    Cancer (HCC)    skin nose   GERD (gastroesophageal reflux disease)    Heart murmur    when pregnant   Hepatitis    75  from food   History of recurrent UTIs    on  prophalatic med   Hypertension    Pneumonia    hx   Past Surgical History:  Procedure Laterality Date   CESAREAN SECTION     COLON SURGERY     resection for diverticulitis   TOTAL HIP ARTHROPLASTY     TOTAL HIP ARTHROPLASTY Left 07/17/2014   Procedure: LEFT TOTAL HIP ARTHROPLASTY ANTERIOR APPROACH;  Surgeon: Lonni CINDERELLA Poli, MD;  Location: [facility] OR;  Service: Orthopedics;  Laterality: Left;   Patient Active Problem List   Diagnosis Date Noted   Arthritis of left hip 07/17/2014   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/17/2014   GERD (gastroesophageal reflux disease)    HYPERTENSION 01/30/2010   DIVERTICULOSIS-COLON 01/30/2010   DIVERTICULITIS OF COLON 01/27/2010   CHANGE IN BOWELS 01/21/2010   ABDOMINAL PAIN-LLQ 01/21/2010   ABDOMINAL PAIN, LOWER 01/21/2010   UTI'S, HX OF 01/21/2010   HYPERLIPIDEMIA 10/28/2007   GERD 10/28/2007   HIATAL HERNIA 10/28/2007   IRRITABLE BOWEL SYNDROME 10/28/2007   OSTEOARTHRITIS 10/28/2007   OSTEOARTHRITIS 10/28/2007   ARTHRITIS 10/28/2007   History of colonic polyps 10/28/2007    PCP: Morgan Drivers, MD  REFERRING PROVIDER: Katina Pfeiffer, PA-C   REFERRING DIAG: R26.89 (ICD-10-CM) - Other abnormalities of gait and mobility   THERAPY DIAG:  Muscle weakness (generalized)  Other abnormalities of gait and mobility  History of falling  Rationale for Evaluation and Treatment: Rehabilitation  ONSET DATE: July 2024   SUBJECTIVE:   SUBJECTIVE STATEMENT: Patient reports she has been doing exercises twice a day. The exercises are getting easier. No falls since evaluation. Back will still bother her with prolonged standing/walking activity.   PERTINENT HISTORY: THA- bilateral  PAIN:  Are you having pain? No  PRECAUTIONS: Fall  RED FLAGS: None   WEIGHT BEARING RESTRICTIONS: No  FALLS:  Has patient fallen in last 6 months? Yes. Number of falls 1, patient reports she had LOB and fell in the living room  LIVING ENVIRONMENT: Lives with: lives with their spouse Lives in: House/apartment Stairs: No Has following equipment at home: Single point cane and Environmental consultant - 4 wheeled  OCCUPATION: retired  PLOF: Independent  PATIENT GOALS: I want to be able to have more strength in my legs and walk normal.  NEXT MD VISIT: September 2025   OBJECTIVE:  Note: Objective measures were completed at Evaluation unless otherwise noted.  PATIENT SURVEYS:  ABC scale: The Activities-Specific Balance Confidence (ABC) Scale 0% 10 20  30 40 50 60 70 80 90 100% No confidence<->completely confident  "How confident are you that you will not lose your balance or become unsteady when you . . .   Date tested 06/15/24  Walk around the house 100%  2. Walk up or down stairs 0%  3. Bend over and pick up a slipper from in front of a closet floor 10%  4. Reach for a small can off a shelf at eye level 100%  5. Stand on tip toes and reach for something above your head 10%  6. Stand on a chair and reach for something 0%  7. Sweep the floor 50%  8. Walk outside the house to a car parked in the driveway 100%  9. Get into or out of a car  100%  10. Walk across a parking lot to the mall 20%  11. Walk up or down a ramp 20%  12. Walk in a crowded mall where people rapidly walk past you 100%  13. Are bumped into by people as you walk through the mall 0%  14. Step onto or off of an escalator while you are holding onto the railing 0%  15. Step onto or off an escalator while holding onto parcels such that you cannot hold onto the railing 0%  16. Walk outside on icy sidewalks 0%  Total: #/16  38 %     COGNITION: Overall cognitive status: Within functional limits for tasks assessed     SENSATION: Not tested    POSTURE: rounded shoulders and forward head   LOWER EXTREMITY MMT:  MMT Right eval Left eval  Hip flexion 3+ 3+  Hip extension 3- 3-  Hip abduction 3+ 3+  Hip adduction    Hip internal rotation    Hip external rotation    Knee flexion 5 5  Knee extension 4- 4-  Ankle dorsiflexion 5 5  Ankle plantarflexion 5 seated 5 seated  Ankle inversion 5 5  Ankle eversion 5 5   (Blank rows = not tested)   FUNCTIONAL TESTS:  TUG: 15 seconds    BERG BALANCE TEST Sitting to Standing: 3.      Stands independently using hands Standing Unsupported: 4.      Stands safely for 2 minutes Sitting Unsupported: 4.     Sits for 2 minutes independently Standing to Sitting: 4.     Sits safely with minimal use of hands Transfers: 3.     Transfers safely definite use of hands Standing with eyes closed: 3.     Stands 10 seconds with supervision Standing with feet together: 3.     Stands for 1 minute with supervision Reaching forward with outstretched arm: 3.     Reaches forward 5 inches Retrieving object from the floor: 2.     Unable to pick up, but reaches within 1-2 inches independently Turning to look behind: 4.     Looks behind from both sides and weight shifts well Turning 360 degrees: 2.     Able to turn slowly, but safely Place alternate foot on stool: 1.     Completes >2 steps with minimal assist Standing with one foot  in front: 2.     Independent small step for 30 seconds Standing on one foot: 1.     Holds <3 seconds  Total Score: 36/56  GAIT: Distance walked: 20 ft  Assistive device utilized: None Level of assistance: Complete Independence Comments: limited foot clearance, small step length (not a true shuffling  pattern, but more like a small march)   [facility] Adult PT Treatment:                                                DATE: 06/20/24 Therapeutic Exercise: Seated march 2 x 10; green band  Prone quad stretch x 30 sec each  Standing calf raise 2 x 10  Standing toe raises 2 x 10  HEP review and update   Neuromuscular re-ed: Hip bridge 2 x 10  Clamshells 2 x 10  SLR partial range 2 x 5 each                                                                                                                             [facility] Adult PT Treatment:                                                DATE: 06/15/24 Therapeutic Exercise: Demonstrated,performed, and issued initial HEP.      PATIENT EDUCATION:  Education details: HEP Person educated: Patient Education method: Solicitor, Actor cues, Verbal cues, and Handouts Education comprehension: verbalized understanding, returned demonstration, verbal cues required, tactile cues required, and needs further education  HOME EXERCISE PROGRAM: Access Code: [email or web address] URL: [email or web address] Date: 06/20/2024 Prepared by: Lucie Meeter  Exercises - Romberg Stance  - 2 x daily - 7 x weekly - 3 sets - 30 sec  hold - Seated Long Arc Quad  - 2 x daily - 7 x weekly - 2 sets - 10 reps - Seated March with Resistance  - 2 x daily - 7 x weekly - 2 sets - 10 reps - Seated Hip Abduction with Resistance  - 2 x daily - 7 x weekly - 2 sets - 10 reps - Clamshell  - 2 x daily - 7 x weekly - 2 sets - 10 reps - Heel Raises with Counter Support  - 2 x daily - 7 x weekly - 2 sets - 10 reps - Toe Raises with Counter Support  - 2 x daily - 7 x  weekly - 2 sets - 10 reps  ASSESSMENT:  CLINICAL IMPRESSION: Reviewed HEP initially with patient having forgot to complete resisted marching as part of home program. Focused on progression of hip strengthening with good tolerance. Initially felt bridge working in the quads, but after stretching quads she felt more work in the [employer]. With SLR she has compensatory IR of the Rt hip requiring cues to correct.   EVAL: Patient is a 73 y.o. female who was seen today for physical therapy evaluation and treatment for other abnormalities of gait and mobility. Patient reports at least 1 year history of gait abnormalities  and lower extremity weakness. She does note onset of low back pain around the same time she began to notice leg weakness and gait abnormalities. Upon assessment she is noted to have significant bilateral hip weakness and mild quadriceps weakness. She has gait abnormalities with limited foot clearance and decreased step length. She scores at an increased fall risk based upon TUG and BERG balance assessment. She will benefit from skilled PT to address the above stated deficits in order to optimize her function and reduce her risk of future falls.   OBJECTIVE IMPAIRMENTS: Abnormal gait, decreased activity tolerance, decreased balance, decreased endurance, decreased knowledge of condition, difficulty walking, decreased strength, postural dysfunction, and pain.   ACTIVITY LIMITATIONS: carrying, lifting, bending, standing, squatting, stairs, transfers, and locomotion level  PARTICIPATION LIMITATIONS: meal prep, cleaning, laundry, shopping, community activity, and yard work  PERSONAL FACTORS: Age, Fitness, Time since onset of injury/illness/exacerbation, and 3+ comorbidities: see PMH above are also affecting patient's functional outcome.   REHAB POTENTIAL: Good  CLINICAL DECISION MAKING: Evolving/moderate complexity  EVALUATION COMPLEXITY: Moderate   GOALS: Goals reviewed with patient?  Yes  SHORT TERM GOALS: Target date: 07/13/2024   Patient will be independent and compliant with initial HEP.   Baseline: initial HEP issued Goal status: INITIAL  2.  Patient will complete TUG in </= 12 seconds to reduce fall risk.  Baseline: see above Goal status: INITIAL  3.  Patient will demonstrate at least 4/5 bilateral hip flexor and 3+/5 hip extensor strength to improve gait stability.   Baseline: see above  Goal status: INITIAL   LONG TERM GOALS: Target date: 08/12/24  Patient will be independent with floor transfer.  Baseline: requires assistance from husband  Goal status: INITIAL  2.  Patient will be able to reach into bottom cabinets at home to pick up items.  Baseline: unable, requires husband to reach into bottom cabinets at home.  Goal status: INITIAL  3.  Patient will score >/= 45/56 on the BERG to reduce risk of falls.  Baseline: see above Goal status: INITIAL  4.  Patient will score at least 50% confidence on the ABC balance scale.  Baseline: see above Goal status: INITIAL  5.  Patient will be independent with advanced home program to progress/maintain current level of function.  Baseline: initial HEP issued  Goal status: INITIAL   PLAN:  PT FREQUENCY: 2x/week  PT DURATION: 8 weeks  PLANNED INTERVENTIONS: 97164- PT Re-evaluation, 97750- Physical Performance Testing, 97110-Therapeutic exercises, 97530- Therapeutic activity, [id number]- Neuromuscular re-education, 97535- Self Care, [id number]- Manual therapy, [id number]- Gait training, [phone] (1-2 muscles), [id number] (3+ muscles)- Dry Needling, Cryotherapy, and Moist heat  PLAN FOR NEXT SESSION: progress hip strengthening. Static balance activity. Sit to stands. Gait training working on increasing step length and improved clearance. Consider trial of directional preference for back pain if needed (flexion?).   Malaya Cagley, PT, DPT, ATC 06/20/24 11:00 AM

## 2024-06-21 ENCOUNTER — Ambulatory Visit

## 2024-06-21 DIAGNOSIS — Z780 Asymptomatic menopausal state: Secondary | ICD-10-CM | POA: Diagnosis not present

## 2024-06-21 DIAGNOSIS — M8580 Other specified disorders of bone density and structure, unspecified site: Secondary | ICD-10-CM | POA: Diagnosis not present

## 2024-06-21 DIAGNOSIS — M810 Age-related osteoporosis without current pathological fracture: Secondary | ICD-10-CM | POA: Diagnosis not present

## 2024-06-22 ENCOUNTER — Ambulatory Visit

## 2024-06-22 DIAGNOSIS — M6281 Muscle weakness (generalized): Secondary | ICD-10-CM | POA: Diagnosis not present

## 2024-06-22 DIAGNOSIS — R2689 Other abnormalities of gait and mobility: Secondary | ICD-10-CM

## 2024-06-22 DIAGNOSIS — Z9181 History of falling: Secondary | ICD-10-CM

## 2024-06-22 NOTE — Therapy (Signed)
 OUTPATIENT PHYSICAL THERAPY LOWER EXTREMITY TREATMENT   Patient Name: ARLYN BUMPUS MRN: [id number] DOB:1951/06/19, 73 y.o., female Today's Date: 06/22/2024  END OF SESSION:  PT End of Session - 06/22/24 0936     Visit Number 3    Number of Visits 17    Date for PT Re-Evaluation 08/12/24    Authorization Type [employer]    Progress Note Due on Visit 10    PT Start Time 0935    PT Stop Time 1015    PT Time Calculation (min) 40 min    Activity Tolerance Patient tolerated treatment well    Behavior During Therapy WFL for tasks assessed/performed         Past Medical History:  Diagnosis Date   Arthritis    Cancer (HCC)    skin nose   GERD (gastroesophageal reflux disease)    Heart murmur    when pregnant   Hepatitis    75  from food   History of recurrent UTIs    on  prophalatic med   Hypertension    Pneumonia    hx   Past Surgical History:  Procedure Laterality Date   CESAREAN SECTION     COLON SURGERY     resection for diverticulitis   TOTAL HIP ARTHROPLASTY     TOTAL HIP ARTHROPLASTY Left 07/17/2014   Procedure: LEFT TOTAL HIP ARTHROPLASTY ANTERIOR APPROACH;  Surgeon: Lonni CINDERELLA Poli, MD;  Location: [facility] OR;  Service: Orthopedics;  Laterality: Left;   Patient Active Problem List   Diagnosis Date Noted   Arthritis of left hip 07/17/2014   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/17/2014   GERD (gastroesophageal reflux disease)    HYPERTENSION 01/30/2010   DIVERTICULOSIS-COLON 01/30/2010   DIVERTICULITIS OF COLON 01/27/2010   CHANGE IN BOWELS 01/21/2010   ABDOMINAL PAIN-LLQ 01/21/2010   ABDOMINAL PAIN, LOWER 01/21/2010   UTI'S, HX OF 01/21/2010   HYPERLIPIDEMIA 10/28/2007   GERD 10/28/2007   HIATAL HERNIA 10/28/2007   IRRITABLE BOWEL SYNDROME 10/28/2007   OSTEOARTHRITIS 10/28/2007   OSTEOARTHRITIS 10/28/2007   ARTHRITIS 10/28/2007   History of colonic polyps 10/28/2007    PCP: Morgan Drivers, MD  REFERRING PROVIDER: Katina Pfeiffer, PA-C   REFERRING DIAG: R26.89 (ICD-10-CM) - Other abnormalities of gait and mobility   THERAPY DIAG:  Muscle weakness (generalized)  Other abnormalities of gait and mobility  History of falling  Rationale for Evaluation and Treatment: Rehabilitation  ONSET DATE: July 2024   SUBJECTIVE:   SUBJECTIVE STATEMENT: Patient reports she was sore after last PT visit; states no falls since last visit. Patient states her balance is so-so today.   PERTINENT HISTORY: THA- bilateral  PAIN:  Are you having pain? No  PRECAUTIONS: Fall  RED FLAGS: None   WEIGHT BEARING RESTRICTIONS: No  FALLS:  Has patient fallen in last 6 months? Yes. Number of falls 1, patient reports she had LOB and fell in the living room  LIVING ENVIRONMENT: Lives with: lives with their spouse Lives in: House/apartment Stairs: No Has following equipment at home: Single point cane and Environmental consultant - 4 wheeled  OCCUPATION: retired  PLOF: Independent  PATIENT GOALS: I want to be able to have more strength in my legs and walk normal.  NEXT MD VISIT: September 2025   OBJECTIVE:  Note: Objective measures were completed at Evaluation unless otherwise noted.  PATIENT SURVEYS:  ABC scale: The Activities-Specific Balance Confidence (ABC) Scale 0% 10 20 30  40 50 60 70 80 90 100%  No confidence<->completely confident  "How confident are you that you will not lose your balance or become unsteady when you . . .   Date tested 06/15/24  Walk around the house 100%  2. Walk up or down stairs 0%  3. Bend over and pick up a slipper from in front of a closet floor 10%  4. Reach for a small can off a shelf at eye level 100%  5. Stand on tip toes and reach for something above your head 10%  6. Stand on a chair and reach for something 0%  7. Sweep the floor 50%  8. Walk outside the house to a car parked in the driveway 100%  9. Get into or out of a car 100%  10. Walk across a parking lot to the mall 20%  11.  Walk up or down a ramp 20%  12. Walk in a crowded mall where people rapidly walk past you 100%  13. Are bumped into by people as you walk through the mall 0%  14. Step onto or off of an escalator while you are holding onto the railing 0%  15. Step onto or off an escalator while holding onto parcels such that you cannot hold onto the railing 0%  16. Walk outside on icy sidewalks 0%  Total: #/16  38 %     COGNITION: Overall cognitive status: Within functional limits for tasks assessed     SENSATION: Not tested    POSTURE: rounded shoulders and forward head   LOWER EXTREMITY MMT:  MMT Right eval Left eval  Hip flexion 3+ 3+  Hip extension 3- 3-  Hip abduction 3+ 3+  Hip adduction    Hip internal rotation    Hip external rotation    Knee flexion 5 5  Knee extension 4- 4-  Ankle dorsiflexion 5 5  Ankle plantarflexion 5 seated 5 seated  Ankle inversion 5 5  Ankle eversion 5 5   (Blank rows = not tested)   FUNCTIONAL TESTS:  TUG: 15 seconds    BERG BALANCE TEST Sitting to Standing: 3.      Stands independently using hands Standing Unsupported: 4.      Stands safely for 2 minutes Sitting Unsupported: 4.     Sits for 2 minutes independently Standing to Sitting: 4.     Sits safely with minimal use of hands Transfers: 3.     Transfers safely definite use of hands Standing with eyes closed: 3.     Stands 10 seconds with supervision Standing with feet together: 3.     Stands for 1 minute with supervision Reaching forward with outstretched arm: 3.     Reaches forward 5 inches Retrieving object from the floor: 2.     Unable to pick up, but reaches within 1-2 inches independently Turning to look behind: 4.     Looks behind from both sides and weight shifts well Turning 360 degrees: 2.     Able to turn slowly, but safely Place alternate foot on stool: 1.     Completes >2 steps with minimal assist Standing with one foot in front: 2.     Independent small step for 30  seconds Standing on one foot: 1.     Holds <3 seconds  Total Score: 36/56  GAIT: Distance walked: 20 ft  Assistive device utilized: None Level of assistance: Complete Independence Comments: limited foot clearance, small step length (not a true shuffling pattern, but more like a small march)  [facility] Adult PT Treatment:                                                DATE: 06/22/2024 Therapeutic Exercise: Hooklying hip add isometric ball squeeze x10 Hooklying unilateral clamshells + green TB x10 (bil) Prone quad stretch w/strap 3x30 Neuromuscular re-ed: Bridges + blue TB for hip abd activation 2x10 Lt Side Lying: Resisted clamshells + red TB x10  Resisted bent knee hip abd + yellow TB x10 --> needed tactile assist for alignment Prone: Straight leg hip extension (Rt) Bent knee hip extension (Rt) --> primarily felt quad tension Therapeutic Activity: Sit to stand with no UE support x5, x8 Standing + 2#AW: Marching x20 Hip abd x10 (bil) Kick back x10 (bil)    [facility] Adult PT Treatment:                                                DATE: 06/20/24 Therapeutic Exercise: Seated march 2 x 10; green band  Prone quad stretch x 30 sec each  Standing calf raise 2 x 10  Standing toe raises 2 x 10  HEP review and update   Neuromuscular re-ed: Hip bridge 2 x 10  Clamshells 2 x 10  SLR partial range 2 x 5 each                                                                                                                             [facility] Adult PT Treatment:                                                DATE: 06/15/24 Therapeutic Exercise: Demonstrated,performed, and issued initial HEP.     PATIENT EDUCATION:  Education details: HEP Person educated: Patient Education method: Solicitor, Actor cues, Verbal cues, and Handouts Education comprehension: verbalized understanding, returned demonstration, verbal cues required, tactile cues required, and needs further  education  HOME EXERCISE PROGRAM: Access Code: [email or web address] URL: [email or web address] Date: 06/22/2024 Prepared by: Lamarr Price  Exercises - Romberg Stance  - 2 x daily - 7 x weekly - 3 sets - 30 sec  hold - Seated Long Arc Quad  - 2 x daily - 7 x weekly - 2 sets - 10 reps - Seated March with Resistance  - 2 x daily - 7 x weekly - 2 sets - 10 reps - Seated Hip Abduction with Resistance  - 2 x daily - 7 x weekly - 2 sets - 10 reps - Clamshell  - 2 x daily - 7 x weekly - 2  sets - 10 reps - Heel Raises with Counter Support  - 2 x daily - 7 x weekly - 2 sets - 10 reps - Toe Raises with Counter Support  - 2 x daily - 7 x weekly - 2 sets - 10 reps - Sit to Stand  - 1 x daily - 7 x weekly - 3 sets - 10 reps - Seated Hip Flexor Stretch  - 1 x daily - 7 x weekly - 1 sets - 3 reps - 30 sec hold  ASSESSMENT:  CLINICAL IMPRESSION: Hip strengthening continued, focusing on Rt hip due to weakness and instability. Sit to stand progressed to no UE support; initial unsteadiness with legs bracing against back of table, however form improved quickly with repetition. Functional LE strengthening challenged with added ankle weights with standing hip exercises. Glute activation on Rt LE improved with pelvic stability cues during prone hip extension. Patient will continues to benefit from skilled therapy to address strength deficits and functional balance.  EVAL: Patient is a 73 y.o. female who was seen today for physical therapy evaluation and treatment for other abnormalities of gait and mobility. Patient reports at least 1 year history of gait abnormalities and lower extremity weakness. She does note onset of low back pain around the same time she began to notice leg weakness and gait abnormalities. Upon assessment she is noted to have significant bilateral hip weakness and mild quadriceps weakness. She has gait abnormalities with limited foot clearance and decreased step length. She scores at an  increased fall risk based upon TUG and BERG balance assessment. She will benefit from skilled PT to address the above stated deficits in order to optimize her function and reduce her risk of future falls.   OBJECTIVE IMPAIRMENTS: Abnormal gait, decreased activity tolerance, decreased balance, decreased endurance, decreased knowledge of condition, difficulty walking, decreased strength, postural dysfunction, and pain.   ACTIVITY LIMITATIONS: carrying, lifting, bending, standing, squatting, stairs, transfers, and locomotion level  PARTICIPATION LIMITATIONS: meal prep, cleaning, laundry, shopping, community activity, and yard work  PERSONAL FACTORS: Age, Fitness, Time since onset of injury/illness/exacerbation, and 3+ comorbidities: see PMH above are also affecting patient's functional outcome.   REHAB POTENTIAL: Good  CLINICAL DECISION MAKING: Evolving/moderate complexity  EVALUATION COMPLEXITY: Moderate   GOALS: Goals reviewed with patient? Yes  SHORT TERM GOALS: Target date: 07/13/2024   Patient will be independent and compliant with initial HEP.   Baseline: initial HEP issued Goal status: INITIAL  2.  Patient will complete TUG in </= 12 seconds to reduce fall risk.  Baseline: see above Goal status: INITIAL  3.  Patient will demonstrate at least 4/5 bilateral hip flexor and 3+/5 hip extensor strength to improve gait stability.   Baseline: see above  Goal status: INITIAL   LONG TERM GOALS: Target date: 08/12/24  Patient will be independent with floor transfer.  Baseline: requires assistance from husband  Goal status: INITIAL  2.  Patient will be able to reach into bottom cabinets at home to pick up items.  Baseline: unable, requires husband to reach into bottom cabinets at home.  Goal status: INITIAL  3.  Patient will score >/= 45/56 on the BERG to reduce risk of falls.  Baseline: see above Goal status: INITIAL  4.  Patient will score at least 50% confidence on the ABC  balance scale.  Baseline: see above Goal status: INITIAL  5.  Patient will be independent with advanced home program to progress/maintain current level of function.  Baseline: initial HEP  issued  Goal status: INITIAL   PLAN:  PT FREQUENCY: 2x/week  PT DURATION: 8 weeks  PLANNED INTERVENTIONS: 97164- PT Re-evaluation, 97750- Physical Performance Testing, 97110-Therapeutic exercises, 97530- Therapeutic activity, [id number]- Neuromuscular re-education, 97535- Self Care, [id number]- Manual therapy, [id number]- Gait training, [phone] (1-2 muscles), 20561 (3+ muscles)- Dry Needling, Cryotherapy, and Moist heat  PLAN FOR NEXT SESSION: progress hip strengthening. Static balance activity. Sit to stands. Gait training working on increasing step length and improved clearance. Consider trial of directional preference for back pain if needed (flexion?).   Lamarr Price, PTA 06/22/24 10:21 AM

## 2024-06-27 ENCOUNTER — Ambulatory Visit: Attending: Family Medicine

## 2024-06-27 DIAGNOSIS — R2689 Other abnormalities of gait and mobility: Secondary | ICD-10-CM | POA: Insufficient documentation

## 2024-06-27 DIAGNOSIS — Z9181 History of falling: Secondary | ICD-10-CM | POA: Insufficient documentation

## 2024-06-27 DIAGNOSIS — M6281 Muscle weakness (generalized): Secondary | ICD-10-CM | POA: Diagnosis not present

## 2024-06-27 NOTE — Therapy (Signed)
 OUTPATIENT PHYSICAL THERAPY LOWER EXTREMITY TREATMENT   Patient Name: Jane [name] MRN: [id number] DOB:November 10, 1951, 73 y.o., female Today's Date: 06/27/2024  END OF SESSION:  PT End of Session - 06/27/24 0932     Visit Number 4    Number of Visits 17    Date for PT Re-Evaluation 08/12/24    Authorization Type [employer]    Progress Note Due on Visit 10    PT Start Time 0932    PT Stop Time 1015    PT Time Calculation (min) 43 min    Activity Tolerance Patient tolerated treatment well    Behavior During Therapy WFL for tasks assessed/performed         Past Medical History:  Diagnosis Date   Arthritis    Cancer (HCC)    skin nose   GERD (gastroesophageal reflux disease)    Heart murmur    when pregnant   Hepatitis    75  from food   History of recurrent UTIs    on  prophalatic med   Hypertension    Pneumonia    hx   Past Surgical History:  Procedure Laterality Date   CESAREAN SECTION     COLON SURGERY     resection for diverticulitis   TOTAL HIP ARTHROPLASTY     TOTAL HIP ARTHROPLASTY Left 07/17/2014   Procedure: LEFT TOTAL HIP ARTHROPLASTY ANTERIOR APPROACH;  Surgeon: Lonni CINDERELLA Poli, MD;  Location: [facility] OR;  Service: Orthopedics;  Laterality: Left;   Patient Active Problem List   Diagnosis Date Noted   Arthritis of left hip 07/17/2014   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/17/2014   GERD (gastroesophageal reflux disease)    HYPERTENSION 01/30/2010   DIVERTICULOSIS-COLON 01/30/2010   DIVERTICULITIS OF COLON 01/27/2010   CHANGE IN BOWELS 01/21/2010   ABDOMINAL PAIN-LLQ 01/21/2010   ABDOMINAL PAIN, LOWER 01/21/2010   UTI'S, HX OF 01/21/2010   HYPERLIPIDEMIA 10/28/2007   GERD 10/28/2007   HIATAL HERNIA 10/28/2007   IRRITABLE BOWEL SYNDROME 10/28/2007   OSTEOARTHRITIS 10/28/2007   OSTEOARTHRITIS 10/28/2007   ARTHRITIS 10/28/2007   History of colonic polyps 10/28/2007    PCP: Morgan Drivers, MD  REFERRING PROVIDER: Katina Pfeiffer, PA-C   REFERRING DIAG: R26.89 (ICD-10-CM) - Other abnormalities of gait and mobility   THERAPY DIAG:  Muscle weakness (generalized)  Other abnormalities of gait and mobility  History of falling  Rationale for Evaluation and Treatment: Rehabilitation  ONSET DATE: July 2024   SUBJECTIVE:   SUBJECTIVE STATEMENT: Patient reports she was sore after last visit; states she sat for a couple of hours on an outing and then had difficulty walking and stepping up onto a curb afterwards. Denies any falls since last visit.    PERTINENT HISTORY: THA- bilateral  PAIN:  Are you having pain? No  PRECAUTIONS: Fall  RED FLAGS: None   WEIGHT BEARING RESTRICTIONS: No  FALLS:  Has patient fallen in last 6 months? Yes. Number of falls 1, patient reports she had LOB and fell in the living room  LIVING ENVIRONMENT: Lives with: lives with their spouse Lives in: House/apartment Stairs: No Has following equipment at home: Single point cane and Environmental consultant - 4 wheeled  OCCUPATION: retired  PLOF: Independent  PATIENT GOALS: I want to be able to have more strength in my legs and walk normal.  NEXT MD VISIT: September 2025   OBJECTIVE:  Note: Objective measures were completed at Evaluation unless otherwise noted.  PATIENT SURVEYS:  ABC scale: The  Activities-Specific Balance Confidence (ABC) Scale 0% 10 20 30  40 50 60 70 80 90 100% No confidence<->completely confident  "How confident are you that you will not lose your balance or become unsteady when you . . .   Date tested 06/15/24  Walk around the house 100%  2. Walk up or down stairs 0%  3. Bend over and pick up a slipper from in front of a closet floor 10%  4. Reach for a small can off a shelf at eye level 100%  5. Stand on tip toes and reach for something above your head 10%  6. Stand on a chair and reach for something 0%  7. Sweep the floor 50%  8. Walk outside the house to a car parked in the driveway 100%  9. Get into  or out of a car 100%  10. Walk across a parking lot to the mall 20%  11. Walk up or down a ramp 20%  12. Walk in a crowded mall where people rapidly walk past you 100%  13. Are bumped into by people as you walk through the mall 0%  14. Step onto or off of an escalator while you are holding onto the railing 0%  15. Step onto or off an escalator while holding onto parcels such that you cannot hold onto the railing 0%  16. Walk outside on icy sidewalks 0%  Total: #/16  38 %     COGNITION: Overall cognitive status: Within functional limits for tasks assessed     SENSATION: Not tested    POSTURE: rounded shoulders and forward head   LOWER EXTREMITY MMT:  MMT Right eval Left eval  Hip flexion 3+ 3+  Hip extension 3- 3-  Hip abduction 3+ 3+  Hip adduction    Hip internal rotation    Hip external rotation    Knee flexion 5 5  Knee extension 4- 4-  Ankle dorsiflexion 5 5  Ankle plantarflexion 5 seated 5 seated  Ankle inversion 5 5  Ankle eversion 5 5   (Blank rows = not tested)   FUNCTIONAL TESTS:  TUG: 15 seconds    BERG BALANCE TEST Sitting to Standing: 3.      Stands independently using hands Standing Unsupported: 4.      Stands safely for 2 minutes Sitting Unsupported: 4.     Sits for 2 minutes independently Standing to Sitting: 4.     Sits safely with minimal use of hands Transfers: 3.     Transfers safely definite use of hands Standing with eyes closed: 3.     Stands 10 seconds with supervision Standing with feet together: 3.     Stands for 1 minute with supervision Reaching forward with outstretched arm: 3.     Reaches forward 5 inches Retrieving object from the floor: 2.     Unable to pick up, but reaches within 1-2 inches independently Turning to look behind: 4.     Looks behind from both sides and weight shifts well Turning 360 degrees: 2.     Able to turn slowly, but safely Place alternate foot on stool: 1.     Completes >2 steps with minimal  assist Standing with one foot in front: 2.     Independent small step for 30 seconds Standing on one foot: 1.     Holds <3 seconds  Total Score: 36/56  GAIT: Distance walked: 20 ft  Assistive device utilized: None Level of assistance: Complete Independence Comments: limited foot  clearance, small step length (not a true shuffling pattern, but more like a small march)   [facility] Adult PT Treatment:                                                DATE: 06/27/2024 Therapeutic Exercise: Supine modified piriformis stretch Seated hip flexor stretch (bil) Neuromuscular re-ed: Hooklying hip add (ball squeeze) & abd (green TB) isometrics 8x5 each Bridges + hip abd  with green TB  S/L clamshell red TB --> no resistance x10 S/L reverse clamshell x10 --> poor glute activation Bent over standing hip extension 2x10 (bil)  Therapeutic Activity: STS no UE support x10 Standing mini squats with seat tap on 2 pillows 2x5 4 step up/down + bilateral railing 3x10  Rhomberg eyes open + slow head turns/nods Modified tandem balance + slow head turn/nods   [facility] Adult PT Treatment:                                                DATE: 06/22/2024 Therapeutic Exercise: Hooklying hip add isometric ball squeeze x10 Hooklying unilateral clamshells + green TB x10 (bil) Prone quad stretch w/strap 3x30 Neuromuscular re-ed: Bridges + blue TB for hip abd activation 2x10 Lt Side Lying: Resisted clamshells + red TB x10  Resisted bent knee hip abd + yellow TB x10 --> needed tactile assist for alignment Prone: Straight leg hip extension (Rt) Bent knee hip extension (Rt) --> primarily felt quad tension Therapeutic Activity: Sit to stand with no UE support x5, x8 Standing + 2#AW: Marching x20 Hip abd x10 (bil) Kick back x10 (bil)    [facility] Adult PT Treatment:                                                DATE: 06/20/24 Therapeutic Exercise: Seated march 2 x 10; green band  Prone quad stretch x 30 sec each   Standing calf raise 2 x 10  Standing toe raises 2 x 10  HEP review and update  Neuromuscular re-ed: Hip bridge 2 x 10  Clamshells 2 x 10  SLR partial range 2 x 5 each                                                                                                                             PATIENT EDUCATION:  Education details: Updated HEP Person educated: Patient Education method: Explanation, Demonstration, Tactile cues, Verbal cues, and Handouts Education comprehension: verbalized understanding, returned demonstration, verbal cues required, tactile cues required, and needs further education  HOME EXERCISE PROGRAM: Access Code:  [email or web address] URL: [email or web address] Date: 06/27/2024 Prepared by: Lamarr Price  Exercises - Romberg Stance  - 2 x daily - 7 x weekly - 3 sets - 30 sec  hold - Seated Long Arc Quad  - 2 x daily - 7 x weekly - 2 sets - 10 reps - Seated March with Resistance  - 2 x daily - 7 x weekly - 2 sets - 10 reps - Seated Hip Abduction with Resistance  - 2 x daily - 7 x weekly - 2 sets - 10 reps - Clamshell  - 2 x daily - 7 x weekly - 2 sets - 10 reps - Heel Raises with Counter Support  - 2 x daily - 7 x weekly - 2 sets - 10 reps - Toe Raises with Counter Support  - 2 x daily - 7 x weekly - 2 sets - 10 reps - Sit to Stand  - 1 x daily - 7 x weekly - 3 sets - 10 reps - Seated Hip Flexor Stretch  - 1 x daily - 7 x weekly - 1 sets - 3 reps - 30 sec hold - Standing Romberg to 3/4 Tandem Stance  - 1 x daily - 7 x weekly - 1 sets - 3 reps - 30 sec hold - Mini Squat  - 1 x daily - 7 x weekly - 3 sets - 10 reps  ASSESSMENT:  CLINICAL IMPRESSION: Functional LE strengthening progressed with squats, gradually increasing range as tolerated. Initial moderate unsteadiness with step up activity with decreasing UE support, however postural stability improved with repetition. Increased postural sway noted with head looking up compared to minimal sway with head  movements in other directions.   EVAL: Patient is a 73 y.o. female who was seen today for physical therapy evaluation and treatment for other abnormalities of gait and mobility. Patient reports at least 1 year history of gait abnormalities and lower extremity weakness. She does note onset of low back pain around the same time she began to notice leg weakness and gait abnormalities. Upon assessment she is noted to have significant bilateral hip weakness and mild quadriceps weakness. She has gait abnormalities with limited foot clearance and decreased step length. She scores at an increased fall risk based upon TUG and BERG balance assessment. She will benefit from skilled PT to address the above stated deficits in order to optimize her function and reduce her risk of future falls.   OBJECTIVE IMPAIRMENTS: Abnormal gait, decreased activity tolerance, decreased balance, decreased endurance, decreased knowledge of condition, difficulty walking, decreased strength, postural dysfunction, and pain.   ACTIVITY LIMITATIONS: carrying, lifting, bending, standing, squatting, stairs, transfers, and locomotion level  PARTICIPATION LIMITATIONS: meal prep, cleaning, laundry, shopping, community activity, and yard work  PERSONAL FACTORS: Age, Fitness, Time since onset of injury/illness/exacerbation, and 3+ comorbidities: see PMH above are also affecting patient's functional outcome.   REHAB POTENTIAL: Good  CLINICAL DECISION MAKING: Evolving/moderate complexity  EVALUATION COMPLEXITY: Moderate   GOALS: Goals reviewed with patient? Yes  SHORT TERM GOALS: Target date: 07/13/2024   Patient will be independent and compliant with initial HEP.   Baseline: initial HEP issued Goal status: INITIAL  2.  Patient will complete TUG in </= 12 seconds to reduce fall risk.  Baseline: see above Goal status: INITIAL  3.  Patient will demonstrate at least 4/5 bilateral hip flexor and 3+/5 hip extensor strength to  improve gait stability.   Baseline: see above  Goal status: INITIAL   LONG TERM GOALS: Target  date: 08/12/24  Patient will be independent with floor transfer.  Baseline: requires assistance from husband  Goal status: INITIAL  2.  Patient will be able to reach into bottom cabinets at home to pick up items.  Baseline: unable, requires husband to reach into bottom cabinets at home.  Goal status: INITIAL  3.  Patient will score >/= 45/56 on the BERG to reduce risk of falls.  Baseline: see above Goal status: INITIAL  4.  Patient will score at least 50% confidence on the ABC balance scale.  Baseline: see above Goal status: INITIAL  5.  Patient will be independent with advanced home program to progress/maintain current level of function.  Baseline: initial HEP issued  Goal status: INITIAL   PLAN:  PT FREQUENCY: 2x/week  PT DURATION: 8 weeks  PLANNED INTERVENTIONS: 97164- PT Re-evaluation, 97750- Physical Performance Testing, 97110-Therapeutic exercises, 97530- Therapeutic activity, [id number]- Neuromuscular re-education, 97535- Self Care, [id number]- Manual therapy, [id number]- Gait training, [phone] (1-2 muscles), [id number] (3+ muscles)- Dry Needling, Cryotherapy, and Moist heat  PLAN FOR NEXT SESSION: progress hip strengthening. Static & dynamic balance activities. Sit to stands. Gait training working on increasing step length and improved clearance; curb navigation. Consider trial of directional preference for back pain if needed (flexion?).   Lamarr Price, PTA 06/27/24 10:59 AM

## 2024-06-29 ENCOUNTER — Ambulatory Visit

## 2024-06-29 DIAGNOSIS — R2689 Other abnormalities of gait and mobility: Secondary | ICD-10-CM

## 2024-06-29 DIAGNOSIS — Z9181 History of falling: Secondary | ICD-10-CM

## 2024-06-29 DIAGNOSIS — M6281 Muscle weakness (generalized): Secondary | ICD-10-CM | POA: Diagnosis not present

## 2024-06-29 NOTE — Therapy (Signed)
 OUTPATIENT PHYSICAL THERAPY LOWER EXTREMITY TREATMENT   Patient Name: Jane [name] MRN: [id number] DOB:08/17/1951, 73 y.o., female Today's Date: 06/29/2024  END OF SESSION:  PT End of Session - 06/29/24 1317     Visit Number 5    Number of Visits 17    Date for PT Re-Evaluation 08/12/24    Authorization Type [employer]    Progress Note Due on Visit 10    PT Start Time 1317    PT Stop Time 1359    PT Time Calculation (min) 42 min    Activity Tolerance Patient tolerated treatment well    Behavior During Therapy WFL for tasks assessed/performed         Past Medical History:  Diagnosis Date   Arthritis    Cancer (HCC)    skin nose   GERD (gastroesophageal reflux disease)    Heart murmur    when pregnant   Hepatitis    75  from food   History of recurrent UTIs    on  prophalatic med   Hypertension    Pneumonia    hx   Past Surgical History:  Procedure Laterality Date   CESAREAN SECTION     COLON SURGERY     resection for diverticulitis   TOTAL HIP ARTHROPLASTY     TOTAL HIP ARTHROPLASTY Left 07/17/2014   Procedure: LEFT TOTAL HIP ARTHROPLASTY ANTERIOR APPROACH;  Surgeon: Lonni CINDERELLA Poli, MD;  Location: [facility] OR;  Service: Orthopedics;  Laterality: Left;   Patient Active Problem List   Diagnosis Date Noted   Arthritis of left hip 07/17/2014   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/17/2014   GERD (gastroesophageal reflux disease)    HYPERTENSION 01/30/2010   DIVERTICULOSIS-COLON 01/30/2010   DIVERTICULITIS OF COLON 01/27/2010   CHANGE IN BOWELS 01/21/2010   ABDOMINAL PAIN-LLQ 01/21/2010   ABDOMINAL PAIN, LOWER 01/21/2010   UTI'S, HX OF 01/21/2010   HYPERLIPIDEMIA 10/28/2007   GERD 10/28/2007   HIATAL HERNIA 10/28/2007   IRRITABLE BOWEL SYNDROME 10/28/2007   OSTEOARTHRITIS 10/28/2007   OSTEOARTHRITIS 10/28/2007   ARTHRITIS 10/28/2007   History of colonic polyps 10/28/2007    PCP: Morgan Drivers, MD  REFERRING PROVIDER: Katina Pfeiffer, PA-C   REFERRING DIAG: R26.89 (ICD-10-CM) - Other abnormalities of gait and mobility   THERAPY DIAG:  Muscle weakness (generalized)  Other abnormalities of gait and mobility  History of falling  Rationale for Evaluation and Treatment: Rehabilitation  ONSET DATE: July 2024   SUBJECTIVE:   SUBJECTIVE STATEMENT: Patient reports her back is hurting today, states she did a lot of walking at the store earlier today and she had a hard time getting back out to the car, taking small steps and holding onto shopping cart. Patient reports she was not as sore after last session as the time before.  PERTINENT HISTORY: THA- bilateral  PAIN:  Are you having pain? No  PRECAUTIONS: Fall  RED FLAGS: None   WEIGHT BEARING RESTRICTIONS: No  FALLS:  Has patient fallen in last 6 months? Yes. Number of falls 1, patient reports she had LOB and fell in the living room  LIVING ENVIRONMENT: Lives with: lives with their spouse Lives in: House/apartment Stairs: No Has following equipment at home: Single point cane and Environmental consultant - 4 wheeled  OCCUPATION: retired  PLOF: Independent  PATIENT GOALS: I want to be able to have more strength in my legs and walk normal.  NEXT MD VISIT: September 2025   OBJECTIVE:  Note: Objective measures  were completed at Evaluation unless otherwise noted.  PATIENT SURVEYS:  ABC scale: The Activities-Specific Balance Confidence (ABC) Scale 0% 10 20 30  40 50 60 70 80 90 100% No confidence<->completely confident  "How confident are you that you will not lose your balance or become unsteady when you . . .   Date tested 06/15/24  Walk around the house 100%  2. Walk up or down stairs 0%  3. Bend over and pick up a slipper from in front of a closet floor 10%  4. Reach for a small can off a shelf at eye level 100%  5. Stand on tip toes and reach for something above your head 10%  6. Stand on a chair and reach for something 0%  7. Sweep the floor 50%  8.  Walk outside the house to a car parked in the driveway 100%  9. Get into or out of a car 100%  10. Walk across a parking lot to the mall 20%  11. Walk up or down a ramp 20%  12. Walk in a crowded mall where people rapidly walk past you 100%  13. Are bumped into by people as you walk through the mall 0%  14. Step onto or off of an escalator while you are holding onto the railing 0%  15. Step onto or off an escalator while holding onto parcels such that you cannot hold onto the railing 0%  16. Walk outside on icy sidewalks 0%  Total: #/16  38 %     COGNITION: Overall cognitive status: Within functional limits for tasks assessed     SENSATION: Not tested    POSTURE: rounded shoulders and forward head   LOWER EXTREMITY MMT:  MMT Right eval Left eval  Hip flexion 3+ 3+  Hip extension 3- 3-  Hip abduction 3+ 3+  Hip adduction    Hip internal rotation    Hip external rotation    Knee flexion 5 5  Knee extension 4- 4-  Ankle dorsiflexion 5 5  Ankle plantarflexion 5 seated 5 seated  Ankle inversion 5 5  Ankle eversion 5 5   (Blank rows = not tested)   FUNCTIONAL TESTS:  TUG: 15 seconds    BERG BALANCE TEST Sitting to Standing: 3.      Stands independently using hands Standing Unsupported: 4.      Stands safely for 2 minutes Sitting Unsupported: 4.     Sits for 2 minutes independently Standing to Sitting: 4.     Sits safely with minimal use of hands Transfers: 3.     Transfers safely definite use of hands Standing with eyes closed: 3.     Stands 10 seconds with supervision Standing with feet together: 3.     Stands for 1 minute with supervision Reaching forward with outstretched arm: 3.     Reaches forward 5 inches Retrieving object from the floor: 2.     Unable to pick up, but reaches within 1-2 inches independently Turning to look behind: 4.     Looks behind from both sides and weight shifts well Turning 360 degrees: 2.     Able to turn slowly, but safely Place  alternate foot on stool: 1.     Completes >2 steps with minimal assist Standing with one foot in front: 2.     Independent small step for 30 seconds Standing on one foot: 1.     Holds <3 seconds  Total Score: 36/56  GAIT: Distance walked: 20  ft  Assistive device utilized: None Level of assistance: Complete Independence Comments: limited foot clearance, small step length (not a true shuffling pattern, but more like a small march)   [facility] Adult PT Treatment:                                                DATE: 06/29/2024 Therapeutic Exercise: Seated forward flexion stretch with red PB roll out x10 LTR x 2 min Seated marching + red TB crossed at forefoot 2x10 Standing hip extension + 2#AW x 12 (bil) Standing marching + 2#AW x 20 Therapeutic Activity: Sit to stand x 5 Standing squats --> seat tap on 2 pillows x 10 --> seat tap on 1 pillow x 5 Standing marching + 2#AW Alt foot taps on edge of treadmill + 2#AW no HHA (SBA) x 10  Curb step up/down + 1 railing --> step up/down with 1 railing & SPC Gait: Gait training with [facility]   [facility] Adult PT Treatment:                                                DATE: 06/27/2024 Therapeutic Exercise: Supine modified piriformis stretch Seated hip flexor stretch (bil) Neuromuscular re-ed: Hooklying hip add (ball squeeze) & abd (green TB) isometrics 8x5 each Bridges + hip abd  with green TB  S/L clamshell red TB --> no resistance x10 S/L reverse clamshell x10 --> poor glute activation Bent over standing hip extension 2x10 (bil)  Therapeutic Activity: STS no UE support x10 Standing mini squats with seat tap on 2 pillows 2x5 4 step up/down + bilateral railing 3x10  Rhomberg eyes open + slow head turns/nods Modified tandem balance + slow head turn/nods   [facility] Adult PT Treatment:                                                DATE: 06/22/2024 Therapeutic Exercise: Hooklying hip add isometric ball squeeze x10 Hooklying unilateral clamshells + green  TB x10 (bil) Prone quad stretch w/strap 3x30 Neuromuscular re-ed: Bridges + blue TB for hip abd activation 2x10 Lt Side Lying: Resisted clamshells + red TB x10  Resisted bent knee hip abd + yellow TB x10 --> needed tactile assist for alignment Prone: Straight leg hip extension (Rt) Bent knee hip extension (Rt) --> primarily felt quad tension Therapeutic Activity: Sit to stand with no UE support x5, x8 Standing + 2#AW: Marching x20 Hip abd x10 (bil) Kick back x10 (bil)  PATIENT EDUCATION:  Education details: Updated HEP Person educated: Patient Education method: Explanation, Demonstration, Tactile cues, Verbal cues, and Handouts Education comprehension: verbalized understanding, returned demonstration, verbal cues required, tactile cues required, and needs further education  HOME EXERCISE PROGRAM: Access Code: [email or web address] URL: [email or web address] Date: 06/29/2024 Prepared by: Lamarr Price  Exercises - Romberg Stance  - 2 x daily - 7 x weekly - 3 sets - 30 sec  hold - Seated Long Arc Quad  - 2 x daily - 7 x weekly - 2 sets - 10 reps - Seated March with Resistance  - 2 x daily - 7 x weekly - 2 sets - 10 reps - Seated Hip Abduction with Resistance  - 2 x daily - 7 x weekly - 2 sets - 10 reps - Clamshell  - 2 x daily - 7 x weekly - 2 sets - 10 reps - Heel Raises with Counter Support  - 2 x daily - 7 x weekly - 2 sets - 10 reps - Toe Raises with Counter Support  - 2 x daily - 7 x weekly - 2 sets - 10 reps - Sit to Stand  - 1 x daily - 7 x weekly - 3 sets - 10 reps - Seated Hip Flexor Stretch  - 1 x daily - 7 x weekly - 1 sets - 3 reps - 30 sec hold - Standing Romberg to 3/4 Tandem Stance  - 1 x daily - 7 x weekly - 1 sets - 3 reps - 30 sec hold - Mini Squat  - 1 x daily - 7 x weekly - 3 sets - 10 reps - Supine Lower Trunk Rotation  - 1 x daily - 7  x weekly - 3 sets - 10 reps - Seated Flexion Stretch  - 1 x daily - 7 x weekly - 3 sets - 10 reps  ASSESSMENT:  CLINICAL IMPRESSION: Gait training completed with SPC and recommended patient use AD for long distances; plan to continue gait training without AD and focus on increasing stride length and foot clearance. Curb negotiation performed with unilateral railing and SPC; initial instability demonstrated with step down (R>L), however stability improved with repetition on activity. Seated rest breaks taken as needed due to fatigue in LE with standing exercises.  EVAL: Patient is a 73 y.o. female who was seen today for physical therapy evaluation and treatment for other abnormalities of gait and mobility. Patient reports at least 1 year history of gait abnormalities and lower extremity weakness. She does note onset of low back pain around the same time she began to notice leg weakness and gait abnormalities. Upon assessment she is noted to have significant bilateral hip weakness and mild quadriceps weakness. She has gait abnormalities with limited foot clearance and decreased step length. She scores at an increased fall risk based upon TUG and BERG balance assessment. She will benefit from skilled PT to address the above stated deficits in order to optimize her function and reduce her risk of future falls.   OBJECTIVE IMPAIRMENTS: Abnormal gait, decreased activity tolerance, decreased balance, decreased endurance, decreased knowledge of condition, difficulty walking, decreased strength, postural dysfunction, and pain.   ACTIVITY LIMITATIONS: carrying, lifting, bending, standing, squatting, stairs, transfers, and locomotion level  PARTICIPATION LIMITATIONS: meal prep, cleaning, laundry, shopping, community activity, and yard work  PERSONAL FACTORS: Age, Fitness, Time since onset of injury/illness/exacerbation, and 3+ comorbidities: see PMH above are also affecting patient's functional outcome.    REHAB POTENTIAL: Good  CLINICAL DECISION MAKING:  Evolving/moderate complexity  EVALUATION COMPLEXITY: Moderate   GOALS: Goals reviewed with patient? Yes  SHORT TERM GOALS: Target date: 07/13/2024  Patient will be independent and compliant with initial HEP.  Baseline: initial HEP issued Goal status: INITIAL  2.  Patient will complete TUG in </= 12 seconds to reduce fall risk.  Baseline: see above Goal status: INITIAL  3.  Patient will demonstrate at least 4/5 bilateral hip flexor and 3+/5 hip extensor strength to improve gait stability.   Baseline: see above  Goal status: INITIAL   LONG TERM GOALS: Target date: 08/12/24  Patient will be independent with floor transfer.  Baseline: requires assistance from husband  Goal status: INITIAL  2.  Patient will be able to reach into bottom cabinets at home to pick up items.  Baseline: unable, requires husband to reach into bottom cabinets at home.  Goal status: INITIAL  3.  Patient will score >/= 45/56 on the BERG to reduce risk of falls.  Baseline: see above Goal status: INITIAL  4.  Patient will score at least 50% confidence on the ABC balance scale.  Baseline: see above Goal status: INITIAL  5.  Patient will be independent with advanced home program to progress/maintain current level of function.  Baseline: initial HEP issued  Goal status: INITIAL   PLAN:  PT FREQUENCY: 2x/week  PT DURATION: 8 weeks  PLANNED INTERVENTIONS: 97164- PT Re-evaluation, 97750- Physical Performance Testing, 97110-Therapeutic exercises, 97530- Therapeutic activity, [id number]- Neuromuscular re-education, 97535- Self Care, [id number]- Manual therapy, [id number]- Gait training, [phone] (1-2 muscles), [id number] (3+ muscles)- Dry Needling, Cryotherapy, and Moist heat  PLAN FOR NEXT SESSION: progress hip strengthening. Static & dynamic balance activities. Sit to stands. Gait training working on increasing step length and improved clearance; curb navigation. Good  response to lumbar flexion stretch.  Lamarr Price, PTA 06/29/24 2:00 PM

## 2024-07-04 ENCOUNTER — Ambulatory Visit

## 2024-07-04 DIAGNOSIS — M6281 Muscle weakness (generalized): Secondary | ICD-10-CM

## 2024-07-04 DIAGNOSIS — Z9181 History of falling: Secondary | ICD-10-CM

## 2024-07-04 DIAGNOSIS — R2689 Other abnormalities of gait and mobility: Secondary | ICD-10-CM

## 2024-07-04 NOTE — Therapy (Signed)
 OUTPATIENT PHYSICAL THERAPY LOWER EXTREMITY TREATMENT   Patient Name: Jane [name] MRN: [id number] DOB:[birth year], 73 y.o., female Today's Date: 07/04/2024  END OF SESSION:  PT End of Session - 07/04/24 1018     Visit Number 6    Number of Visits 17    Date for PT Re-Evaluation 08/12/24    Authorization Type [employer]    Progress Note Due on Visit 10    PT Start Time 1018    PT Stop Time 1059    PT Time Calculation (min) 41 min    Equipment Utilized During Treatment Gait belt    Activity Tolerance Patient tolerated treatment well    Behavior During Therapy WFL for tasks assessed/performed          Past Medical History:  Diagnosis Date   Arthritis    Cancer (HCC)    skin nose   GERD (gastroesophageal reflux disease)    Heart murmur    when pregnant   Hepatitis    75  from food   History of recurrent UTIs    on  prophalatic med   Hypertension    Pneumonia    hx   Past Surgical History:  Procedure Laterality Date   CESAREAN SECTION     COLON SURGERY     resection for diverticulitis   TOTAL HIP ARTHROPLASTY     TOTAL HIP ARTHROPLASTY Left 07/17/2014   Procedure: LEFT TOTAL HIP ARTHROPLASTY ANTERIOR APPROACH;  Surgeon: Lonni CINDERELLA Poli, MD;  Location: [facility] OR;  Service: Orthopedics;  Laterality: Left;   Patient Active Problem List   Diagnosis Date Noted   Arthritis of left hip 07/17/2014   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/17/2014   GERD (gastroesophageal reflux disease)    HYPERTENSION 01/30/2010   DIVERTICULOSIS-COLON 01/30/2010   DIVERTICULITIS OF COLON 01/27/2010   CHANGE IN BOWELS 01/21/2010   ABDOMINAL PAIN-LLQ 01/21/2010   ABDOMINAL PAIN, LOWER 01/21/2010   UTI'S, HX OF 01/21/2010   HYPERLIPIDEMIA 10/28/2007   GERD 10/28/2007   HIATAL HERNIA 10/28/2007   IRRITABLE BOWEL SYNDROME 10/28/2007   OSTEOARTHRITIS 10/28/2007   OSTEOARTHRITIS 10/28/2007   ARTHRITIS 10/28/2007   History of colonic polyps 10/28/2007    PCP:  Morgan Drivers, MD  REFERRING PROVIDER: Katina Pfeiffer, PA-C   REFERRING DIAG: R26.89 (ICD-10-CM) - Other abnormalities of gait and mobility   THERAPY DIAG:  Muscle weakness (generalized)  Other abnormalities of gait and mobility  History of falling  Rationale for Evaluation and Treatment: Rehabilitation  ONSET DATE: July 2024   SUBJECTIVE:   SUBJECTIVE STATEMENT: Sometimes the back still gets sore. No falls since last session. Patient reports she does feel a little bit better, but has good days and bad days. She can't find her cane at home.   PERTINENT HISTORY: THA- bilateral  PAIN:  Are you having pain? No  PRECAUTIONS: Fall  RED FLAGS: None   WEIGHT BEARING RESTRICTIONS: No  FALLS:  Has patient fallen in last 6 months? Yes. Number of falls 1, patient reports she had LOB and fell in the living room  LIVING ENVIRONMENT: Lives with: lives with their spouse Lives in: House/apartment Stairs: No Has following equipment at home: Single point cane and Environmental consultant - 4 wheeled  OCCUPATION: retired  PLOF: Independent  PATIENT GOALS: I want to be able to have more strength in my legs and walk normal.  NEXT MD VISIT: September 2025   OBJECTIVE:  Note: Objective measures were completed at Evaluation unless otherwise noted.  PATIENT SURVEYS:  ABC scale: The Activities-Specific Balance Confidence (ABC) Scale 0% 10 20 30  40 50 60 70 80 90 100% No confidence<->completely confident  "How confident are you that you will not lose your balance or become unsteady when you . . .   Date tested 06/15/24  Walk around the house 100%  2. Walk up or down stairs 0%  3. Bend over and pick up a slipper from in front of a closet floor 10%  4. Reach for a small can off a shelf at eye level 100%  5. Stand on tip toes and reach for something above your head 10%  6. Stand on a chair and reach for something 0%  7. Sweep the floor 50%  8. Walk outside the house to a car parked in the  driveway 100%  9. Get into or out of a car 100%  10. Walk across a parking lot to the mall 20%  11. Walk up or down a ramp 20%  12. Walk in a crowded mall where people rapidly walk past you 100%  13. Are bumped into by people as you walk through the mall 0%  14. Step onto or off of an escalator while you are holding onto the railing 0%  15. Step onto or off an escalator while holding onto parcels such that you cannot hold onto the railing 0%  16. Walk outside on icy sidewalks 0%  Total: #/16  38 %     COGNITION: Overall cognitive status: Within functional limits for tasks assessed     SENSATION: Not tested    POSTURE: rounded shoulders and forward head   LOWER EXTREMITY MMT:  MMT Right eval Left eval 07/04/24  Hip flexion 3+ 3+ Rt: 3+; Lt: 4-  Hip extension 3- 3-   Hip abduction 3+ 3+   Hip adduction     Hip internal rotation     Hip external rotation     Knee flexion 5 5   Knee extension 4- 4- 4+ bilateral  Ankle dorsiflexion 5 5   Ankle plantarflexion 5 seated 5 seated   Ankle inversion 5 5   Ankle eversion 5 5    (Blank rows = not tested)   FUNCTIONAL TESTS:  TUG: 15 seconds    BERG BALANCE TEST Sitting to Standing: 3.      Stands independently using hands Standing Unsupported: 4.      Stands safely for 2 minutes Sitting Unsupported: 4.     Sits for 2 minutes independently Standing to Sitting: 4.     Sits safely with minimal use of hands Transfers: 3.     Transfers safely definite use of hands Standing with eyes closed: 3.     Stands 10 seconds with supervision Standing with feet together: 3.     Stands for 1 minute with supervision Reaching forward with outstretched arm: 3.     Reaches forward 5 inches Retrieving object from the floor: 2.     Unable to pick up, but reaches within 1-2 inches independently Turning to look behind: 4.     Looks behind from both sides and weight shifts well Turning 360 degrees: 2.     Able to turn slowly, but safely Place  alternate foot on stool: 1.     Completes >2 steps with minimal assist Standing with one foot in front: 2.     Independent small step for 30 seconds Standing on one foot: 1.     Holds <3 seconds  Total Score: 36/56  GAIT: Distance walked: 20 ft  Assistive device utilized: None Level of assistance: Complete Independence Comments: limited foot clearance, small step length (not a true shuffling pattern, but more like a small march)  [facility] Adult PT Treatment:                                                DATE: 07/04/24 Therapeutic Exercise: NuStep level 4 x 5 minutes UE/LE LAQ 2 x 10; 1 lbs  Heel raises 2 x 10  Calf raises x 10   Gait training: Pre-gait: forward step overs x 10 each with single UE support working on heel strike Gait training in [facility] hallway and outside on sidewalk focusing on narrowing base and allowing for heel strike and push-off  Therapeutic Activity: Curb negotiation requires HHA   [facility] Adult PT Treatment:                                                DATE: 06/29/2024 Therapeutic Exercise: Seated forward flexion stretch with red PB roll out x10 LTR x 2 min Seated marching + red TB crossed at forefoot 2x10 Standing hip extension + 2#AW x 12 (bil) Standing marching + 2#AW x 20 Therapeutic Activity: Sit to stand x 5 Standing squats --> seat tap on 2 pillows x 10 --> seat tap on 1 pillow x 5 Standing marching + 2#AW Alt foot taps on edge of treadmill + 2#AW no HHA (SBA) x 10  Curb step up/down + 1 railing --> step up/down with 1 railing & SPC Gait: Gait training with [employer] [facility]   [facility] Adult PT Treatment:                                                DATE: 06/27/2024 Therapeutic Exercise: Supine modified piriformis stretch Seated hip flexor stretch (bil) Neuromuscular re-ed: Hooklying hip add (ball squeeze) & abd (green TB) isometrics 8x5 each Bridges + hip abd  with green TB  S/L clamshell red TB --> no resistance x10 S/L reverse clamshell x10 --> poor  glute activation Bent over standing hip extension 2x10 (bil)  Therapeutic Activity: STS no UE support x10 Standing mini squats with seat tap on 2 pillows 2x5 4 step up/down + bilateral railing 3x10  Rhomberg eyes open + slow head turns/nods Modified tandem balance + slow head turn/nods   [facility] Adult PT Treatment:                                                DATE: 06/22/2024 Therapeutic Exercise: Hooklying hip add isometric ball squeeze x10 Hooklying unilateral clamshells + green TB x10 (bil) Prone quad stretch w/strap 3x30 Neuromuscular re-ed: Bridges + blue TB for hip abd activation 2x10 Lt Side Lying: Resisted clamshells + red TB x10  Resisted bent knee hip abd + yellow TB x10 --> needed tactile assist for alignment Prone: Straight leg hip extension (Rt) Bent knee hip extension (Rt) --> primarily felt  quad tension Therapeutic Activity: Sit to stand with no UE support x5, x8 Standing + 2#AW: Marching x20 Hip abd x10 (bil) Kick back x10 (bil)                                                                                                                           PATIENT EDUCATION:  Education details: HEP review  Person educated: Patient Education method: Explanation Education comprehension: verbalized understanding  HOME EXERCISE PROGRAM: Access Code: [email or web address] URL: [email or web address] Date: 06/29/2024 Prepared by: Lamarr Price  Exercises - Romberg Stance  - 2 x daily - 7 x weekly - 3 sets - 30 sec  hold - Seated Long Arc Quad  - 2 x daily - 7 x weekly - 2 sets - 10 reps - Seated March with Resistance  - 2 x daily - 7 x weekly - 2 sets - 10 reps - Seated Hip Abduction with Resistance  - 2 x daily - 7 x weekly - 2 sets - 10 reps - Clamshell  - 2 x daily - 7 x weekly - 2 sets - 10 reps - Heel Raises with Counter Support  - 2 x daily - 7 x weekly - 2 sets - 10 reps - Toe Raises with Counter Support  - 2 x daily - 7 x weekly - 2 sets - 10 reps - Sit to  Stand  - 1 x daily - 7 x weekly - 3 sets - 10 reps - Seated Hip Flexor Stretch  - 1 x daily - 7 x weekly - 1 sets - 3 reps - 30 sec hold - Standing Romberg to 3/4 Tandem Stance  - 1 x daily - 7 x weekly - 1 sets - 3 reps - 30 sec hold - Mini Squat  - 1 x daily - 7 x weekly - 3 sets - 10 reps - Supine Lower Trunk Rotation  - 1 x daily - 7 x weekly - 3 sets - 10 reps - Seated Flexion Stretch  - 1 x daily - 7 x weekly - 3 sets - 10 reps  ASSESSMENT:  CLINICAL IMPRESSION: Focused on gait training working on allowing for proper heel strike/push-off. She is initially unstable when she performs this gait pattern requiring occasional use of wall for support when walking. With continued practice she is more stable, but remains in widen base throughout gait cycle. With curb negotiation she requires HHA stating that her legs felt weak, but is able to perform with good stability with minimal support from PT. She required frequent seated rest breaks throughout session due to fatigue. No reports of back pain throughout session.   EVAL: Patient is a 73 y.o. female who was seen today for physical therapy evaluation and treatment for other abnormalities of gait and mobility. Patient reports at least 1 year history of gait abnormalities and lower extremity weakness. She does note onset of low back pain around the same time she began to notice leg  weakness and gait abnormalities. Upon assessment she is noted to have significant bilateral hip weakness and mild quadriceps weakness. She has gait abnormalities with limited foot clearance and decreased step length. She scores at an increased fall risk based upon TUG and BERG balance assessment. She will benefit from skilled PT to address the above stated deficits in order to optimize her function and reduce her risk of future falls.   OBJECTIVE IMPAIRMENTS: Abnormal gait, decreased activity tolerance, decreased balance, decreased endurance, decreased knowledge of condition,  difficulty walking, decreased strength, postural dysfunction, and pain.   ACTIVITY LIMITATIONS: carrying, lifting, bending, standing, squatting, stairs, transfers, and locomotion level  PARTICIPATION LIMITATIONS: meal prep, cleaning, laundry, shopping, community activity, and yard work  PERSONAL FACTORS: Age, Fitness, Time since onset of injury/illness/exacerbation, and 3+ comorbidities: see PMH above are also affecting patient's functional outcome.   REHAB POTENTIAL: Good  CLINICAL DECISION MAKING: Evolving/moderate complexity  EVALUATION COMPLEXITY: Moderate   GOALS: Goals reviewed with patient? Yes  SHORT TERM GOALS: Target date: 07/13/2024  Patient will be independent and compliant with initial HEP.  Baseline: initial HEP issued Goal status: INITIAL  2.  Patient will complete TUG in </= 12 seconds to reduce fall risk.  Baseline: see above Goal status: INITIAL  3.  Patient will demonstrate at least 4/5 bilateral hip flexor and 3+/5 hip extensor strength to improve gait stability.   Baseline: see above  Goal status: INITIAL   LONG TERM GOALS: Target date: 08/12/24  Patient will be independent with floor transfer.  Baseline: requires assistance from husband  Goal status: INITIAL  2.  Patient will be able to reach into bottom cabinets at home to pick up items.  Baseline: unable, requires husband to reach into bottom cabinets at home.  Goal status: INITIAL  3.  Patient will score >/= 45/56 on the BERG to reduce risk of falls.  Baseline: see above Goal status: INITIAL  4.  Patient will score at least 50% confidence on the ABC balance scale.  Baseline: see above Goal status: INITIAL  5.  Patient will be independent with advanced home program to progress/maintain current level of function.  Baseline: initial HEP issued  Goal status: INITIAL   PLAN:  PT FREQUENCY: 2x/week  PT DURATION: 8 weeks  PLANNED INTERVENTIONS: 97164- PT Re-evaluation, 97750- Physical  Performance Testing, 97110-Therapeutic exercises, 97530- Therapeutic activity, [id number]- Neuromuscular re-education, 97535- Self Care, [id number]- Manual therapy, [id number]- Gait training, [phone] (1-2 muscles), [id number] (3+ muscles)- Dry Needling, Cryotherapy, and Moist heat  PLAN FOR NEXT SESSION: progress hip strengthening. Static & dynamic balance activities. Sit to stands. Gait training working on increasing step length,reducing BOS, and improved clearance; curb navigation. Good response to lumbar flexion stretch.  Laverne Hursey, PT, DPT, ATC 07/04/24 12:32 PM

## 2024-07-06 ENCOUNTER — Ambulatory Visit

## 2024-07-11 ENCOUNTER — Ambulatory Visit

## 2024-07-11 DIAGNOSIS — R2689 Other abnormalities of gait and mobility: Secondary | ICD-10-CM

## 2024-07-11 DIAGNOSIS — M6281 Muscle weakness (generalized): Secondary | ICD-10-CM

## 2024-07-11 DIAGNOSIS — Z9181 History of falling: Secondary | ICD-10-CM

## 2024-07-11 NOTE — Therapy (Signed)
 OUTPATIENT PHYSICAL THERAPY LOWER EXTREMITY TREATMENT   Patient Name: Jane [name] MRN: [id number] DOB:06/07/1951, 73 y.o., female Today's Date: 07/11/2024  END OF SESSION:  PT End of Session - 07/11/24 0932     Visit Number 7    Number of Visits 17    Date for PT Re-Evaluation 08/12/24    Authorization Type [employer]    Progress Note Due on Visit 10    PT Start Time 0932    PT Stop Time 1012    PT Time Calculation (min) 40 min    Activity Tolerance Patient tolerated treatment well    Behavior During Therapy WFL for tasks assessed/performed         Past Medical History:  Diagnosis Date   Arthritis    Cancer (HCC)    skin nose   GERD (gastroesophageal reflux disease)    Heart murmur    when pregnant   Hepatitis    75  from food   History of recurrent UTIs    on  prophalatic med   Hypertension    Pneumonia    hx   Past Surgical History:  Procedure Laterality Date   CESAREAN SECTION     COLON SURGERY     resection for diverticulitis   TOTAL HIP ARTHROPLASTY     TOTAL HIP ARTHROPLASTY Left 07/17/2014   Procedure: LEFT TOTAL HIP ARTHROPLASTY ANTERIOR APPROACH;  Surgeon: Lonni CINDERELLA Poli, MD;  Location: [facility] OR;  Service: Orthopedics;  Laterality: Left;   Patient Active Problem List   Diagnosis Date Noted   Arthritis of left hip 07/17/2014   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/17/2014   GERD (gastroesophageal reflux disease)    HYPERTENSION 01/30/2010   DIVERTICULOSIS-COLON 01/30/2010   DIVERTICULITIS OF COLON 01/27/2010   CHANGE IN BOWELS 01/21/2010   ABDOMINAL PAIN-LLQ 01/21/2010   ABDOMINAL PAIN, LOWER 01/21/2010   UTI'S, HX OF 01/21/2010   HYPERLIPIDEMIA 10/28/2007   GERD 10/28/2007   HIATAL HERNIA 10/28/2007   IRRITABLE BOWEL SYNDROME 10/28/2007   OSTEOARTHRITIS 10/28/2007   OSTEOARTHRITIS 10/28/2007   ARTHRITIS 10/28/2007   History of colonic polyps 10/28/2007    PCP: Morgan Drivers, MD  REFERRING PROVIDER: Katina Pfeiffer, PA-C   REFERRING DIAG: R26.89 (ICD-10-CM) - Other abnormalities of gait and mobility   THERAPY DIAG:  Muscle weakness (generalized)  Other abnormalities of gait and mobility  History of falling  Rationale for Evaluation and Treatment: Rehabilitation  ONSET DATE: July 2024   SUBJECTIVE:   SUBJECTIVE STATEMENT: Patient reports she has been more mindful of walking without shuffling; states her back is sore but no pain. Patient states she is feeling more steady navigating curbs.  PERTINENT HISTORY: THA- bilateral  PAIN:  Are you having pain? No  PRECAUTIONS: Fall  RED FLAGS: None   WEIGHT BEARING RESTRICTIONS: No  FALLS:  Has patient fallen in last 6 months? Yes. Number of falls 1, patient reports she had LOB and fell in the living room  LIVING ENVIRONMENT: Lives with: lives with their spouse Lives in: House/apartment Stairs: No Has following equipment at home: Single point cane and Environmental consultant - 4 wheeled  OCCUPATION: retired  PLOF: Independent  PATIENT GOALS: I want to be able to have more strength in my legs and walk normal.  NEXT MD VISIT: September 2025   OBJECTIVE:  Note: Objective measures were completed at Evaluation unless otherwise noted.  PATIENT SURVEYS:  ABC scale: The Activities-Specific Balance Confidence (ABC) Scale 0% 10 20 30  40 50  60 70 80 90 100% No confidence<->completely confident  "How confident are you that you will not lose your balance or become unsteady when you . . .   Date tested 06/15/24  Walk around the house 100%  2. Walk up or down stairs 0%  3. Bend over and pick up a slipper from in front of a closet floor 10%  4. Reach for a small can off a shelf at eye level 100%  5. Stand on tip toes and reach for something above your head 10%  6. Stand on a chair and reach for something 0%  7. Sweep the floor 50%  8. Walk outside the house to a car parked in the driveway 100%  9. Get into or out of a car 100%  10. Walk across  a parking lot to the mall 20%  11. Walk up or down a ramp 20%  12. Walk in a crowded mall where people rapidly walk past you 100%  13. Are bumped into by people as you walk through the mall 0%  14. Step onto or off of an escalator while you are holding onto the railing 0%  15. Step onto or off an escalator while holding onto parcels such that you cannot hold onto the railing 0%  16. Walk outside on icy sidewalks 0%  Total: #/16  38 %     COGNITION: Overall cognitive status: Within functional limits for tasks assessed     SENSATION: Not tested    POSTURE: rounded shoulders and forward head   LOWER EXTREMITY MMT:  MMT Right eval Left eval 07/04/24  Hip flexion 3+ 3+ Rt: 3+; Lt: 4-  Hip extension 3- 3-   Hip abduction 3+ 3+   Hip adduction     Hip internal rotation     Hip external rotation     Knee flexion 5 5   Knee extension 4- 4- 4+ bilateral  Ankle dorsiflexion 5 5   Ankle plantarflexion 5 seated 5 seated   Ankle inversion 5 5   Ankle eversion 5 5    (Blank rows = not tested)   FUNCTIONAL TESTS:  TUG: 15 seconds    BERG BALANCE TEST Sitting to Standing: 3.      Stands independently using hands Standing Unsupported: 4.      Stands safely for 2 minutes Sitting Unsupported: 4.     Sits for 2 minutes independently Standing to Sitting: 4.     Sits safely with minimal use of hands Transfers: 3.     Transfers safely definite use of hands Standing with eyes closed: 3.     Stands 10 seconds with supervision Standing with feet together: 3.     Stands for 1 minute with supervision Reaching forward with outstretched arm: 3.     Reaches forward 5 inches Retrieving object from the floor: 2.     Unable to pick up, but reaches within 1-2 inches independently Turning to look behind: 4.     Looks behind from both sides and weight shifts well Turning 360 degrees: 2.     Able to turn slowly, but safely Place alternate foot on stool: 1.     Completes >2 steps with minimal  assist Standing with one foot in front: 2.     Independent small step for 30 seconds Standing on one foot: 1.     Holds <3 seconds  Total Score: 36/56  GAIT: Distance walked: 20 ft  Assistive device utilized: None Level of  assistance: Complete Independence Comments: limited foot clearance, small step length (not a true shuffling pattern, but more like a small march)   [facility] Adult PT Treatment:                                                DATE: 07/11/2024 Therapeutic Exercise: Seated trunk flexion stretch Standing heel raises + tennis ball b/w heels Standing gastroc stretch NuStep interval training L4 <--> L6 30  intervals x 4 min --> L3-2 x 1 min cool down Therapeutic Activity: Cone weaving Cone slide to side --> SPC Gait: Walking with focus on equal stride length   [facility] Adult PT Treatment:                                                DATE: 07/04/24 Therapeutic Exercise: NuStep level 4 x 5 minutes UE/LE LAQ 2 x 10; 1 lbs  Heel raises 2 x 10  Calf raises x 10  Gait training: Pre-gait: forward step overs x 10 each with single UE support working on heel strike Gait training in [facility] hallway and outside on sidewalk focusing on narrowing base and allowing for heel strike and push-off  Therapeutic Activity: Curb negotiation requires HHA   [facility] Adult PT Treatment:                                                DATE: 06/29/2024 Therapeutic Exercise: Seated forward flexion stretch with red PB roll out x10 LTR x 2 min Seated marching + red TB crossed at forefoot 2x10 Standing hip extension + 2#AW x 12 (bil) Standing marching + 2#AW x 20 Therapeutic Activity: Sit to stand x 5 Standing squats --> seat tap on 2 pillows x 10 --> seat tap on 1 pillow x 5 Standing marching + 2#AW Alt foot taps on edge of treadmill + 2#AW no HHA (SBA) x 10  Curb step up/down + 1 railing --> step up/down with 1 railing & SPC Gait: Gait training with [facility]                                                                                                                            PATIENT EDUCATION:  Education details: HEP review  Person educated: Patient Education method: Explanation Education comprehension: verbalized understanding  HOME EXERCISE PROGRAM: Access Code: [email or web address] URL: [email or web address] Date: 06/29/2024 Prepared by: Lamarr Price  Exercises - Romberg Stance  - 2 x daily - 7 x weekly - 3 sets - 30 sec  hold - Seated Long Arc Quad  - 2 x  daily - 7 x weekly - 2 sets - 10 reps - Seated March with Resistance  - 2 x daily - 7 x weekly - 2 sets - 10 reps - Seated Hip Abduction with Resistance  - 2 x daily - 7 x weekly - 2 sets - 10 reps - Clamshell  - 2 x daily - 7 x weekly - 2 sets - 10 reps - Heel Raises with Counter Support  - 2 x daily - 7 x weekly - 2 sets - 10 reps - Toe Raises with Counter Support  - 2 x daily - 7 x weekly - 2 sets - 10 reps - Sit to Stand  - 1 x daily - 7 x weekly - 3 sets - 10 reps - Seated Hip Flexor Stretch  - 1 x daily - 7 x weekly - 1 sets - 3 reps - 30 sec hold - Standing Romberg to 3/4 Tandem Stance  - 1 x daily - 7 x weekly - 1 sets - 3 reps - 30 sec hold - Mini Squat  - 1 x daily - 7 x weekly - 3 sets - 10 reps - Supine Lower Trunk Rotation  - 1 x daily - 7 x weekly - 3 sets - 10 reps - Seated Flexion Stretch  - 1 x daily - 7 x weekly - 3 sets - 10 reps  ASSESSMENT:  CLINICAL IMPRESSION: Dynamic balance challenged with cone weaving; noted shuffling gait with directional changes during weaving. Single leg cone slides performed with SPC due to moderate instability. Noted improvement in endurance and strength tolerance of LE with standing exercises. Moderate fatigue reported after interval training on NuStep.  EVAL: Patient is a 73 y.o. female who was seen today for physical therapy evaluation and treatment for other abnormalities of gait and mobility. Patient reports at least 1 year history of gait abnormalities and lower  extremity weakness. She does note onset of low back pain around the same time she began to notice leg weakness and gait abnormalities. Upon assessment she is noted to have significant bilateral hip weakness and mild quadriceps weakness. She has gait abnormalities with limited foot clearance and decreased step length. She scores at an increased fall risk based upon TUG and BERG balance assessment. She will benefit from skilled PT to address the above stated deficits in order to optimize her function and reduce her risk of future falls.   OBJECTIVE IMPAIRMENTS: Abnormal gait, decreased activity tolerance, decreased balance, decreased endurance, decreased knowledge of condition, difficulty walking, decreased strength, postural dysfunction, and pain.   ACTIVITY LIMITATIONS: carrying, lifting, bending, standing, squatting, stairs, transfers, and locomotion level  PARTICIPATION LIMITATIONS: meal prep, cleaning, laundry, shopping, community activity, and yard work  PERSONAL FACTORS: Age, Fitness, Time since onset of injury/illness/exacerbation, and 3+ comorbidities: see PMH above are also affecting patient's functional outcome.   REHAB POTENTIAL: Good  CLINICAL DECISION MAKING: Evolving/moderate complexity  EVALUATION COMPLEXITY: Moderate   GOALS: Goals reviewed with patient? Yes  SHORT TERM GOALS: Target date: 07/13/2024  Patient will be independent and compliant with initial HEP.  Baseline: initial HEP issued Goal status: INITIAL  2.  Patient will complete TUG in </= 12 seconds to reduce fall risk.  Baseline: see above Goal status: INITIAL  3.  Patient will demonstrate at least 4/5 bilateral hip flexor and 3+/5 hip extensor strength to improve gait stability.   Baseline: see above  Goal status: INITIAL   LONG TERM GOALS: Target date: 08/12/24  Patient will be  independent with floor transfer.  Baseline: requires assistance from husband  Goal status: INITIAL  2.  Patient will be able  to reach into bottom cabinets at home to pick up items.  Baseline: unable, requires husband to reach into bottom cabinets at home.  Goal status: INITIAL  3.  Patient will score >/= 45/56 on the BERG to reduce risk of falls.  Baseline: see above Goal status: INITIAL  4.  Patient will score at least 50% confidence on the ABC balance scale.  Baseline: see above Goal status: INITIAL  5.  Patient will be independent with advanced home program to progress/maintain current level of function.  Baseline: initial HEP issued  Goal status: INITIAL   PLAN:  PT FREQUENCY: 2x/week  PT DURATION: 8 weeks  PLANNED INTERVENTIONS: [id number]- PT Re-evaluation, 97750- Physical Performance Testing, 97110-Therapeutic exercises, 97530- Therapeutic activity, [id number]- Neuromuscular re-education, 97535- Self Care, [id number]- Manual therapy, [id number]- Gait training, [phone] (1-2 muscles), [id number] (3+ muscles)- Dry Needling, Cryotherapy, and Moist heat  PLAN FOR NEXT SESSION: progress hip strengthening. Static & dynamic balance activities. Sit to stands. Gait training working on increasing step length, reducing BOS, and improved clearance; curb navigation. Good response to lumbar flexion stretch.  Lamarr Price, PTA 07/11/24 10:30 AM

## 2024-07-13 ENCOUNTER — Ambulatory Visit

## 2024-07-13 DIAGNOSIS — M6281 Muscle weakness (generalized): Secondary | ICD-10-CM | POA: Diagnosis not present

## 2024-07-13 DIAGNOSIS — R2689 Other abnormalities of gait and mobility: Secondary | ICD-10-CM

## 2024-07-13 DIAGNOSIS — Z9181 History of falling: Secondary | ICD-10-CM

## 2024-07-13 NOTE — Therapy (Signed)
 OUTPATIENT PHYSICAL THERAPY LOWER EXTREMITY TREATMENT   Patient Name: Jane [name] MRN: [id number] DOB:10/19/1951, 73 y.o., female Today's Date: 07/13/2024  END OF SESSION:  PT End of Session - 07/13/24 0934     Visit Number 8    Number of Visits 17    Date for PT Re-Evaluation 08/12/24    Authorization Type [employer]    Progress Note Due on Visit 10    PT Start Time 0932    PT Stop Time 1015    PT Time Calculation (min) 43 min    Activity Tolerance Patient tolerated treatment well    Behavior During Therapy WFL for tasks assessed/performed         Past Medical History:  Diagnosis Date   Arthritis    Cancer (HCC)    skin nose   GERD (gastroesophageal reflux disease)    Heart murmur    when pregnant   Hepatitis    75  from food   History of recurrent UTIs    on  prophalatic med   Hypertension    Pneumonia    hx   Past Surgical History:  Procedure Laterality Date   CESAREAN SECTION     COLON SURGERY     resection for diverticulitis   TOTAL HIP ARTHROPLASTY     TOTAL HIP ARTHROPLASTY Left 07/17/2014   Procedure: LEFT TOTAL HIP ARTHROPLASTY ANTERIOR APPROACH;  Surgeon: Lonni CINDERELLA Poli, MD;  Location: [facility] OR;  Service: Orthopedics;  Laterality: Left;   Patient Active Problem List   Diagnosis Date Noted   Arthritis of left hip 07/17/2014   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/17/2014   GERD (gastroesophageal reflux disease)    HYPERTENSION 01/30/2010   DIVERTICULOSIS-COLON 01/30/2010   DIVERTICULITIS OF COLON 01/27/2010   CHANGE IN BOWELS 01/21/2010   ABDOMINAL PAIN-LLQ 01/21/2010   ABDOMINAL PAIN, LOWER 01/21/2010   UTI'S, HX OF 01/21/2010   HYPERLIPIDEMIA 10/28/2007   GERD 10/28/2007   HIATAL HERNIA 10/28/2007   IRRITABLE BOWEL SYNDROME 10/28/2007   OSTEOARTHRITIS 10/28/2007   OSTEOARTHRITIS 10/28/2007   ARTHRITIS 10/28/2007   History of colonic polyps 10/28/2007    PCP: Morgan Drivers, MD  REFERRING PROVIDER: Katina Pfeiffer, PA-C   REFERRING DIAG: R26.89 (ICD-10-CM) - Other abnormalities of gait and mobility   THERAPY DIAG:  Muscle weakness (generalized)  Other abnormalities of gait and mobility  History of falling  Rationale for Evaluation and Treatment: Rehabilitation  ONSET DATE: July 2024   SUBJECTIVE:   SUBJECTIVE STATEMENT: Patient reports she was able to step up/down curbs yesterday with more confidence. Patient states she has difficulty picking objects up from the floor due to unsteadiness.   PERTINENT HISTORY: THA- bilateral  PAIN:  Are you having pain? No  PRECAUTIONS: Fall  RED FLAGS: None   WEIGHT BEARING RESTRICTIONS: No  FALLS:  Has patient fallen in last 6 months? Yes. Number of falls 1, patient reports she had LOB and fell in the living room  LIVING ENVIRONMENT: Lives with: lives with their spouse Lives in: House/apartment Stairs: No Has following equipment at home: Single point cane and Environmental consultant - 4 wheeled  OCCUPATION: retired  PLOF: Independent  PATIENT GOALS: I want to be able to have more strength in my legs and walk normal.  NEXT MD VISIT: September 2025   OBJECTIVE:  Note: Objective measures were completed at Evaluation unless otherwise noted.  PATIENT SURVEYS:  ABC scale: The Activities-Specific Balance Confidence (ABC) Scale 0% 10 20 30  40 50  60 70 80 90 100% No confidence<->completely confident  "How confident are you that you will not lose your balance or become unsteady when you . . .   Date tested 06/15/24  Walk around the house 100%  2. Walk up or down stairs 0%  3. Bend over and pick up a slipper from in front of a closet floor 10%  4. Reach for a small can off a shelf at eye level 100%  5. Stand on tip toes and reach for something above your head 10%  6. Stand on a chair and reach for something 0%  7. Sweep the floor 50%  8. Walk outside the house to a car parked in the driveway 100%  9. Get into or out of a car 100%  10. Walk  across a parking lot to the mall 20%  11. Walk up or down a ramp 20%  12. Walk in a crowded mall where people rapidly walk past you 100%  13. Are bumped into by people as you walk through the mall 0%  14. Step onto or off of an escalator while you are holding onto the railing 0%  15. Step onto or off an escalator while holding onto parcels such that you cannot hold onto the railing 0%  16. Walk outside on icy sidewalks 0%  Total: #/16  38 %     COGNITION: Overall cognitive status: Within functional limits for tasks assessed     SENSATION: Not tested    POSTURE: rounded shoulders and forward head   LOWER EXTREMITY MMT:  MMT Right eval Left eval 07/04/24  Hip flexion 3+ 3+ Rt: 3+; Lt: 4-  Hip extension 3- 3-   Hip abduction 3+ 3+   Hip adduction     Hip internal rotation     Hip external rotation     Knee flexion 5 5   Knee extension 4- 4- 4+ bilateral  Ankle dorsiflexion 5 5   Ankle plantarflexion 5 seated 5 seated   Ankle inversion 5 5   Ankle eversion 5 5    (Blank rows = not tested)   FUNCTIONAL TESTS:  TUG: 15 seconds    BERG BALANCE TEST Sitting to Standing: 3.      Stands independently using hands Standing Unsupported: 4.      Stands safely for 2 minutes Sitting Unsupported: 4.     Sits for 2 minutes independently Standing to Sitting: 4.     Sits safely with minimal use of hands Transfers: 3.     Transfers safely definite use of hands Standing with eyes closed: 3.     Stands 10 seconds with supervision Standing with feet together: 3.     Stands for 1 minute with supervision Reaching forward with outstretched arm: 3.     Reaches forward 5 inches Retrieving object from the floor: 2.     Unable to pick up, but reaches within 1-2 inches independently Turning to look behind: 4.     Looks behind from both sides and weight shifts well Turning 360 degrees: 2.     Able to turn slowly, but safely Place alternate foot on stool: 1.     Completes >2 steps with minimal  assist Standing with one foot in front: 2.     Independent small step for 30 seconds Standing on one foot: 1.     Holds <3 seconds  Total Score: 36/56  GAIT: Distance walked: 20 ft  Assistive device utilized: None Level of  assistance: Complete Independence Comments: limited foot clearance, small step length (not a true shuffling pattern, but more like a small march)   [facility] Adult PT Treatment:                                                DATE: 07/13/2024 Therapeutic Exercise: NuStep L5 x 5 min  Standing heel raises + tennis ball b/w heels 2x10 Standing hip abduction & extension x10 each (bil) Seated forward flexion stretch Neuromuscular re-ed: Rhomberg stance balance + head turns/nods Staggered stance + head turns/nods Therapeutic Activity: Cone pick-up  Squats + seat tap on pillow with arms reaching forward 2x10 Wall push-ups 2x10 Walking with gait speed prompts    [facility] Adult PT Treatment:                                                DATE: 07/11/2024 Therapeutic Exercise: Seated trunk flexion stretch Standing heel raises + tennis ball b/w heels Standing gastroc stretch NuStep interval training L4 <--> L6 30  intervals x 4 min --> L3-2 x 1 min cool down Therapeutic Activity: Cone weaving Cone slide to side --> SPC Gait: Walking with focus on equal stride length   [facility] Adult PT Treatment:                                                DATE: 07/04/24 Therapeutic Exercise: NuStep level 4 x 5 minutes UE/LE LAQ 2 x 10; 1 lbs  Heel raises 2 x 10  Calf raises x 10  Gait training: Pre-gait: forward step overs x 10 each with single UE support working on heel strike Gait training in [facility] hallway and outside on sidewalk focusing on narrowing base and allowing for heel strike and push-off  Therapeutic Activity: Curb negotiation requires HHA                                                                                                                            PATIENT EDUCATION:  Education details: HEP review  Person educated: Patient Education method: Explanation Education comprehension: verbalized understanding  HOME EXERCISE PROGRAM: Access Code: [email or web address] URL: [email or web address] Date: 06/29/2024 Prepared by: Lamarr Price  Exercises - Romberg Stance  - 2 x daily - 7 x weekly - 3 sets - 30 sec  hold - Seated Long Arc Quad  - 2 x daily - 7 x weekly - 2 sets - 10 reps - Seated March with Resistance  - 2 x daily - 7 x weekly - 2 sets - 10 reps - Seated Hip Abduction  with Resistance  - 2 x daily - 7 x weekly - 2 sets - 10 reps - Clamshell  - 2 x daily - 7 x weekly - 2 sets - 10 reps - Heel Raises with Counter Support  - 2 x daily - 7 x weekly - 2 sets - 10 reps - Toe Raises with Counter Support  - 2 x daily - 7 x weekly - 2 sets - 10 reps - Sit to Stand  - 1 x daily - 7 x weekly - 3 sets - 10 reps - Seated Hip Flexor Stretch  - 1 x daily - 7 x weekly - 1 sets - 3 reps - 30 sec hold - Standing Romberg to 3/4 Tandem Stance  - 1 x daily - 7 x weekly - 1 sets - 3 reps - 30 sec hold - Mini Squat  - 1 x daily - 7 x weekly - 3 sets - 10 reps - Supine Lower Trunk Rotation  - 1 x daily - 7 x weekly - 3 sets - 10 reps - Seated Flexion Stretch  - 1 x daily - 7 x weekly - 3 sets - 10 reps  ASSESSMENT:  CLINICAL IMPRESSION: Wall push-ups added to progress patient towards independence with floor transfers. Noted increased strengthen in LE with functional squats and bending down to pick up objects from floor. Initial moderate postural sway with added head turns in narrow stance with balance activities, however stability improved over time.  EVAL: Patient is a 73 y.o. female who was seen today for physical therapy evaluation and treatment for other abnormalities of gait and mobility. Patient reports at least 1 year history of gait abnormalities and lower extremity weakness. She does note onset of low back pain around the same time she  began to notice leg weakness and gait abnormalities. Upon assessment she is noted to have significant bilateral hip weakness and mild quadriceps weakness. She has gait abnormalities with limited foot clearance and decreased step length. She scores at an increased fall risk based upon TUG and BERG balance assessment. She will benefit from skilled PT to address the above stated deficits in order to optimize her function and reduce her risk of future falls.   OBJECTIVE IMPAIRMENTS: Abnormal gait, decreased activity tolerance, decreased balance, decreased endurance, decreased knowledge of condition, difficulty walking, decreased strength, postural dysfunction, and pain.   ACTIVITY LIMITATIONS: carrying, lifting, bending, standing, squatting, stairs, transfers, and locomotion level  PARTICIPATION LIMITATIONS: meal prep, cleaning, laundry, shopping, community activity, and yard work  PERSONAL FACTORS: Age, Fitness, Time since onset of injury/illness/exacerbation, and 3+ comorbidities: see PMH above are also affecting patient's functional outcome.   REHAB POTENTIAL: Good  CLINICAL DECISION MAKING: Evolving/moderate complexity  EVALUATION COMPLEXITY: Moderate   GOALS: Goals reviewed with patient? Yes  SHORT TERM GOALS: Target date: 07/13/2024  Patient will be independent and compliant with initial HEP.  Baseline: initial HEP issued Goal status: INITIAL  2.  Patient will complete TUG in </= 12 seconds to reduce fall risk.  Baseline: see above Goal status: INITIAL  3.  Patient will demonstrate at least 4/5 bilateral hip flexor and 3+/5 hip extensor strength to improve gait stability.   Baseline: see above  Goal status: INITIAL   LONG TERM GOALS: Target date: 08/12/24  Patient will be independent with floor transfer.  Baseline: requires assistance from husband  Goal status: INITIAL  2.  Patient will be able to reach into bottom cabinets at home to pick up items.  Baseline: unable,  requires husband to reach into bottom cabinets at home.  Goal status: INITIAL  3.  Patient will score >/= 45/56 on the BERG to reduce risk of falls.  Baseline: see above Goal status: INITIAL  4.  Patient will score at least 50% confidence on the ABC balance scale.  Baseline: see above Goal status: INITIAL  5.  Patient will be independent with advanced home program to progress/maintain current level of function.  Baseline: initial HEP issued  Goal status: INITIAL   PLAN:  PT FREQUENCY: 2x/week  PT DURATION: 8 weeks  PLANNED INTERVENTIONS: 97164- PT Re-evaluation, 97750- Physical Performance Testing, 97110-Therapeutic exercises, 97530- Therapeutic activity, [id number]- Neuromuscular re-education, 97535- Self Care, [id number]- Manual therapy, [id number]- Gait training, [phone] (1-2 muscles), [id number] (3+ muscles)- Dry Needling, Cryotherapy, and Moist heat  PLAN FOR NEXT SESSION: progress hip strengthening. Static & dynamic balance activities. Sit to stands. Gait training working on increasing step length, reducing BOS, and improved clearance; curb navigation. Good response to lumbar flexion stretch.  Lamarr Price, PTA 07/13/24 10:16 AM

## 2024-07-18 ENCOUNTER — Ambulatory Visit

## 2024-07-18 DIAGNOSIS — Z9181 History of falling: Secondary | ICD-10-CM

## 2024-07-18 DIAGNOSIS — R2689 Other abnormalities of gait and mobility: Secondary | ICD-10-CM

## 2024-07-18 DIAGNOSIS — M6281 Muscle weakness (generalized): Secondary | ICD-10-CM | POA: Diagnosis not present

## 2024-07-18 NOTE — Therapy (Signed)
 OUTPATIENT PHYSICAL THERAPY LOWER EXTREMITY TREATMENT   Patient Name: Jane [name] MRN: [id number] DOB:Mar 28, 1951, 73 y.o., female Today's Date: 07/18/2024  END OF SESSION:  PT End of Session - 07/18/24 0930     Visit Number 9    Number of Visits 17    Date for PT Re-Evaluation 08/12/24    Authorization Type [employer]    Progress Note Due on Visit 10    PT Start Time 0930    PT Stop Time 1010    PT Time Calculation (min) 40 min    Equipment Utilized During Treatment Gait belt    Activity Tolerance Patient tolerated treatment well    Behavior During Therapy WFL for tasks assessed/performed          Past Medical History:  Diagnosis Date   Arthritis    Cancer (HCC)    skin nose   GERD (gastroesophageal reflux disease)    Heart murmur    when pregnant   Hepatitis    75  from food   History of recurrent UTIs    on  prophalatic med   Hypertension    Pneumonia    hx   Past Surgical History:  Procedure Laterality Date   CESAREAN SECTION     COLON SURGERY     resection for diverticulitis   TOTAL HIP ARTHROPLASTY     TOTAL HIP ARTHROPLASTY Left 07/17/2014   Procedure: LEFT TOTAL HIP ARTHROPLASTY ANTERIOR APPROACH;  Surgeon: Lonni CINDERELLA Poli, MD;  Location: [facility] OR;  Service: Orthopedics;  Laterality: Left;   Patient Active Problem List   Diagnosis Date Noted   Arthritis of left hip 07/17/2014   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/17/2014   GERD (gastroesophageal reflux disease)    HYPERTENSION 01/30/2010   DIVERTICULOSIS-COLON 01/30/2010   DIVERTICULITIS OF COLON 01/27/2010   CHANGE IN BOWELS 01/21/2010   ABDOMINAL PAIN-LLQ 01/21/2010   ABDOMINAL PAIN, LOWER 01/21/2010   UTI'S, HX OF 01/21/2010   HYPERLIPIDEMIA 10/28/2007   GERD 10/28/2007   HIATAL HERNIA 10/28/2007   IRRITABLE BOWEL SYNDROME 10/28/2007   OSTEOARTHRITIS 10/28/2007   OSTEOARTHRITIS 10/28/2007   ARTHRITIS 10/28/2007   History of colonic polyps 10/28/2007    PCP:  Morgan Drivers, MD  REFERRING PROVIDER: Katina Pfeiffer, PA-C   REFERRING DIAG: R26.89 (ICD-10-CM) - Other abnormalities of gait and mobility   THERAPY DIAG:  Muscle weakness (generalized)  Other abnormalities of gait and mobility  History of falling  Rationale for Evaluation and Treatment: Rehabilitation  ONSET DATE: July 2024   SUBJECTIVE:   SUBJECTIVE STATEMENT: Patient reports she feels like she is walking better. Balance still feels off and her back will still hurt if she is standing for too long.   PERTINENT HISTORY: THA- bilateral  PAIN:  Are you having pain? No  PRECAUTIONS: Fall  RED FLAGS: None   WEIGHT BEARING RESTRICTIONS: No  FALLS:  Has patient fallen in last 6 months? Yes. Number of falls 1, patient reports she had LOB and fell in the living room  LIVING ENVIRONMENT: Lives with: lives with their spouse Lives in: House/apartment Stairs: No Has following equipment at home: Single point cane and Environmental consultant - 4 wheeled  OCCUPATION: retired  PLOF: Independent  PATIENT GOALS: I want to be able to have more strength in my legs and walk normal.  NEXT MD VISIT: September 2025   OBJECTIVE:  Note: Objective measures were completed at Evaluation unless otherwise noted.  PATIENT SURVEYS:  ABC scale: The Activities-Specific Balance  Confidence (ABC) Scale 0% 10 20 30  40 50 60 70 80 90 100% No confidence<->completely confident  "How confident are you that you will not lose your balance or become unsteady when you . . .   Date tested 06/15/24  Walk around the house 100%  2. Walk up or down stairs 0%  3. Bend over and pick up a slipper from in front of a closet floor 10%  4. Reach for a small can off a shelf at eye level 100%  5. Stand on tip toes and reach for something above your head 10%  6. Stand on a chair and reach for something 0%  7. Sweep the floor 50%  8. Walk outside the house to a car parked in the driveway 100%  9. Get into or out of a car  100%  10. Walk across a parking lot to the mall 20%  11. Walk up or down a ramp 20%  12. Walk in a crowded mall where people rapidly walk past you 100%  13. Are bumped into by people as you walk through the mall 0%  14. Step onto or off of an escalator while you are holding onto the railing 0%  15. Step onto or off an escalator while holding onto parcels such that you cannot hold onto the railing 0%  16. Walk outside on icy sidewalks 0%  Total: #/16  38 %     COGNITION: Overall cognitive status: Within functional limits for tasks assessed     SENSATION: Not tested    POSTURE: rounded shoulders and forward head   LOWER EXTREMITY MMT:  MMT Right eval Left eval 07/04/24 07/18/24  Hip flexion 3+ 3+ Rt: 3+; Lt: 4- Lt: 4+; Rt: 4+  Hip extension 3- 3-  4 bilateral   Hip abduction 3+ 3+    Hip adduction      Hip internal rotation      Hip external rotation      Knee flexion 5 5    Knee extension 4- 4- 4+ bilateral   Ankle dorsiflexion 5 5    Ankle plantarflexion 5 seated 5 seated    Ankle inversion 5 5    Ankle eversion 5 5     (Blank rows = not tested)   FUNCTIONAL TESTS:  TUG: 15 seconds   07/18/24: 10.76 seconds    BERG BALANCE TEST Sitting to Standing: 3.      Stands independently using hands Standing Unsupported: 4.      Stands safely for 2 minutes Sitting Unsupported: 4.     Sits for 2 minutes independently Standing to Sitting: 4.     Sits safely with minimal use of hands Transfers: 3.     Transfers safely definite use of hands Standing with eyes closed: 3.     Stands 10 seconds with supervision Standing with feet together: 3.     Stands for 1 minute with supervision Reaching forward with outstretched arm: 3.     Reaches forward 5 inches Retrieving object from the floor: 2.     Unable to pick up, but reaches within 1-2 inches independently Turning to look behind: 4.     Looks behind from both sides and weight shifts well Turning 360 degrees: 2.     Able to turn  slowly, but safely Place alternate foot on stool: 1.     Completes >2 steps with minimal assist Standing with one foot in front: 2.     Independent small step  for 30 seconds Standing on one foot: 1.     Holds <3 seconds  Total Score: 36/56  GAIT: Distance walked: 20 ft  Assistive device utilized: None Level of assistance: Complete Independence Comments: limited foot clearance, small step length (not a true shuffling pattern, but more like a small march)  [facility] Adult PT Treatment:                                                DATE: 07/18/24 Therapeutic Exercise: Hip MMT  Seated HS curl blue band 2 x 10   Therapeutic Activity: TUG Curb negotiation and outdoor walking; gait belt Sit to stand from lower height; 5 lbs 2 x 10  Squats with UE support (working on increasing depth) x 10  Mini lunge with UE support x 10 each    [facility] Adult PT Treatment:                                                DATE: 07/13/2024 Therapeutic Exercise: NuStep L5 x 5 min  Standing heel raises + tennis ball b/w heels 2x10 Standing hip abduction & extension x10 each (bil) Seated forward flexion stretch Neuromuscular re-ed: Rhomberg stance balance + head turns/nods Staggered stance + head turns/nods Therapeutic Activity: Cone pick-up  Squats + seat tap on pillow with arms reaching forward 2x10 Wall push-ups 2x10 Walking with gait speed prompts    [facility] Adult PT Treatment:                                                DATE: 07/11/2024 Therapeutic Exercise: Seated trunk flexion stretch Standing heel raises + tennis ball b/w heels Standing gastroc stretch NuStep interval training L4 <--> L6 30  intervals x 4 min --> L3-2 x 1 min cool down Therapeutic Activity: Cone weaving Cone slide to side --> SPC Gait: Walking with focus on equal stride length   [facility] Adult PT Treatment:                                                DATE: 07/04/24 Therapeutic Exercise: NuStep level 4 x 5 minutes UE/LE LAQ 2  x 10; 1 lbs  Heel raises 2 x 10  Calf raises x 10  Gait training: Pre-gait: forward step overs x 10 each with single UE support working on heel strike Gait training in [facility] hallway and outside on sidewalk focusing on narrowing base and allowing for heel strike and push-off  Therapeutic Activity: Curb negotiation requires HHA  PATIENT EDUCATION:  Education details: HEP review  Person educated: Patient Education method: Explanation Education comprehension: verbalized understanding  HOME EXERCISE PROGRAM: Access Code: [email or web address] URL: [email or web address] Date: 06/29/2024 Prepared by: Lamarr Price  Exercises - Romberg Stance  - 2 x daily - 7 x weekly - 3 sets - 30 sec  hold - Seated Long Arc Quad  - 2 x daily - 7 x weekly - 2 sets - 10 reps - Seated March with Resistance  - 2 x daily - 7 x weekly - 2 sets - 10 reps - Seated Hip Abduction with Resistance  - 2 x daily - 7 x weekly - 2 sets - 10 reps - Clamshell  - 2 x daily - 7 x weekly - 2 sets - 10 reps - Heel Raises with Counter Support  - 2 x daily - 7 x weekly - 2 sets - 10 reps - Toe Raises with Counter Support  - 2 x daily - 7 x weekly - 2 sets - 10 reps - Sit to Stand  - 1 x daily - 7 x weekly - 3 sets - 10 reps - Seated Hip Flexor Stretch  - 1 x daily - 7 x weekly - 1 sets - 3 reps - 30 sec hold - Standing Romberg to 3/4 Tandem Stance  - 1 x daily - 7 x weekly - 1 sets - 3 reps - 30 sec hold - Mini Squat  - 1 x daily - 7 x weekly - 3 sets - 10 reps - Supine Lower Trunk Rotation  - 1 x daily - 7 x weekly - 3 sets - 10 reps - Seated Flexion Stretch  - 1 x daily - 7 x weekly - 3 sets - 10 reps  ASSESSMENT:  CLINICAL IMPRESSION: Patient is making good progress in PT having met all short term functional goals at this time. She is also able to independently complete curb negotiation without  any signs of LOB. Worked on squat progression, focusing on increasing depth to work towards long term goals of floor transfers and reaching into cabinets. Occasional soreness in the Rt hip noted with squatting activity.   EVAL: Patient is a 73 y.o. female who was seen today for physical therapy evaluation and treatment for other abnormalities of gait and mobility. Patient reports at least 1 year history of gait abnormalities and lower extremity weakness. She does note onset of low back pain around the same time she began to notice leg weakness and gait abnormalities. Upon assessment she is noted to have significant bilateral hip weakness and mild quadriceps weakness. She has gait abnormalities with limited foot clearance and decreased step length. She scores at an increased fall risk based upon TUG and BERG balance assessment. She will benefit from skilled PT to address the above stated deficits in order to optimize her function and reduce her risk of future falls.   OBJECTIVE IMPAIRMENTS: Abnormal gait, decreased activity tolerance, decreased balance, decreased endurance, decreased knowledge of condition, difficulty walking, decreased strength, postural dysfunction, and pain.   ACTIVITY LIMITATIONS: carrying, lifting, bending, standing, squatting, stairs, transfers, and locomotion level  PARTICIPATION LIMITATIONS: meal prep, cleaning, laundry, shopping, community activity, and yard work  PERSONAL FACTORS: Age, Fitness, Time since onset of injury/illness/exacerbation, and 3+ comorbidities: see PMH above are also affecting patient's functional outcome.   REHAB POTENTIAL: Good  CLINICAL DECISION MAKING: Evolving/moderate complexity  EVALUATION COMPLEXITY: Moderate   GOALS: Goals reviewed with patient? Yes  SHORT TERM GOALS: Target date: 07/13/2024  Patient will be independent and compliant with initial HEP.  Baseline: initial HEP issued Goal status: MET  2.  Patient will complete TUG in </=  12 seconds to reduce fall risk.  Baseline: see above Goal status: MET  3.  Patient will demonstrate at least 4/5 bilateral hip flexor and 3+/5 hip extensor strength to improve gait stability.   Baseline: see above  Goal status: MET   LONG TERM GOALS: Target date: 08/12/24  Patient will be independent with floor transfer.  Baseline: requires assistance from husband  Goal status: INITIAL  2.  Patient will be able to reach into bottom cabinets at home to pick up items.  Baseline: unable, requires husband to reach into bottom cabinets at home.  Goal status: INITIAL  3.  Patient will score >/= 45/56 on the BERG to reduce risk of falls.  Baseline: see above Goal status: INITIAL  4.  Patient will score at least 50% confidence on the ABC balance scale.  Baseline: see above Goal status: INITIAL  5.  Patient will be independent with advanced home program to progress/maintain current level of function.  Baseline: initial HEP issued  Goal status: INITIAL   PLAN:  PT FREQUENCY: 2x/week  PT DURATION: 8 weeks  PLANNED INTERVENTIONS: [id number]- PT Re-evaluation, 97750- Physical Performance Testing, 97110-Therapeutic exercises, 97530- Therapeutic activity, [id number]- Neuromuscular re-education, 97535- Self Care, [id number]- Manual therapy, [id number]- Gait training, [phone] (1-2 muscles), [id number] (3+ muscles)- Dry Needling, Cryotherapy, and Moist heat  PLAN FOR NEXT SESSION: progress hip strengthening. Static & dynamic balance activities. Sit to stands. Gait training working on increasing step length, reducing BOS, and improved clearance;  Good response to lumbar flexion stretch. PROGRESS NOTE   Melaina Howerton, PT, DPT, ATC 07/18/24 10:12 AM

## 2024-07-20 ENCOUNTER — Ambulatory Visit

## 2024-07-20 DIAGNOSIS — R2689 Other abnormalities of gait and mobility: Secondary | ICD-10-CM

## 2024-07-20 DIAGNOSIS — Z9181 History of falling: Secondary | ICD-10-CM

## 2024-07-20 DIAGNOSIS — M6281 Muscle weakness (generalized): Secondary | ICD-10-CM | POA: Diagnosis not present

## 2024-07-20 NOTE — Therapy (Signed)
 OUTPATIENT PHYSICAL THERAPY LOWER EXTREMITY TREATMENT Progress Note Reporting Period 06/15/2024 to 07/20/2024  See note below for Objective Data and Assessment of Progress/Goals.       Patient Name: Jane [name] MRN: [id number] DOB:1951-11-19, 74 y.o., female Today's Date: 07/20/2024  END OF SESSION:  PT End of Session - 07/20/24 1344     Visit Number 10    Number of Visits 17    Date for PT Re-Evaluation 08/12/24    Authorization Type [employer]    Progress Note Due on Visit 10    PT Start Time 1400    PT Stop Time 1445    PT Time Calculation (min) 45 min    Activity Tolerance Patient tolerated treatment well    Behavior During Therapy WFL for tasks assessed/performed          Past Medical History:  Diagnosis Date   Arthritis    Cancer (HCC)    skin nose   GERD (gastroesophageal reflux disease)    Heart murmur    when pregnant   Hepatitis    75  from food   History of recurrent UTIs    on  prophalatic med   Hypertension    Pneumonia    hx   Past Surgical History:  Procedure Laterality Date   CESAREAN SECTION     COLON SURGERY     resection for diverticulitis   TOTAL HIP ARTHROPLASTY     TOTAL HIP ARTHROPLASTY Left 07/17/2014   Procedure: LEFT TOTAL HIP ARTHROPLASTY ANTERIOR APPROACH;  Surgeon: Lonni CINDERELLA Poli, MD;  Location: [facility] OR;  Service: Orthopedics;  Laterality: Left;   Patient Active Problem List   Diagnosis Date Noted   Arthritis of left hip 07/17/2014   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/17/2014   GERD (gastroesophageal reflux disease)    HYPERTENSION 01/30/2010   DIVERTICULOSIS-COLON 01/30/2010   DIVERTICULITIS OF COLON 01/27/2010   CHANGE IN BOWELS 01/21/2010   ABDOMINAL PAIN-LLQ 01/21/2010   ABDOMINAL PAIN, LOWER 01/21/2010   UTI'S, HX OF 01/21/2010   HYPERLIPIDEMIA 10/28/2007   GERD 10/28/2007   HIATAL HERNIA 10/28/2007   IRRITABLE BOWEL SYNDROME 10/28/2007   OSTEOARTHRITIS 10/28/2007   OSTEOARTHRITIS  10/28/2007   ARTHRITIS 10/28/2007   History of colonic polyps 10/28/2007    PCP: Morgan Drivers, MD  REFERRING PROVIDER: Katina Pfeiffer, PA-C   REFERRING DIAG: R26.89 (ICD-10-CM) - Other abnormalities of gait and mobility   THERAPY DIAG:  Muscle weakness (generalized)  Other abnormalities of gait and mobility  History of falling  Rationale for Evaluation and Treatment: Rehabilitation  ONSET DATE: July 2024   SUBJECTIVE:   SUBJECTIVE STATEMENT: Patient reports she was very sore in both legs after last PT session. Patient states her back began to hurt when doing standing exercises but took a seated break and stretched low back and it alleviated pain.   PERTINENT HISTORY: THA- bilateral  PAIN:  Are you having pain? 5-6/10 bilateral thighs & hamstrings  PRECAUTIONS: Fall  RED FLAGS: None   WEIGHT BEARING RESTRICTIONS: No  FALLS:  Has patient fallen in last 6 months? Yes. Number of falls 1, patient reports she had LOB and fell in the living room  LIVING ENVIRONMENT: Lives with: lives with their spouse Lives in: House/apartment Stairs: No Has following equipment at home: Single point cane and Environmental consultant - 4 wheeled  OCCUPATION: retired  PLOF: Independent  PATIENT GOALS: I want to be able to have more strength in my legs and walk normal.  NEXT MD VISIT: September 2025   OBJECTIVE:  Note: Objective measures were completed at Evaluation unless otherwise noted.  PATIENT SURVEYS:  ABC scale: The Activities-Specific Balance Confidence (ABC) Scale 0% 10 20 30  40 50 60 70 80 90 100% No confidence<->completely confident  "How confident are you that you will not lose your balance or become unsteady when you . . .   Date tested 06/15/24  Walk around the house 100%  2. Walk up or down stairs 0%  3. Bend over and pick up a slipper from in front of a closet floor 10%  4. Reach for a small can off a shelf at eye level 100%  5. Stand on tip toes and reach for something  above your head 10%  6. Stand on a chair and reach for something 0%  7. Sweep the floor 50%  8. Walk outside the house to a car parked in the driveway 100%  9. Get into or out of a car 100%  10. Walk across a parking lot to the mall 20%  11. Walk up or down a ramp 20%  12. Walk in a crowded mall where people rapidly walk past you 100%  13. Are bumped into by people as you walk through the mall 0%  14. Step onto or off of an escalator while you are holding onto the railing 0%  15. Step onto or off an escalator while holding onto parcels such that you cannot hold onto the railing 0%  16. Walk outside on icy sidewalks 0%  Total: #/16  38 %     COGNITION: Overall cognitive status: Within functional limits for tasks assessed     SENSATION: Not tested    POSTURE: rounded shoulders and forward head   LOWER EXTREMITY MMT:  MMT Right eval Left eval 07/04/24 07/18/24  Hip flexion 3+ 3+ Rt: 3+; Lt: 4- Lt: 4+; Rt: 4+  Hip extension 3- 3-  4 bilateral   Hip abduction 3+ 3+    Hip adduction      Hip internal rotation      Hip external rotation      Knee flexion 5 5    Knee extension 4- 4- 4+ bilateral   Ankle dorsiflexion 5 5    Ankle plantarflexion 5 seated 5 seated    Ankle inversion 5 5    Ankle eversion 5 5     (Blank rows = not tested)   FUNCTIONAL TESTS:  TUG: 15 seconds   07/18/24: 10.76 seconds    BERG BALANCE TEST Sitting to Standing: 4.      Stands without using hands and stabilize independently Standing Unsupported: 4.      Stands safely for 2 minutes Sitting Unsupported: 4.     Sits for 2 minutes independently Standing to Sitting: 4.     Sits safely with minimal use of hands Transfers: 4.     Transfers safely with minor use of hands Standing with eyes closed: 4.     Stands safely for 10 seconds  Standing with feet together: 3.     Stands for 1 minute with supervision Reaching forward with outstretched arm: 3.     Reaches forward 5 inches Retrieving object from  the floor: 4.      Able to pick up easily and safely Turning to look behind: 4.     Looks behind from both sides and weight shifts well Turning 360 degrees: 2.     Able to turn slowly, but  safely Place alternate foot on stool: 3.     Completes 8 steps in >20 seconds Standing with one foot in front: 2.     Independent small step for 30 seconds Standing on one foot: 2.     Holds >/=3 seconds  Total Score: 36/56  GAIT: Distance walked: 20 ft  Assistive device utilized: None Level of assistance: Complete Independence Comments: limited foot clearance, small step length (not a true shuffling pattern, but more like a small march)   [facility] Adult PT Treatment:                                                DATE: 07/20/2024 Therapeutic Activity: ABC Berg Wall push-ups x10, x12 --> staggered hands x5 each side  Curb navigation + SPC --> no AD Reaching down to bottom cabinet to put objects on counter --> cones, 1#-3#DB, 5#KB 1/2 kneel down + UE support on table --> airex + therapad to kneel on    [facility] Adult PT Treatment:                                                DATE: 07/18/24 Therapeutic Exercise: Hip MMT  Seated HS curl blue band 2 x 10   Therapeutic Activity: TUG Curb negotiation and outdoor walking; gait belt Sit to stand from lower height; 5 lbs 2 x 10  Squats with UE support (working on increasing depth) x 10  Mini lunge with UE support x 10 each    [facility] Adult PT Treatment:                                                DATE: 07/13/2024 Therapeutic Exercise: NuStep L5 x 5 min  Standing heel raises + tennis ball b/w heels 2x10 Standing hip abduction & extension x10 each (bil) Seated forward flexion stretch Neuromuscular re-ed: Rhomberg stance balance + head turns/nods Staggered stance + head turns/nods Therapeutic Activity: Cone pick-up  Squats + seat tap on pillow with arms reaching forward 2x10 Wall push-ups 2x10 Walking with gait speed prompts    [facility] Adult PT  Treatment:                                                DATE: 07/11/2024 Therapeutic Exercise: Seated trunk flexion stretch Standing heel raises + tennis ball b/w heels Standing gastroc stretch NuStep interval training L4 <--> L6 30  intervals x 4 min --> L3-2 x 1 min cool down Therapeutic Activity: Cone weaving Cone slide to side --> SPC Gait: Walking with focus on equal stride length   [facility] Adult PT Treatment:                                                DATE: 07/04/24 Therapeutic Exercise: NuStep level 4 x 5 minutes UE/LE LAQ  2 x 10; 1 lbs  Heel raises 2 x 10  Calf raises x 10  Gait training: Pre-gait: forward step overs x 10 each with single UE support working on heel strike Gait training in [facility] hallway and outside on sidewalk focusing on narrowing base and allowing for heel strike and push-off  Therapeutic Activity: Curb negotiation requires HHA                                                                                                                           PATIENT EDUCATION:  Education details: HEP review  Person educated: Patient Education method: Explanation Education comprehension: verbalized understanding  HOME EXERCISE PROGRAM: Access Code: [email or web address] URL: [email or web address] Date: 06/29/2024 Prepared by: Lamarr Price  Exercises - Romberg Stance  - 2 x daily - 7 x weekly - 3 sets - 30 sec  hold - Seated Long Arc Quad  - 2 x daily - 7 x weekly - 2 sets - 10 reps - Seated March with Resistance  - 2 x daily - 7 x weekly - 2 sets - 10 reps - Seated Hip Abduction with Resistance  - 2 x daily - 7 x weekly - 2 sets - 10 reps - Clamshell  - 2 x daily - 7 x weekly - 2 sets - 10 reps - Heel Raises with Counter Support  - 2 x daily - 7 x weekly - 2 sets - 10 reps - Toe Raises with Counter Support  - 2 x daily - 7 x weekly - 2 sets - 10 reps - Sit to Stand  - 1 x daily - 7 x weekly - 3 sets - 10 reps - Seated Hip Flexor Stretch  - 1 x  daily - 7 x weekly - 1 sets - 3 reps - 30 sec hold - Standing Romberg to 3/4 Tandem Stance  - 1 x daily - 7 x weekly - 1 sets - 3 reps - 30 sec hold - Mini Squat  - 1 x daily - 7 x weekly - 3 sets - 10 reps - Supine Lower Trunk Rotation  - 1 x daily - 7 x weekly - 3 sets - 10 reps - Seated Flexion Stretch  - 1 x daily - 7 x weekly - 3 sets - 10 reps  ASSESSMENT:  CLINICAL IMPRESSION: Patient has met the ABC goal, indicating significant improvement with her confidence in balance with ADLs. Patient continues to be most challenged with NBOS and single leg stance activities. Continued progressing patient towards floor transfers with wall push-up progression and introduction of modified kneeling to stand. Patient will continue to benefit from skilled therapy to progress floor transfers, balance and ability to safely navigate home and community.    EVAL: Patient is a 73 y.o. female who was seen today for physical therapy evaluation and treatment for other abnormalities of gait and mobility. Patient reports at least 1 year history of gait abnormalities and lower extremity weakness. She does  note onset of low back pain around the same time she began to notice leg weakness and gait abnormalities. Upon assessment she is noted to have significant bilateral hip weakness and mild quadriceps weakness. She has gait abnormalities with limited foot clearance and decreased step length. She scores at an increased fall risk based upon TUG and BERG balance assessment. She will benefit from skilled PT to address the above stated deficits in order to optimize her function and reduce her risk of future falls.   OBJECTIVE IMPAIRMENTS: Abnormal gait, decreased activity tolerance, decreased balance, decreased endurance, decreased knowledge of condition, difficulty walking, decreased strength, postural dysfunction, and pain.   ACTIVITY LIMITATIONS: carrying, lifting, bending, standing, squatting, stairs, transfers, and  locomotion level  PARTICIPATION LIMITATIONS: meal prep, cleaning, laundry, shopping, community activity, and yard work  PERSONAL FACTORS: Age, Fitness, Time since onset of injury/illness/exacerbation, and 3+ comorbidities: see PMH above are also affecting patient's functional outcome.   REHAB POTENTIAL: Good  CLINICAL DECISION MAKING: Evolving/moderate complexity  EVALUATION COMPLEXITY: Moderate   GOALS: Goals reviewed with patient? Yes  SHORT TERM GOALS: Target date: 07/13/2024  Patient will be independent and compliant with initial HEP.  Baseline: initial HEP issued Goal status: MET  2.  Patient will complete TUG in </= 12 seconds to reduce fall risk.  Baseline: see above Goal status: MET  3.  Patient will demonstrate at least 4/5 bilateral hip flexor and 3+/5 hip extensor strength to improve gait stability.   Baseline: see above  Goal status: MET   LONG TERM GOALS: Target date: 08/12/24  Patient will be independent with floor transfer.  Baseline: requires assistance from husband  Goal status: INITIAL  2.  Patient will be able to reach into bottom cabinets at home to pick up items.  Baseline: unable, requires husband to reach into bottom cabinets at home.  07/20/24: continues to require assist from husband Goal status: IN PROGRESS  3.  Patient will score >/= 45/56 on the BERG to reduce risk of falls.  Baseline: see above 07/20/24: 36/56 Goal status: IN PROGRESS  4.  Patient will score at least 50% confidence on the ABC balance scale.  Baseline: see above 07/20/24: 64.4% Goal status: MET  5.  Patient will be independent with advanced home program to progress/maintain current level of function.  Baseline: initial HEP issued  Goal status: INITIAL   PLAN:  PT FREQUENCY: 2x/week  PT DURATION: 8 weeks  PLANNED INTERVENTIONS: 97164- PT Re-evaluation, 97750- Physical Performance Testing, 97110-Therapeutic exercises, 97530- Therapeutic activity, [id number]- Neuromuscular  re-education, 97535- Self Care, [id number]- Manual therapy, [id number]- Gait training, [id number] (1-2 muscles), [id number] (3+ muscles)- Dry Needling, Cryotherapy, and Moist heat  PLAN FOR NEXT SESSION: progress hip strengthening. Static & dynamic balance activities. Sit to stands. Gait training working on increasing step length, reducing BOS, and improved clearance   Lamarr Price, PTA 07/20/24 3:19 PM

## 2024-07-25 ENCOUNTER — Ambulatory Visit: Attending: Family Medicine

## 2024-07-25 DIAGNOSIS — R2689 Other abnormalities of gait and mobility: Secondary | ICD-10-CM | POA: Insufficient documentation

## 2024-07-25 DIAGNOSIS — M6281 Muscle weakness (generalized): Secondary | ICD-10-CM | POA: Insufficient documentation

## 2024-07-25 DIAGNOSIS — Z9181 History of falling: Secondary | ICD-10-CM | POA: Insufficient documentation

## 2024-07-25 NOTE — Therapy (Signed)
 OUTPATIENT PHYSICAL THERAPY LOWER EXTREMITY TREATMENT       Patient Name: Jane [name] MRN: [id number] DOB:03/29/1951, 73 y.o., female Today's Date: 07/25/2024  END OF SESSION:  PT End of Session - 07/25/24 0932     Visit Number 11    Number of Visits 17    Date for PT Re-Evaluation 08/12/24    Authorization Type [employer]    Progress Note Due on Visit 20    PT Start Time 0932    Activity Tolerance Patient tolerated treatment well    Behavior During Therapy [facility] for tasks assessed/performed           Past Medical History:  Diagnosis Date   Arthritis    Cancer (HCC)    skin nose   GERD (gastroesophageal reflux disease)    Heart murmur    when pregnant   Hepatitis    75  from food   History of recurrent UTIs    on  prophalatic med   Hypertension    Pneumonia    hx   Past Surgical History:  Procedure Laterality Date   CESAREAN SECTION     COLON SURGERY     resection for diverticulitis   TOTAL HIP ARTHROPLASTY     TOTAL HIP ARTHROPLASTY Left 07/17/2014   Procedure: LEFT TOTAL HIP ARTHROPLASTY ANTERIOR APPROACH;  Surgeon: Lonni CINDERELLA Poli, MD;  Location: [facility] OR;  Service: Orthopedics;  Laterality: Left;   Patient Active Problem List   Diagnosis Date Noted   Arthritis of left hip 07/17/2014   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/17/2014   GERD (gastroesophageal reflux disease)    HYPERTENSION 01/30/2010   DIVERTICULOSIS-COLON 01/30/2010   DIVERTICULITIS OF COLON 01/27/2010   CHANGE IN BOWELS 01/21/2010   ABDOMINAL PAIN-LLQ 01/21/2010   ABDOMINAL PAIN, LOWER 01/21/2010   UTI'S, HX OF 01/21/2010   HYPERLIPIDEMIA 10/28/2007   GERD 10/28/2007   HIATAL HERNIA 10/28/2007   IRRITABLE BOWEL SYNDROME 10/28/2007   OSTEOARTHRITIS 10/28/2007   OSTEOARTHRITIS 10/28/2007   ARTHRITIS 10/28/2007   History of colonic polyps 10/28/2007    PCP: Morgan Drivers, MD  REFERRING PROVIDER: Katina Pfeiffer, PA-C   REFERRING DIAG: R26.89  (ICD-10-CM) - Other abnormalities of gait and mobility   THERAPY DIAG:  Muscle weakness (generalized)  Other abnormalities of gait and mobility  History of falling  Rationale for Evaluation and Treatment: Rehabilitation  ONSET DATE: July 2024   SUBJECTIVE:   SUBJECTIVE STATEMENT: Patient reports she is feeling pretty good. She started taking her diuretic, which is helping with her swelling. No pain right now. When her back hurts she does her flexion exercises help.   PERTINENT HISTORY: THA- bilateral  PAIN:  Are you having pain? No  PRECAUTIONS: Fall  RED FLAGS: None   WEIGHT BEARING RESTRICTIONS: No  FALLS:  Has patient fallen in last 6 months? Yes. Number of falls 1, patient reports she had LOB and fell in the living room  LIVING ENVIRONMENT: Lives with: lives with their spouse Lives in: House/apartment Stairs: No Has following equipment at home: Single point cane and Environmental consultant - 4 wheeled  OCCUPATION: retired  PLOF: Independent  PATIENT GOALS: I want to be able to have more strength in my legs and walk normal.  NEXT MD VISIT: September 2025   OBJECTIVE:  Note: Objective measures were completed at Evaluation unless otherwise noted.  PATIENT SURVEYS:  ABC scale: The Activities-Specific Balance Confidence (ABC) Scale 0% 10 20 30  40 50 60 70 80 90 100%  No confidence<->completely confident  "How confident are you that you will not lose your balance or become unsteady when you . . .   Date tested 06/15/24  Walk around the house 100%  2. Walk up or down stairs 0%  3. Bend over and pick up a slipper from in front of a closet floor 10%  4. Reach for a small can off a shelf at eye level 100%  5. Stand on tip toes and reach for something above your head 10%  6. Stand on a chair and reach for something 0%  7. Sweep the floor 50%  8. Walk outside the house to a car parked in the driveway 100%  9. Get into or out of a car 100%  10. Walk across a parking lot to the  mall 20%  11. Walk up or down a ramp 20%  12. Walk in a crowded mall where people rapidly walk past you 100%  13. Are bumped into by people as you walk through the mall 0%  14. Step onto or off of an escalator while you are holding onto the railing 0%  15. Step onto or off an escalator while holding onto parcels such that you cannot hold onto the railing 0%  16. Walk outside on icy sidewalks 0%  Total: #/16  38 %     COGNITION: Overall cognitive status: Within functional limits for tasks assessed     SENSATION: Not tested    POSTURE: rounded shoulders and forward head   LOWER EXTREMITY MMT:  MMT Right eval Left eval 07/04/24 07/18/24  Hip flexion 3+ 3+ Rt: 3+; Lt: 4- Lt: 4+; Rt: 4+  Hip extension 3- 3-  4 bilateral   Hip abduction 3+ 3+    Hip adduction      Hip internal rotation      Hip external rotation      Knee flexion 5 5    Knee extension 4- 4- 4+ bilateral   Ankle dorsiflexion 5 5    Ankle plantarflexion 5 seated 5 seated    Ankle inversion 5 5    Ankle eversion 5 5     (Blank rows = not tested)   FUNCTIONAL TESTS:  TUG: 15 seconds   07/18/24: 10.76 seconds    BERG BALANCE TEST Sitting to Standing: 4.      Stands without using hands and stabilize independently Standing Unsupported: 4.      Stands safely for 2 minutes Sitting Unsupported: 4.     Sits for 2 minutes independently Standing to Sitting: 4.     Sits safely with minimal use of hands Transfers: 4.     Transfers safely with minor use of hands Standing with eyes closed: 4.     Stands safely for 10 seconds  Standing with feet together: 3.     Stands for 1 minute with supervision Reaching forward with outstretched arm: 3.     Reaches forward 5 inches Retrieving object from the floor: 4.      Able to pick up easily and safely Turning to look behind: 4.     Looks behind from both sides and weight shifts well Turning 360 degrees: 2.     Able to turn slowly, but safely Place alternate foot on stool: 3.      Completes 8 steps in >20 seconds Standing with one foot in front: 2.     Independent small step for 30 seconds Standing on one foot: 2.  Holds >/=3 seconds  Total Score: 36/56  GAIT: Distance walked: 20 ft  Assistive device utilized: None Level of assistance: Complete Independence Comments: limited foot clearance, small step length (not a true shuffling pattern, but more like a small march)  [facility] Adult PT Treatment:                                                DATE: 07/25/24  Therapeutic Activity: Ladder stepping activity, working on reciprocal step to increase length multiple reps Side stepping in ladder 3 sets d/b  Step taps 2 x 10; aerobic stepper + 2 inserts Step ups on aerobic stepper + 1 insert x 5 each    [facility] Adult PT Treatment:                                                DATE: 07/20/2024 Therapeutic Activity: ABC Berg Wall push-ups x10, x12 --> staggered hands x5 each side  Curb navigation + SPC --> no AD Reaching down to bottom cabinet to put objects on counter --> cones, 1#-3#DB, 5#KB 1/2 kneel down + UE support on table --> airex + therapad to kneel on    [facility] Adult PT Treatment:                                                DATE: 07/18/24 Therapeutic Exercise: Hip MMT  Seated HS curl blue band 2 x 10   Therapeutic Activity: TUG Curb negotiation and outdoor walking; gait belt Sit to stand from lower height; 5 lbs 2 x 10  Squats with UE support (working on increasing depth) x 10  Mini lunge with UE support x 10 each    [facility] Adult PT Treatment:                                                DATE: 07/13/2024 Therapeutic Exercise: NuStep L5 x 5 min  Standing heel raises + tennis ball b/w heels 2x10 Standing hip abduction & extension x10 each (bil) Seated forward flexion stretch Neuromuscular re-ed: Rhomberg stance balance + head turns/nods Staggered stance + head turns/nods Therapeutic Activity: Cone pick-up  Squats + seat tap on pillow with arms  reaching forward 2x10 Wall push-ups 2x10 Walking with gait speed prompts    [facility] Adult PT Treatment:                                                DATE: 07/11/2024 Therapeutic Exercise: Seated trunk flexion stretch Standing heel raises + tennis ball b/w heels Standing gastroc stretch NuStep interval training L4 <--> L6 30  intervals x 4 min --> L3-2 x 1 min cool down Therapeutic Activity: Cone weaving Cone slide to side --> SPC Gait: Walking with focus on equal stride length   [facility] Adult PT Treatment:  DATE: 07/04/24 Therapeutic Exercise: NuStep level 4 x 5 minutes UE/LE LAQ 2 x 10; 1 lbs  Heel raises 2 x 10  Calf raises x 10  Gait training: Pre-gait: forward step overs x 10 each with single UE support working on heel strike Gait training in [facility] hallway and outside on sidewalk focusing on narrowing base and allowing for heel strike and push-off  Therapeutic Activity: Curb negotiation requires HHA                                                                                                                           PATIENT EDUCATION:  Education details: HEP review  Person educated: Patient Education method: Explanation Education comprehension: verbalized understanding  HOME EXERCISE PROGRAM: Access Code: [email or web address] URL: [email or web address] Date: 06/29/2024 Prepared by: Lamarr Price  Exercises - Romberg Stance  - 2 x daily - 7 x weekly - 3 sets - 30 sec  hold - Seated Long Arc Quad  - 2 x daily - 7 x weekly - 2 sets - 10 reps - Seated March with Resistance  - 2 x daily - 7 x weekly - 2 sets - 10 reps - Seated Hip Abduction with Resistance  - 2 x daily - 7 x weekly - 2 sets - 10 reps - Clamshell  - 2 x daily - 7 x weekly - 2 sets - 10 reps - Heel Raises with Counter Support  - 2 x daily - 7 x weekly - 2 sets - 10 reps - Toe Raises with Counter Support  - 2 x daily - 7 x weekly - 2 sets - 10 reps -  Sit to Stand  - 1 x daily - 7 x weekly - 3 sets - 10 reps - Seated Hip Flexor Stretch  - 1 x daily - 7 x weekly - 1 sets - 3 reps - 30 sec hold - Standing Romberg to 3/4 Tandem Stance  - 1 x daily - 7 x weekly - 1 sets - 3 reps - 30 sec hold - Mini Squat  - 1 x daily - 7 x weekly - 3 sets - 10 reps - Supine Lower Trunk Rotation  - 1 x daily - 7 x weekly - 3 sets - 10 reps - Seated Flexion Stretch  - 1 x daily - 7 x weekly - 3 sets - 10 reps  ASSESSMENT:  CLINICAL IMPRESSION: Utilized ladder to work on increasing step length with good tolerance. She is initially able to complete step to pattern in ladder due to balance deficits. With continued reps she is able to complete reciprocal stepping in the ladder without LOB. Fatigued in gluteal musculature with side stepping in ladder with increased sway noted towards end of prescribed sets. Challenged with step taps and step ups without UE support with moderate sway noted, but did not require PT assist to regain balance with this activity.   EVAL: Patient is a 73 y.o. female who was seen today  for physical therapy evaluation and treatment for other abnormalities of gait and mobility. Patient reports at least 1 year history of gait abnormalities and lower extremity weakness. She does note onset of low back pain around the same time she began to notice leg weakness and gait abnormalities. Upon assessment she is noted to have significant bilateral hip weakness and mild quadriceps weakness. She has gait abnormalities with limited foot clearance and decreased step length. She scores at an increased fall risk based upon TUG and BERG balance assessment. She will benefit from skilled PT to address the above stated deficits in order to optimize her function and reduce her risk of future falls.   OBJECTIVE IMPAIRMENTS: Abnormal gait, decreased activity tolerance, decreased balance, decreased endurance, decreased knowledge of condition, difficulty walking, decreased  strength, postural dysfunction, and pain.   ACTIVITY LIMITATIONS: carrying, lifting, bending, standing, squatting, stairs, transfers, and locomotion level  PARTICIPATION LIMITATIONS: meal prep, cleaning, laundry, shopping, community activity, and yard work  PERSONAL FACTORS: Age, Fitness, Time since onset of injury/illness/exacerbation, and 3+ comorbidities: see PMH above are also affecting patient's functional outcome.   REHAB POTENTIAL: Good  CLINICAL DECISION MAKING: Evolving/moderate complexity  EVALUATION COMPLEXITY: Moderate   GOALS: Goals reviewed with patient? Yes  SHORT TERM GOALS: Target date: 07/13/2024  Patient will be independent and compliant with initial HEP.  Baseline: initial HEP issued Goal status: MET  2.  Patient will complete TUG in </= 12 seconds to reduce fall risk.  Baseline: see above Goal status: MET  3.  Patient will demonstrate at least 4/5 bilateral hip flexor and 3+/5 hip extensor strength to improve gait stability.   Baseline: see above  Goal status: MET   LONG TERM GOALS: Target date: 08/12/24  Patient will be independent with floor transfer.  Baseline: requires assistance from husband  Goal status: INITIAL  2.  Patient will be able to reach into bottom cabinets at home to pick up items.  Baseline: unable, requires husband to reach into bottom cabinets at home.  07/20/24: continues to require assist from husband Goal status: IN PROGRESS  3.  Patient will score >/= 45/56 on the BERG to reduce risk of falls.  Baseline: see above 07/20/24: 36/56 Goal status: IN PROGRESS  4.  Patient will score at least 50% confidence on the ABC balance scale.  Baseline: see above 07/20/24: 64.4% Goal status: MET  5.  Patient will be independent with advanced home program to progress/maintain current level of function.  Baseline: initial HEP issued  Goal status: INITIAL   PLAN:  PT FREQUENCY: 2x/week  PT DURATION: 8 weeks  PLANNED INTERVENTIONS:  [id number]- PT Re-evaluation, 97750- Physical Performance Testing, 97110-Therapeutic exercises, 97530- Therapeutic activity, [id number]- Neuromuscular re-education, 97535- Self Care, [id number]- Manual therapy, [id number]- Gait training, [phone] (1-2 muscles), [id number] (3+ muscles)- Dry Needling, Cryotherapy, and Moist heat  PLAN FOR NEXT SESSION: progress hip strengthening. Static & dynamic balance activities. Sit to stands. Gait training working on increasing step length, reducing BOS, and improved clearance; work on step ups   Lucie Meeter, PT, DPT, ATC 07/25/24 9:33 AM

## 2024-07-27 ENCOUNTER — Ambulatory Visit

## 2024-07-27 DIAGNOSIS — M6281 Muscle weakness (generalized): Secondary | ICD-10-CM | POA: Diagnosis not present

## 2024-07-27 DIAGNOSIS — R2689 Other abnormalities of gait and mobility: Secondary | ICD-10-CM

## 2024-07-27 DIAGNOSIS — Z9181 History of falling: Secondary | ICD-10-CM

## 2024-07-27 NOTE — Therapy (Signed)
 OUTPATIENT PHYSICAL THERAPY LOWER EXTREMITY TREATMENT     Patient Name: Jane [name] MRN: [id number] DOB:Aug 28, 1951, 73 y.o., female Today's Date: 07/27/2024  END OF SESSION:  PT End of Session - 07/27/24 0920     Visit Number 12    Number of Visits 17    Date for PT Re-Evaluation 08/12/24    Authorization Type [employer]    Progress Note Due on Visit 20    PT Start Time 0930    PT Stop Time 1015    PT Time Calculation (min) 45 min    Activity Tolerance Patient tolerated treatment well    Behavior During Therapy WFL for tasks assessed/performed          Past Medical History:  Diagnosis Date   Arthritis    Cancer (HCC)    skin nose   GERD (gastroesophageal reflux disease)    Heart murmur    when pregnant   Hepatitis    75  from food   History of recurrent UTIs    on  prophalatic med   Hypertension    Pneumonia    hx   Past Surgical History:  Procedure Laterality Date   CESAREAN SECTION     COLON SURGERY     resection for diverticulitis   TOTAL HIP ARTHROPLASTY     TOTAL HIP ARTHROPLASTY Left 07/17/2014   Procedure: LEFT TOTAL HIP ARTHROPLASTY ANTERIOR APPROACH;  Surgeon: Lonni CINDERELLA Poli, MD;  Location: [facility] OR;  Service: Orthopedics;  Laterality: Left;   Patient Active Problem List   Diagnosis Date Noted   Arthritis of left hip 07/17/2014   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/17/2014   GERD (gastroesophageal reflux disease)    HYPERTENSION 01/30/2010   DIVERTICULOSIS-COLON 01/30/2010   DIVERTICULITIS OF COLON 01/27/2010   CHANGE IN BOWELS 01/21/2010   ABDOMINAL PAIN-LLQ 01/21/2010   ABDOMINAL PAIN, LOWER 01/21/2010   UTI'S, HX OF 01/21/2010   HYPERLIPIDEMIA 10/28/2007   GERD 10/28/2007   HIATAL HERNIA 10/28/2007   IRRITABLE BOWEL SYNDROME 10/28/2007   OSTEOARTHRITIS 10/28/2007   OSTEOARTHRITIS 10/28/2007   ARTHRITIS 10/28/2007   History of colonic polyps 10/28/2007    PCP: Morgan Drivers, MD  REFERRING PROVIDER:  Katina Pfeiffer, PA-C   REFERRING DIAG: R26.89 (ICD-10-CM) - Other abnormalities of gait and mobility   THERAPY DIAG:  Muscle weakness (generalized)  Other abnormalities of gait and mobility  History of falling  Rationale for Evaluation and Treatment: Rehabilitation  ONSET DATE: July 2024   SUBJECTIVE:   SUBJECTIVE STATEMENT: Patient [place] PT session; states she felt fine leaving PT clinic but was hurting and shuffling when walking to car to leave the casino. Patient states back pain lasted two days; reports no back pain currently.   PERTINENT HISTORY: THA- bilateral  PAIN:  Are you having pain? 0/10 currently; 10/10 at worst  PRECAUTIONS: Fall  RED FLAGS: None   WEIGHT BEARING RESTRICTIONS: No  FALLS:  Has patient fallen in last 6 months? Yes. Number of falls 1, patient reports she had LOB and fell in the living room  LIVING ENVIRONMENT: Lives with: lives with their spouse Lives in: House/apartment Stairs: No Has following equipment at home: Single point cane and Environmental consultant - 4 wheeled  OCCUPATION: retired  PLOF: Independent  PATIENT GOALS: I want to be able to have more strength in my legs and walk normal.  NEXT MD VISIT: 08/09/24  OBJECTIVE:  Note: Objective measures  were completed at Evaluation unless otherwise noted.  PATIENT SURVEYS:  ABC scale: The Activities-Specific Balance Confidence (ABC) Scale 0% 10 20 30  40 50 60 70 80 90 100% No confidence<->completely confident  "How confident are you that you will not lose your balance or become unsteady when you . . .   Date tested 06/15/24  Walk around the house 100%  2. Walk up or down stairs 0%  3. Bend over and pick up a slipper from in front of a closet floor 10%  4. Reach for a small can off a shelf at eye level 100%  5. Stand on tip toes and reach for something above your head 10%  6. Stand on a chair and reach for something 0%  7.  Sweep the floor 50%  8. Walk outside the house to a car parked in the driveway 100%  9. Get into or out of a car 100%  10. Walk across a parking lot to the mall 20%  11. Walk up or down a ramp 20%  12. Walk in a crowded mall where people rapidly walk past you 100%  13. Are bumped into by people as you walk through the mall 0%  14. Step onto or off of an escalator while you are holding onto the railing 0%  15. Step onto or off an escalator while holding onto parcels such that you cannot hold onto the railing 0%  16. Walk outside on icy sidewalks 0%  Total: #/16  38 %     COGNITION: Overall cognitive status: Within functional limits for tasks assessed     SENSATION: Not tested    POSTURE: rounded shoulders and forward head   LOWER EXTREMITY MMT:  MMT Right eval Left eval 07/04/24 07/18/24  Hip flexion 3+ 3+ Rt: 3+; Lt: 4- Lt: 4+; Rt: 4+  Hip extension 3- 3-  4 bilateral   Hip abduction 3+ 3+    Hip adduction      Hip internal rotation      Hip external rotation      Knee flexion 5 5    Knee extension 4- 4- 4+ bilateral   Ankle dorsiflexion 5 5    Ankle plantarflexion 5 seated 5 seated    Ankle inversion 5 5    Ankle eversion 5 5     (Blank rows = not tested)   FUNCTIONAL TESTS:  TUG: 15 seconds   07/18/24: 10.76 seconds    BERG BALANCE TEST Sitting to Standing: 4.      Stands without using hands and stabilize independently Standing Unsupported: 4.      Stands safely for 2 minutes Sitting Unsupported: 4.     Sits for 2 minutes independently Standing to Sitting: 4.     Sits safely with minimal use of hands Transfers: 4.     Transfers safely with minor use of hands Standing with eyes closed: 4.     Stands safely for 10 seconds  Standing with feet together: 3.     Stands for 1 minute with supervision Reaching forward with outstretched arm: 3.     Reaches forward 5 inches Retrieving object from the floor: 4.      Able to pick up easily and safely Turning to look  behind: 4.     Looks behind from both sides and weight shifts well Turning 360 degrees: 2.     Able to turn slowly, but safely Place alternate foot on stool: 3.     Completes  8 steps in >20 seconds Standing with one foot in front: 2.     Independent small step for 30 seconds Standing on one foot: 2.     Holds >/=3 seconds  Total Score: 36/56  GAIT: Distance walked: 20 ft  Assistive device utilized: None Level of assistance: Complete Independence Comments: limited foot clearance, small step length (not a true shuffling pattern, but more like a small march)   [facility] Adult PT Treatment:                                                DATE: 07/27/2024 Neuromuscular re-ed: Supine hooklying PF activation (elevator) + ball b/w knees Small range SLR + TA activation Bridges + PF activation Therapeutic Activity: SKTC  Seated trunk flexion stretch with green PB roll out Wall push ups x10 --> staggered hands 2x8 Lunge squats to fatigue + UE support x 1  Self Care: Self-massage with rolling pin for quad tightness/tension    [facility] Adult PT Treatment:                                                DATE: 07/25/24  Therapeutic Activity: Ladder stepping activity, working on reciprocal step to increase length multiple reps Side stepping in ladder 3 sets d/b  Step taps 2 x 10; aerobic stepper + 2 inserts Step ups on aerobic stepper + 1 insert x 5 each    [facility] Adult PT Treatment:                                                DATE: 07/20/2024 Therapeutic Activity: ABC Berg Wall push-ups x10, x12 --> staggered hands x5 each side  Curb navigation + SPC --> no AD Reaching down to bottom cabinet to put objects on counter --> cones, 1#-3#DB, 5#KB 1/2 kneel down + UE support on table --> airex + therapad to kneel on                                                                                                                           PATIENT EDUCATION:  Education details: HEP review  Person  educated: Patient Education method: Explanation Education comprehension: verbalized understanding  HOME EXERCISE PROGRAM: Access Code: [email or web address] URL: [email or web address] Date: 06/29/2024 Prepared by: Lamarr Price  Exercises - Romberg Stance  - 2 x daily - 7 x weekly - 3 sets - 30 sec  hold - Seated Long Arc Quad  - 2 x daily - 7 x weekly - 2 sets - 10 reps - Seated March with Resistance  -  2 x daily - 7 x weekly - 2 sets - 10 reps - Seated Hip Abduction with Resistance  - 2 x daily - 7 x weekly - 2 sets - 10 reps - Clamshell  - 2 x daily - 7 x weekly - 2 sets - 10 reps - Heel Raises with Counter Support  - 2 x daily - 7 x weekly - 2 sets - 10 reps - Toe Raises with Counter Support  - 2 x daily - 7 x weekly - 2 sets - 10 reps - Sit to Stand  - 1 x daily - 7 x weekly - 3 sets - 10 reps - Seated Hip Flexor Stretch  - 1 x daily - 7 x weekly - 1 sets - 3 reps - 30 sec hold - Standing Romberg to 3/4 Tandem Stance  - 1 x daily - 7 x weekly - 1 sets - 3 reps - 30 sec hold - Mini Squat  - 1 x daily - 7 x weekly - 3 sets - 10 reps - Supine Lower Trunk Rotation  - 1 x daily - 7 x weekly - 3 sets - 10 reps - Seated Flexion Stretch  - 1 x daily - 7 x weekly - 3 sets - 10 reps  ASSESSMENT:  CLINICAL IMPRESSION: Incorporated pelvic floor activation and strengthening exercises during today's session. UE strengthening continued with wall push up variations to progress floor to standing transfers; patient fatigued quickly with lunge squats. Recommended patient use rolling pin for self-massage on bilateral quads/ITB to address muscle fatigue and soreness.   EVAL: Patient is a 73 y.o. female who was seen today for physical therapy evaluation and treatment for other abnormalities of gait and mobility. Patient reports at least 1 year history of gait abnormalities and lower extremity weakness. She does note onset of low back pain around the same time she began to notice leg weakness and gait  abnormalities. Upon assessment she is noted to have significant bilateral hip weakness and mild quadriceps weakness. She has gait abnormalities with limited foot clearance and decreased step length. She scores at an increased fall risk based upon TUG and BERG balance assessment. She will benefit from skilled PT to address the above stated deficits in order to optimize her function and reduce her risk of future falls.   OBJECTIVE IMPAIRMENTS: Abnormal gait, decreased activity tolerance, decreased balance, decreased endurance, decreased knowledge of condition, difficulty walking, decreased strength, postural dysfunction, and pain.   ACTIVITY LIMITATIONS: carrying, lifting, bending, standing, squatting, stairs, transfers, and locomotion level  PARTICIPATION LIMITATIONS: meal prep, cleaning, laundry, shopping, community activity, and yard work  PERSONAL FACTORS: Age, Fitness, Time since onset of injury/illness/exacerbation, and 3+ comorbidities: see PMH above are also affecting patient's functional outcome.   REHAB POTENTIAL: Good  CLINICAL DECISION MAKING: Evolving/moderate complexity  EVALUATION COMPLEXITY: Moderate   GOALS: Goals reviewed with patient? Yes  SHORT TERM GOALS: Target date: 07/13/2024  Patient will be independent and compliant with initial HEP.  Baseline: initial HEP issued Goal status: MET  2.  Patient will complete TUG in </= 12 seconds to reduce fall risk.  Baseline: see above Goal status: MET  3.  Patient will demonstrate at least 4/5 bilateral hip flexor and 3+/5 hip extensor strength to improve gait stability.   Baseline: see above  Goal status: MET   LONG TERM GOALS: Target date: 08/12/24  Patient will be independent with floor transfer.  Baseline: requires assistance from husband  Goal status: INITIAL  2.  Patient will be able to reach into bottom cabinets at home to pick up items.  Baseline: unable, requires husband to reach into bottom cabinets at home.   07/20/24: continues to require assist from husband Goal status: IN PROGRESS  3.  Patient will score >/= 45/56 on the BERG to reduce risk of falls.  Baseline: see above 07/20/24: 36/56 Goal status: IN PROGRESS  4.  Patient will score at least 50% confidence on the ABC balance scale.  Baseline: see above 07/20/24: 64.4% Goal status: MET  5.  Patient will be independent with advanced home program to progress/maintain current level of function.  Baseline: initial HEP issued  Goal status: INITIAL   PLAN:  PT FREQUENCY: 2x/week  PT DURATION: 8 weeks  PLANNED INTERVENTIONS: 97164- PT Re-evaluation, 97750- Physical Performance Testing, 97110-Therapeutic exercises, 97530- Therapeutic activity, [id number]- Neuromuscular re-education, 97535- Self Care, [id number]- Manual therapy, [id number]- Gait training, [phone] (1-2 muscles), [id number] (3+ muscles)- Dry Needling, Cryotherapy, and Moist heat  PLAN FOR NEXT SESSION: progress hip strengthening. Static & dynamic balance activities. Sit to stands. Gait training working on increasing step length, reducing BOS, and improved clearance; work on step ups   Lamarr Price, PTA 07/27/24 10:15 AM

## 2024-08-01 ENCOUNTER — Ambulatory Visit

## 2024-08-01 DIAGNOSIS — M6281 Muscle weakness (generalized): Secondary | ICD-10-CM | POA: Diagnosis not present

## 2024-08-01 DIAGNOSIS — Z9181 History of falling: Secondary | ICD-10-CM

## 2024-08-01 DIAGNOSIS — R2689 Other abnormalities of gait and mobility: Secondary | ICD-10-CM

## 2024-08-01 NOTE — Therapy (Signed)
 OUTPATIENT PHYSICAL THERAPY LOWER EXTREMITY TREATMENT     Patient Name: Jane [name] MRN: [id number] DOB:Feb 14, 1951, 73 y.o., female Today's Date: 08/01/2024  END OF SESSION:  PT End of Session - 08/01/24 0933     Visit Number 13    Number of Visits 17    Date for PT Re-Evaluation 08/12/24    Authorization Type [employer]    Progress Note Due on Visit 20    PT Start Time 0933    PT Stop Time 1014    PT Time Calculation (min) 41 min    Activity Tolerance Patient tolerated treatment well    Behavior During Therapy WFL for tasks assessed/performed           Past Medical History:  Diagnosis Date   Arthritis    Cancer (HCC)    skin nose   GERD (gastroesophageal reflux disease)    Heart murmur    when pregnant   Hepatitis    75  from food   History of recurrent UTIs    on  prophalatic med   Hypertension    Pneumonia    hx   Past Surgical History:  Procedure Laterality Date   CESAREAN SECTION     COLON SURGERY     resection for diverticulitis   TOTAL HIP ARTHROPLASTY     TOTAL HIP ARTHROPLASTY Left 07/17/2014   Procedure: LEFT TOTAL HIP ARTHROPLASTY ANTERIOR APPROACH;  Surgeon: Lonni CINDERELLA Poli, MD;  Location: [facility] OR;  Service: Orthopedics;  Laterality: Left;   Patient Active Problem List   Diagnosis Date Noted   Arthritis of left hip 07/17/2014   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/17/2014   GERD (gastroesophageal reflux disease)    HYPERTENSION 01/30/2010   DIVERTICULOSIS-COLON 01/30/2010   DIVERTICULITIS OF COLON 01/27/2010   CHANGE IN BOWELS 01/21/2010   ABDOMINAL PAIN-LLQ 01/21/2010   ABDOMINAL PAIN, LOWER 01/21/2010   UTI'S, HX OF 01/21/2010   HYPERLIPIDEMIA 10/28/2007   GERD 10/28/2007   HIATAL HERNIA 10/28/2007   IRRITABLE BOWEL SYNDROME 10/28/2007   OSTEOARTHRITIS 10/28/2007   OSTEOARTHRITIS 10/28/2007   ARTHRITIS 10/28/2007   History of colonic polyps 10/28/2007    PCP: Morgan Drivers, MD  REFERRING PROVIDER:  Katina Pfeiffer, PA-C   REFERRING DIAG: R26.89 (ICD-10-CM) - Other abnormalities of gait and mobility   THERAPY DIAG:  Muscle weakness (generalized)  Other abnormalities of gait and mobility  History of falling  Rationale for Evaluation and Treatment: Rehabilitation  ONSET DATE: July 2024   SUBJECTIVE:   SUBJECTIVE STATEMENT: Patient reports on Friday she was shopping in [place] and her legs felt weak and was sweating and dizzy. She was able to sit down and then make it to the car. She thinks these were side effects from her diuretic, so she stopped taking this. She still feels weak in her legs, but is no longer experiences dizziness or sweating. She has f/u with PCP on 08/09/24.   PERTINENT HISTORY: THA- bilateral  PAIN:  Are you having pain? 0/10 currently; 10/10 at worst low back pain; aggravated with prolonged standing/walking activity.   PRECAUTIONS: Fall  RED FLAGS: None   WEIGHT BEARING RESTRICTIONS: No  FALLS:  Has patient fallen in last 6 months? Yes. Number of falls 1, patient reports she had LOB and fell in the living room  LIVING ENVIRONMENT: Lives with: lives with their spouse Lives in: House/apartment Stairs: No Has following equipment at home: Single point cane and Environmental consultant - 4 wheeled  OCCUPATION: retired  PLOF: Independent  PATIENT GOALS: I want to be able to have more strength in my legs and walk normal.  NEXT MD VISIT: 08/09/24  OBJECTIVE:  Note: Objective measures were completed at Evaluation unless otherwise noted.  PATIENT SURVEYS:  ABC scale: The Activities-Specific Balance Confidence (ABC) Scale 0% 10 20 30  40 50 60 70 80 90 100% No confidence<->completely confident  "How confident are you that you will not lose your balance or become unsteady when you . . .   Date tested 06/15/24  Walk around the house 100%  2. Walk up or down stairs 0%  3. Bend over and pick up a slipper from in front of a closet floor 10%  4. Reach for a small  can off a shelf at eye level 100%  5. Stand on tip toes and reach for something above your head 10%  6. Stand on a chair and reach for something 0%  7. Sweep the floor 50%  8. Walk outside the house to a car parked in the driveway 100%  9. Get into or out of a car 100%  10. Walk across a parking lot to the mall 20%  11. Walk up or down a ramp 20%  12. Walk in a crowded mall where people rapidly walk past you 100%  13. Are bumped into by people as you walk through the mall 0%  14. Step onto or off of an escalator while you are holding onto the railing 0%  15. Step onto or off an escalator while holding onto parcels such that you cannot hold onto the railing 0%  16. Walk outside on icy sidewalks 0%  Total: #/16  38 %     COGNITION: Overall cognitive status: Within functional limits for tasks assessed     SENSATION: Not tested    POSTURE: rounded shoulders and forward head   LOWER EXTREMITY MMT:  MMT Right eval Left eval 07/04/24 07/18/24 08/01/24  Hip flexion 3+ 3+ Rt: 3+; Lt: 4- Lt: 4+; Rt: 4+ Lt: 4+; Rt: 5  Hip extension 3- 3-  4 bilateral    Hip abduction 3+ 3+   Rt: 3+; Lt: 4-   Hip adduction       Hip internal rotation       Hip external rotation       Knee flexion 5 5   5  bilateral   Knee extension 4- 4- 4+ bilateral  5 bilateral   Ankle dorsiflexion 5 5   5  bilateral   Ankle plantarflexion 5 seated 5 seated   5 bilateral seated  Ankle inversion 5 5   5  bilateral  Ankle eversion 5 5   5  bilateral   (Blank rows = not tested)   FUNCTIONAL TESTS:  TUG: 15 seconds   07/18/24: 10.76 seconds    BERG BALANCE TEST Sitting to Standing: 4.      Stands without using hands and stabilize independently Standing Unsupported: 4.      Stands safely for 2 minutes Sitting Unsupported: 4.     Sits for 2 minutes independently Standing to Sitting: 4.     Sits safely with minimal use of hands Transfers: 4.     Transfers safely with minor use of hands Standing with eyes closed: 4.      Stands safely for 10 seconds  Standing with feet together: 3.     Stands for 1 minute with supervision Reaching forward with outstretched arm: 3.     Reaches forward 5 inches  Retrieving object from the floor: 4.      Able to pick up easily and safely Turning to look behind: 4.     Looks behind from both sides and weight shifts well Turning 360 degrees: 2.     Able to turn slowly, but safely Place alternate foot on stool: 3.     Completes 8 steps in >20 seconds Standing with one foot in front: 2.     Independent small step for 30 seconds Standing on one foot: 2.     Holds >/=3 seconds  Total Score: 36/56  GAIT: Distance walked: 20 ft  Assistive device utilized: None Level of assistance: Complete Independence Comments: limited foot clearance, small step length (not a true shuffling pattern, but more like a small march)  [facility] Adult PT Treatment:                                                DATE: 08/01/24 Therapeutic Exercise: LE MMT Seated trunk flexion x 10  Sidelying hip abduction x 10 each HEP update   Therapeutic Activity: Walking in [facility]- for walking in endurance  Walking 1 lap in clinic after trunk flexion  Step up 6 inch with single UE x 10 each  Step up 6 inch no UE support x 10 each   Self Care: Discuss medication with PCP at upcoming visit   [facility] Adult PT Treatment:                                                DATE: 07/27/2024 Neuromuscular re-ed: Supine hooklying PF activation (elevator) + ball b/w knees Small range SLR + TA activation Bridges + PF activation Therapeutic Activity: SKTC  Seated trunk flexion stretch with green PB roll out Wall push ups x10 --> staggered hands 2x8 Lunge squats to fatigue + UE support x 1  Self Care: Self-massage with rolling pin for quad tightness/tension    [facility] Adult PT Treatment:                                                DATE: 07/25/24  Therapeutic Activity: Ladder stepping activity, working on reciprocal step to  increase length multiple reps Side stepping in ladder 3 sets d/b  Step taps 2 x 10; aerobic stepper + 2 inserts Step ups on aerobic stepper + 1 insert x 5 each    [facility] Adult PT Treatment:                                                DATE: 07/20/2024 Therapeutic Activity: ABC Berg Wall push-ups x10, x12 --> staggered hands x5 each side  Curb navigation + SPC --> no AD Reaching down to bottom cabinet to put objects on counter --> cones, 1#-3#DB, 5#KB 1/2 kneel down + UE support on table --> airex + therapad to kneel on  PATIENT EDUCATION:  Education details: HEP update  Person educated: Patient Education method: Explanation, demo, cues, handout Education comprehension: verbalized understanding, returned demo, cues   HOME EXERCISE PROGRAM: Access Code: [email or web address] URL: [email or web address] Date: 08/01/2024 Prepared by: Lucie Meeter  Exercises - Romberg Stance  - 2 x daily - 7 x weekly - 3 sets - 30 sec  hold - Seated Long Arc Quad  - 2 x daily - 7 x weekly - 2 sets - 10 reps - Seated March with Resistance  - 2 x daily - 7 x weekly - 2 sets - 10 reps - Seated Hip Abduction with Resistance  - 2 x daily - 7 x weekly - 2 sets - 10 reps - Clamshell  - 2 x daily - 7 x weekly - 2 sets - 10 reps - Heel Raises with Counter Support  - 2 x daily - 7 x weekly - 2 sets - 10 reps - Toe Raises with Counter Support  - 2 x daily - 7 x weekly - 2 sets - 10 reps - Sit to Stand  - 1 x daily - 7 x weekly - 3 sets - 10 reps - Seated Hip Flexor Stretch  - 1 x daily - 7 x weekly - 1 sets - 3 reps - 30 sec hold - Standing Romberg to 3/4 Tandem Stance  - 1 x daily - 7 x weekly - 1 sets - 3 reps - 30 sec hold - Mini Squat  - 1 x daily - 7 x weekly - 3 sets - 10 reps - Supine Lower Trunk Rotation  - 1 x daily - 7 x weekly - 3 sets - 10 reps - Seated Flexion Stretch  - 1 x  daily - 7 x weekly - 3 sets - 10 reps - Wall Push Up  - 1 x daily - 7 x weekly - 3 sets - 10 reps - Sidelying Hip Abduction  - 1 x daily - 7 x weekly - 2 sets - 10 reps  ASSESSMENT:  CLINICAL IMPRESSION: Patient reports episode of dizziness, sweating,and weakness when shopping over the weekend. Believes it was a medication side effect, so stopped taking her diuretic. When walking in [facility] center she reports onset of low back pain towards end of walk and mild shuffling emerges. Her back pain is relieved with repeated flexion and no signs of shuffling present when walking after seated rest break/trunk flexion activity. She reported fatigue in hip abductors with step up activity, but able to complete independently from 6 inch step without LOB. She does continue to have significant hip abductor weakness otherwise LE strength has improved.   EVAL: Patient is a 73 y.o. female who was seen today for physical therapy evaluation and treatment for other abnormalities of gait and mobility. Patient reports at least 1 year history of gait abnormalities and lower extremity weakness. She does note onset of low back pain around the same time she began to notice leg weakness and gait abnormalities. Upon assessment she is noted to have significant bilateral hip weakness and mild quadriceps weakness. She has gait abnormalities with limited foot clearance and decreased step length. She scores at an increased fall risk based upon TUG and BERG balance assessment. She will benefit from skilled PT to address the above stated deficits in order to optimize her function and reduce her risk of future falls.   OBJECTIVE IMPAIRMENTS: Abnormal gait, decreased activity tolerance, decreased balance, decreased endurance, decreased knowledge of condition, difficulty walking, decreased  strength, postural dysfunction, and pain.   ACTIVITY LIMITATIONS: carrying, lifting, bending, standing, squatting, stairs, transfers, and locomotion  level  PARTICIPATION LIMITATIONS: meal prep, cleaning, laundry, shopping, community activity, and yard work  PERSONAL FACTORS: Age, Fitness, Time since onset of injury/illness/exacerbation, and 3+ comorbidities: see PMH above are also affecting patient's functional outcome.   REHAB POTENTIAL: Good  CLINICAL DECISION MAKING: Evolving/moderate complexity  EVALUATION COMPLEXITY: Moderate   GOALS: Goals reviewed with patient? Yes  SHORT TERM GOALS: Target date: 07/13/2024  Patient will be independent and compliant with initial HEP.  Baseline: initial HEP issued Goal status: MET  2.  Patient will complete TUG in </= 12 seconds to reduce fall risk.  Baseline: see above Goal status: MET  3.  Patient will demonstrate at least 4/5 bilateral hip flexor and 3+/5 hip extensor strength to improve gait stability.   Baseline: see above  Goal status: MET   LONG TERM GOALS: Target date: 08/12/24  Patient will be independent with floor transfer.  Baseline: requires assistance from husband  Goal status: INITIAL  2.  Patient will be able to reach into bottom cabinets at home to pick up items.  Baseline: unable, requires husband to reach into bottom cabinets at home.  07/20/24: continues to require assist from husband Goal status: IN PROGRESS  3.  Patient will score >/= 45/56 on the BERG to reduce risk of falls.  Baseline: see above 07/20/24: 36/56 Goal status: IN PROGRESS  4.  Patient will score at least 50% confidence on the ABC balance scale.  Baseline: see above 07/20/24: 64.4% Goal status: MET  5.  Patient will be independent with advanced home program to progress/maintain current level of function.  Baseline: initial HEP issued  Goal status: INITIAL   PLAN:  PT FREQUENCY: 2x/week  PT DURATION: 8 weeks  PLANNED INTERVENTIONS: 97164- PT Re-evaluation, 97750- Physical Performance Testing, 97110-Therapeutic exercises, 97530- Therapeutic activity, [id number]- Neuromuscular  re-education, 97535- Self Care, [id number]- Manual therapy, [id number]- Gait training, [phone] (1-2 muscles), [id number] (3+ muscles)- Dry Needling, Cryotherapy, and Moist heat  PLAN FOR NEXT SESSION: progress hip strengthening. Static & dynamic balance activities. Sit to stands. Gait training working on increasing step length, reducing BOS, and improved clearance; work on step ups; glute med strengthening   Janathan Bribiesca, PT, DPT, ATC 08/01/24 10:16 AM

## 2024-08-03 ENCOUNTER — Ambulatory Visit

## 2024-08-03 DIAGNOSIS — Z9181 History of falling: Secondary | ICD-10-CM

## 2024-08-03 DIAGNOSIS — R2689 Other abnormalities of gait and mobility: Secondary | ICD-10-CM

## 2024-08-03 DIAGNOSIS — M6281 Muscle weakness (generalized): Secondary | ICD-10-CM

## 2024-08-03 NOTE — Therapy (Signed)
 OUTPATIENT PHYSICAL THERAPY LOWER EXTREMITY TREATMENT     Patient Name: Jane [name] MRN: [id number] DOB:Nov 18, 1951, 73 y.o., female Today's Date: 08/03/2024  END OF SESSION:  PT End of Session - 08/03/24 9077     Visit Number 14    Number of Visits 17    Date for PT Re-Evaluation 08/12/24    Authorization Type [employer]    Progress Note Due on Visit 20    PT Start Time 0925    PT Stop Time 1010    PT Time Calculation (min) 45 min    Activity Tolerance Patient tolerated treatment well    Behavior During Therapy WFL for tasks assessed/performed            Past Medical History:  Diagnosis Date   Arthritis    Cancer (HCC)    skin nose   GERD (gastroesophageal reflux disease)    Heart murmur    when pregnant   Hepatitis    75  from food   History of recurrent UTIs    on  prophalatic med   Hypertension    Pneumonia    hx   Past Surgical History:  Procedure Laterality Date   CESAREAN SECTION     COLON SURGERY     resection for diverticulitis   TOTAL HIP ARTHROPLASTY     TOTAL HIP ARTHROPLASTY Left 07/17/2014   Procedure: LEFT TOTAL HIP ARTHROPLASTY ANTERIOR APPROACH;  Surgeon: Lonni CINDERELLA Poli, MD;  Location: [facility] OR;  Service: Orthopedics;  Laterality: Left;   Patient Active Problem List   Diagnosis Date Noted   Arthritis of left hip 07/17/2014   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/17/2014   GERD (gastroesophageal reflux disease)    HYPERTENSION 01/30/2010   DIVERTICULOSIS-COLON 01/30/2010   DIVERTICULITIS OF COLON 01/27/2010   CHANGE IN BOWELS 01/21/2010   ABDOMINAL PAIN-LLQ 01/21/2010   ABDOMINAL PAIN, LOWER 01/21/2010   UTI'S, HX OF 01/21/2010   HYPERLIPIDEMIA 10/28/2007   GERD 10/28/2007   HIATAL HERNIA 10/28/2007   IRRITABLE BOWEL SYNDROME 10/28/2007   OSTEOARTHRITIS 10/28/2007   OSTEOARTHRITIS 10/28/2007   ARTHRITIS 10/28/2007   History of colonic polyps 10/28/2007    PCP: Morgan Drivers, MD  REFERRING PROVIDER:  Katina Pfeiffer, PA-C   REFERRING DIAG: R26.89 (ICD-10-CM) - Other abnormalities of gait and mobility   THERAPY DIAG:  Muscle weakness (generalized)  Other abnormalities of gait and mobility  History of falling  Rationale for Evaluation and Treatment: Rehabilitation  ONSET DATE: July 2024   SUBJECTIVE:   SUBJECTIVE STATEMENT: Patient reports she had B12 shot this morning and talked to the doctor about another episode of leg weakness and sweating when walking in [employer] yesterday (about 45 minutes into walking). Was instructed to f/u with PCP regarding these complaints. She has f/u with PCP next week. Denies any symptoms currently.   PERTINENT HISTORY: THA- bilateral  PAIN:  Are you having pain? 0/10 currently; 10/10 at worst low back pain; aggravated with prolonged standing/walking activity.   PRECAUTIONS: Fall  RED FLAGS: None   WEIGHT BEARING RESTRICTIONS: No  FALLS:  Has patient fallen in last 6 months? Yes. Number of falls 1, patient reports she had LOB and fell in the living room  LIVING ENVIRONMENT: Lives with: lives with their spouse Lives in: House/apartment Stairs: No Has following equipment at home: Single point cane and Environmental consultant - 4 wheeled  OCCUPATION: retired  PLOF: Independent  PATIENT GOALS: I want to be able to have more strength in my  legs and walk normal.  NEXT MD VISIT: 08/09/24  OBJECTIVE:  Note: Objective measures were completed at Evaluation unless otherwise noted.  PATIENT SURVEYS:  ABC scale: The Activities-Specific Balance Confidence (ABC) Scale 0% 10 20 30  40 50 60 70 80 90 100% No confidence<->completely confident  "How confident are you that you will not lose your balance or become unsteady when you . . .   Date tested 06/15/24  Walk around the house 100%  2. Walk up or down stairs 0%  3. Bend over and pick up a slipper from in front of a closet floor 10%  4. Reach for a small can off a shelf at eye level 100%  5. Stand on  tip toes and reach for something above your head 10%  6. Stand on a chair and reach for something 0%  7. Sweep the floor 50%  8. Walk outside the house to a car parked in the driveway 100%  9. Get into or out of a car 100%  10. Walk across a parking lot to the mall 20%  11. Walk up or down a ramp 20%  12. Walk in a crowded mall where people rapidly walk past you 100%  13. Are bumped into by people as you walk through the mall 0%  14. Step onto or off of an escalator while you are holding onto the railing 0%  15. Step onto or off an escalator while holding onto parcels such that you cannot hold onto the railing 0%  16. Walk outside on icy sidewalks 0%  Total: #/16  38 %     COGNITION: Overall cognitive status: Within functional limits for tasks assessed     SENSATION: Not tested    POSTURE: rounded shoulders and forward head   LOWER EXTREMITY MMT:  MMT Right eval Left eval 07/04/24 07/18/24 08/01/24  Hip flexion 3+ 3+ Rt: 3+; Lt: 4- Lt: 4+; Rt: 4+ Lt: 4+; Rt: 5  Hip extension 3- 3-  4 bilateral    Hip abduction 3+ 3+   Rt: 3+; Lt: 4-   Hip adduction       Hip internal rotation       Hip external rotation       Knee flexion 5 5   5  bilateral   Knee extension 4- 4- 4+ bilateral  5 bilateral   Ankle dorsiflexion 5 5   5  bilateral   Ankle plantarflexion 5 seated 5 seated   5 bilateral seated  Ankle inversion 5 5   5  bilateral  Ankle eversion 5 5   5  bilateral   (Blank rows = not tested)   FUNCTIONAL TESTS:  TUG: 15 seconds   07/18/24: 10.76 seconds    BERG BALANCE TEST Sitting to Standing: 4.      Stands without using hands and stabilize independently Standing Unsupported: 4.      Stands safely for 2 minutes Sitting Unsupported: 4.     Sits for 2 minutes independently Standing to Sitting: 4.     Sits safely with minimal use of hands Transfers: 4.     Transfers safely with minor use of hands Standing with eyes closed: 4.     Stands safely for 10 seconds  Standing with  feet together: 3.     Stands for 1 minute with supervision Reaching forward with outstretched arm: 3.     Reaches forward 5 inches Retrieving object from the floor: 4.      Able to pick up easily  and safely Turning to look behind: 4.     Looks behind from both sides and weight shifts well Turning 360 degrees: 2.     Able to turn slowly, but safely Place alternate foot on stool: 3.     Completes 8 steps in >20 seconds Standing with one foot in front: 2.     Independent small step for 30 seconds Standing on one foot: 2.     Holds >/=3 seconds  Total Score: 36/56  GAIT: Distance walked: 20 ft  Assistive device utilized: None Level of assistance: Complete Independence Comments: limited foot clearance, small step length (not a true shuffling pattern, but more like a small march) [facility] Adult PT Treatment:                                                DATE: 08/03/24 Therapeutic Exercise: Sidelying hip abduction 2 x 10  Seated HS curl 2 x 10 @ green band HEP review   Therapeutic Activity: Sit to stand lower height 2 x 10; no UE support  Floor transfers Walking 2 laps in clinic   Self Care: Recommended to discuss weakness and sweating, back pain, and discontinuing diuretics at upcoming PCP appointment  [facility] Adult PT Treatment:                                                DATE: 08/01/24 Therapeutic Exercise: LE MMT Seated trunk flexion x 10  Sidelying hip abduction x 10 each HEP update   Therapeutic Activity: Walking in [facility]- for walking in endurance  Walking 1 lap in clinic after trunk flexion  Step up 6 inch with single UE x 10 each  Step up 6 inch no UE support x 10 each   Self Care: Discuss medication with PCP at upcoming visit   [facility] Adult PT Treatment:                                                DATE: 07/27/2024 Neuromuscular re-ed: Supine hooklying PF activation (elevator) + ball b/w knees Small range SLR + TA activation Bridges + PF activation Therapeutic  Activity: SKTC  Seated trunk flexion stretch with green PB roll out Wall push ups x10 --> staggered hands 2x8 Lunge squats to fatigue + UE support x 1  Self Care: Self-massage with rolling pin for quad tightness/tension                                                                                                                            PATIENT EDUCATION:  Education details: HEP review  Person educated: Patient Education method: Explanation,  Education comprehension: verbalized understanding,  HOME EXERCISE PROGRAM: Access Code: [email or web address] URL: [email or web address] Date: 08/01/2024 Prepared by: Lucie Meeter  Exercises - Romberg Stance  - 2 x daily - 7 x weekly - 3 sets - 30 sec  hold - Seated Long Arc Quad  - 2 x daily - 7 x weekly - 2 sets - 10 reps - Seated March with Resistance  - 2 x daily - 7 x weekly - 2 sets - 10 reps - Seated Hip Abduction with Resistance  - 2 x daily - 7 x weekly - 2 sets - 10 reps - Clamshell  - 2 x daily - 7 x weekly - 2 sets - 10 reps - Heel Raises with Counter Support  - 2 x daily - 7 x weekly - 2 sets - 10 reps - Toe Raises with Counter Support  - 2 x daily - 7 x weekly - 2 sets - 10 reps - Sit to Stand  - 1 x daily - 7 x weekly - 3 sets - 10 reps - Seated Hip Flexor Stretch  - 1 x daily - 7 x weekly - 1 sets - 3 reps - 30 sec hold - Standing Romberg to 3/4 Tandem Stance  - 1 x daily - 7 x weekly - 1 sets - 3 reps - 30 sec hold - Mini Squat  - 1 x daily - 7 x weekly - 3 sets - 10 reps - Supine Lower Trunk Rotation  - 1 x daily - 7 x weekly - 3 sets - 10 reps - Seated Flexion Stretch  - 1 x daily - 7 x weekly - 3 sets - 10 reps - Wall Push Up  - 1 x daily - 7 x weekly - 3 sets - 10 reps - Sidelying Hip Abduction  - 1 x daily - 7 x weekly - 2 sets - 10 reps  ASSESSMENT:  CLINICAL IMPRESSION: Patient reports another episode of weakness and sweating when shopping at [facility] yesterday (after about 45 minutes of walking). It was  recommended that she discuss these symptoms at upcoming visit with PCP next week. We continued with LE strengthening and completed floor transfers today with good tolerance. She did note back pain towards end of prescribed reps of sit to stand that resolved with short duration seated rest breaks. With floor transfer she is able to complete descent into half kneel with UE support without instability. With ascent it does take a lot of effort for her to complete transfer, requiring significant use of UE support from elbows to rise from half kneel and reports fatigue following this task.   EVAL: Patient is a 73 y.o. female who was seen today for physical therapy evaluation and treatment for other abnormalities of gait and mobility. Patient reports at least 1 year history of gait abnormalities and lower extremity weakness. She does note onset of low back pain around the same time she began to notice leg weakness and gait abnormalities. Upon assessment she is noted to have significant bilateral hip weakness and mild quadriceps weakness. She has gait abnormalities with limited foot clearance and decreased step length. She scores at an increased fall risk based upon TUG and BERG balance assessment. She will benefit from skilled PT to address the above stated deficits in order to optimize her function and reduce her risk of future falls.   OBJECTIVE IMPAIRMENTS: Abnormal gait, decreased activity tolerance, decreased balance,  decreased endurance, decreased knowledge of condition, difficulty walking, decreased strength, postural dysfunction, and pain.   ACTIVITY LIMITATIONS: carrying, lifting, bending, standing, squatting, stairs, transfers, and locomotion level  PARTICIPATION LIMITATIONS: meal prep, cleaning, laundry, shopping, community activity, and yard work  PERSONAL FACTORS: Age, Fitness, Time since onset of injury/illness/exacerbation, and 3+ comorbidities: see PMH above are also affecting patient's functional  outcome.   REHAB POTENTIAL: Good  CLINICAL DECISION MAKING: Evolving/moderate complexity  EVALUATION COMPLEXITY: Moderate   GOALS: Goals reviewed with patient? Yes  SHORT TERM GOALS: Target date: 07/13/2024  Patient will be independent and compliant with initial HEP.  Baseline: initial HEP issued Goal status: MET  2.  Patient will complete TUG in </= 12 seconds to reduce fall risk.  Baseline: see above Goal status: MET  3.  Patient will demonstrate at least 4/5 bilateral hip flexor and 3+/5 hip extensor strength to improve gait stability.   Baseline: see above  Goal status: MET   LONG TERM GOALS: Target date: 08/12/24  Patient will be independent with floor transfer.  Baseline: requires assistance from husband  Goal status: INITIAL  2.  Patient will be able to reach into bottom cabinets at home to pick up items.  Baseline: unable, requires husband to reach into bottom cabinets at home.  07/20/24: continues to require assist from husband Goal status: IN PROGRESS  3.  Patient will score >/= 45/56 on the BERG to reduce risk of falls.  Baseline: see above 07/20/24: 36/56 Goal status: IN PROGRESS  4.  Patient will score at least 50% confidence on the ABC balance scale.  Baseline: see above 07/20/24: 64.4% Goal status: MET  5.  Patient will be independent with advanced home program to progress/maintain current level of function.  Baseline: initial HEP issued  Goal status: INITIAL   PLAN:  PT FREQUENCY: 2x/week  PT DURATION: 8 weeks  PLANNED INTERVENTIONS: 97164- PT Re-evaluation, 97750- Physical Performance Testing, 97110-Therapeutic exercises, 97530- Therapeutic activity, [id number]- Neuromuscular re-education, 97535- Self Care, [id number]- Manual therapy, [id number]- Gait training, [phone] (1-2 muscles), [id number] (3+ muscles)- Dry Needling, Cryotherapy, and Moist heat  PLAN FOR NEXT SESSION: progress hip strengthening. Static & dynamic balance activities. Sit to stands. Gait training  working on increasing step length, reducing BOS, and improved clearance; work on step ups; glute med strengthening; floor transfers   Abu Heavin, PT, DPT, ATC 08/03/24 12:38 PM

## 2024-08-08 ENCOUNTER — Ambulatory Visit

## 2024-08-08 DIAGNOSIS — R2689 Other abnormalities of gait and mobility: Secondary | ICD-10-CM

## 2024-08-08 DIAGNOSIS — M6281 Muscle weakness (generalized): Secondary | ICD-10-CM

## 2024-08-08 DIAGNOSIS — Z9181 History of falling: Secondary | ICD-10-CM

## 2024-08-08 NOTE — Therapy (Signed)
 OUTPATIENT PHYSICAL THERAPY LOWER EXTREMITY TREATMENT     Patient Name: YASHIKA MASK MRN: [id number] DOB:February 23, 1951, 73 y.o., female Today's Date: 08/08/2024  END OF SESSION:  PT End of Session - 08/08/24 0927     Visit Number 15    Number of Visits 17    Date for PT Re-Evaluation 08/12/24    Authorization Type [employer]    Progress Note Due on Visit 20    PT Start Time 0928    PT Stop Time 1010    PT Time Calculation (min) 42 min    Activity Tolerance Patient tolerated treatment well    Behavior During Therapy WFL for tasks assessed/performed         Past Medical History:  Diagnosis Date   Arthritis    Cancer (HCC)    skin nose   GERD (gastroesophageal reflux disease)    Heart murmur    when pregnant   Hepatitis    75  from food   History of recurrent UTIs    on  prophalatic med   Hypertension    Pneumonia    hx   Past Surgical History:  Procedure Laterality Date   CESAREAN SECTION     COLON SURGERY     resection for diverticulitis   TOTAL HIP ARTHROPLASTY     TOTAL HIP ARTHROPLASTY Left 07/17/2014   Procedure: LEFT TOTAL HIP ARTHROPLASTY ANTERIOR APPROACH;  Surgeon: Lonni CINDERELLA Poli, MD;  Location: [facility] OR;  Service: Orthopedics;  Laterality: Left;   Patient Active Problem List   Diagnosis Date Noted   Arthritis of left hip 07/17/2014   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/17/2014   GERD (gastroesophageal reflux disease)    HYPERTENSION 01/30/2010   DIVERTICULOSIS-COLON 01/30/2010   DIVERTICULITIS OF COLON 01/27/2010   CHANGE IN BOWELS 01/21/2010   ABDOMINAL PAIN-LLQ 01/21/2010   ABDOMINAL PAIN, LOWER 01/21/2010   UTI'S, HX OF 01/21/2010   HYPERLIPIDEMIA 10/28/2007   GERD 10/28/2007   HIATAL HERNIA 10/28/2007   IRRITABLE BOWEL SYNDROME 10/28/2007   OSTEOARTHRITIS 10/28/2007   OSTEOARTHRITIS 10/28/2007   ARTHRITIS 10/28/2007   History of colonic polyps 10/28/2007    PCP: Morgan Drivers, MD  REFERRING PROVIDER: Katina Pfeiffer, PA-C   REFERRING DIAG: R26.89 (ICD-10-CM) - Other abnormalities of gait and mobility   THERAPY DIAG:  Muscle weakness (generalized)  Other abnormalities of gait and mobility  History of falling  Rationale for Evaluation and Treatment: Rehabilitation  ONSET DATE: July 2024   SUBJECTIVE:   SUBJECTIVE STATEMENT: Patient reports she had low back pain but not as bad as it has been over the weekend; states she has not had any episode sent her last trip to [place]. Patient states she was able to load dishwasher and did not need to sit down afterwards.   PERTINENT HISTORY: THA- bilateral  PAIN:  Are you having pain? 0/10 currently; 10/10 at worst low back pain; aggravated with prolonged standing/walking activity.   PRECAUTIONS: Fall  RED FLAGS: None   WEIGHT BEARING RESTRICTIONS: No  FALLS:  Has patient fallen in last 6 months? Yes. Number of falls 1, patient reports she had LOB and fell in the living room  LIVING ENVIRONMENT: Lives with: lives with their spouse Lives in: House/apartment Stairs: No Has following equipment at home: Single point cane and Environmental consultant - 4 wheeled  OCCUPATION: retired  PLOF: Independent  PATIENT GOALS: I want to be able to have more strength in my legs and walk normal.  NEXT MD  VISIT: 08/09/24  OBJECTIVE:  Note: Objective measures were completed at Evaluation unless otherwise noted.  PATIENT SURVEYS:  ABC scale: The Activities-Specific Balance Confidence (ABC) Scale 0% 10 20 30  40 50 60 70 80 90 100% No confidence<->completely confident  "How confident are you that you will not lose your balance or become unsteady when you . . .   Date tested 06/15/24  Walk around the house 100%  2. Walk up or down stairs 0%  3. Bend over and pick up a slipper from in front of a closet floor 10%  4. Reach for a small can off a shelf at eye level 100%  5. Stand on tip toes and reach for something above your head 10%  6. Stand on a chair and  reach for something 0%  7. Sweep the floor 50%  8. Walk outside the house to a car parked in the driveway 100%  9. Get into or out of a car 100%  10. Walk across a parking lot to the mall 20%  11. Walk up or down a ramp 20%  12. Walk in a crowded mall where people rapidly walk past you 100%  13. Are bumped into by people as you walk through the mall 0%  14. Step onto or off of an escalator while you are holding onto the railing 0%  15. Step onto or off an escalator while holding onto parcels such that you cannot hold onto the railing 0%  16. Walk outside on icy sidewalks 0%  Total: #/16  38 %     COGNITION: Overall cognitive status: Within functional limits for tasks assessed     SENSATION: Not tested    POSTURE: rounded shoulders and forward head   LOWER EXTREMITY MMT:  MMT Right eval Left eval 07/04/24 07/18/24 08/01/24  Hip flexion 3+ 3+ Rt: 3+; Lt: 4- Lt: 4+; Rt: 4+ Lt: 4+; Rt: 5  Hip extension 3- 3-  4 bilateral    Hip abduction 3+ 3+   Rt: 3+; Lt: 4-   Hip adduction       Hip internal rotation       Hip external rotation       Knee flexion 5 5   5  bilateral   Knee extension 4- 4- 4+ bilateral  5 bilateral   Ankle dorsiflexion 5 5   5  bilateral   Ankle plantarflexion 5 seated 5 seated   5 bilateral seated  Ankle inversion 5 5   5  bilateral  Ankle eversion 5 5   5  bilateral   (Blank rows = not tested)   FUNCTIONAL TESTS:  TUG: 15 seconds   07/18/24: 10.76 seconds    BERG BALANCE TEST Sitting to Standing: 4.      Stands without using hands and stabilize independently Standing Unsupported: 4.      Stands safely for 2 minutes Sitting Unsupported: 4.     Sits for 2 minutes independently Standing to Sitting: 4.     Sits safely with minimal use of hands Transfers: 4.     Transfers safely with minor use of hands Standing with eyes closed: 4.     Stands safely for 10 seconds  Standing with feet together: 3.     Stands for 1 minute with supervision Reaching forward  with outstretched arm: 3.     Reaches forward 5 inches Retrieving object from the floor: 4.      Able to pick up easily and safely Turning to look behind: 4.  Looks behind from both sides and weight shifts well Turning 360 degrees: 2.     Able to turn slowly, but safely Place alternate foot on stool: 3.     Completes 8 steps in >20 seconds Standing with one foot in front: 2.     Independent small step for 30 seconds Standing on one foot: 2.     Holds >/=3 seconds  Total Score: 36/56  GAIT: Distance walked: 20 ft  Assistive device utilized: None Level of assistance: Complete Independence Comments: limited foot clearance, small step length (not a true shuffling pattern, but more like a small march)   [facility] Adult PT Treatment:                                                DATE: 08/08/2024 Manual Therapy: STM/TPR (Rt) gluteal, piriformis Roller stick Rt glutes Neuromuscular re-ed: Side Lying: Back & over Bent knee hip abd + foam roller b/w legs Therapeutic Activity: Floor to standing transfers x 2 --> table for UE support (SBA) Adjusted QC for proper fit Gait training with QC & SPC --> recommended using SPC  Stair navigation with SPC & 1 railing    [facility] Adult PT Treatment:                                                DATE: 08/03/24 Therapeutic Exercise: Sidelying hip abduction 2 x 10  Seated HS curl 2 x 10 @ green band HEP review   Therapeutic Activity: Sit to stand lower height 2 x 10; no UE support  Floor transfers Walking 2 laps in clinic   Self Care: Recommended to discuss weakness and sweating, back pain, and discontinuing diuretics at upcoming PCP appointment  [facility] Adult PT Treatment:                                                DATE: 08/01/24 Therapeutic Exercise: LE MMT Seated trunk flexion x 10  Sidelying hip abduction x 10 each HEP update   Therapeutic Activity: Walking in [facility]- for walking in endurance  Walking 1 lap in clinic after trunk flexion   Step up 6 inch with single UE x 10 each  Step up 6 inch no UE support x 10 each   Self Care: Discuss medication with PCP at upcoming visit                                                                                                                            PATIENT EDUCATION:  Education details: HEP review  Person  educated: Patient Education method: Explanation,  Education comprehension: verbalized understanding,  HOME EXERCISE PROGRAM: Access Code: [email or web address] URL: [email or web address] Date: 08/01/2024 Prepared by: Lucie Meeter  Exercises - Romberg Stance  - 2 x daily - 7 x weekly - 3 sets - 30 sec  hold - Seated Long Arc Quad  - 2 x daily - 7 x weekly - 2 sets - 10 reps - Seated March with Resistance  - 2 x daily - 7 x weekly - 2 sets - 10 reps - Seated Hip Abduction with Resistance  - 2 x daily - 7 x weekly - 2 sets - 10 reps - Clamshell  - 2 x daily - 7 x weekly - 2 sets - 10 reps - Heel Raises with Counter Support  - 2 x daily - 7 x weekly - 2 sets - 10 reps - Toe Raises with Counter Support  - 2 x daily - 7 x weekly - 2 sets - 10 reps - Sit to Stand  - 1 x daily - 7 x weekly - 3 sets - 10 reps - Seated Hip Flexor Stretch  - 1 x daily - 7 x weekly - 1 sets - 3 reps - 30 sec hold - Standing Romberg to 3/4 Tandem Stance  - 1 x daily - 7 x weekly - 1 sets - 3 reps - 30 sec hold - Mini Squat  - 1 x daily - 7 x weekly - 3 sets - 10 reps - Supine Lower Trunk Rotation  - 1 x daily - 7 x weekly - 3 sets - 10 reps - Seated Flexion Stretch  - 1 x daily - 7 x weekly - 3 sets - 10 reps - Wall Push Up  - 1 x daily - 7 x weekly - 3 sets - 10 reps - Sidelying Hip Abduction  - 1 x daily - 7 x weekly - 2 sets - 10 reps  ASSESSMENT:  CLINICAL IMPRESSION: Floor to standing transfers continued; recommended patient use Lt LE as stabilizing leg with transfer from 1/2 kneel to standing. Patient brought in quad cane and height was adjusted for proper fit; gait training  completed with cueing for safe placement of AD. Recommended patient use SPC over quad cane due to easier navigation of AD and support need. Reviewed past symptoms and provided noted for upcoming doctor's visit.   EVAL: Patient is a 74 y.o. female who was seen today for physical therapy evaluation and treatment for other abnormalities of gait and mobility. Patient reports at least 1 year history of gait abnormalities and lower extremity weakness. She does note onset of low back pain around the same time she began to notice leg weakness and gait abnormalities. Upon assessment she is noted to have significant bilateral hip weakness and mild quadriceps weakness. She has gait abnormalities with limited foot clearance and decreased step length. She scores at an increased fall risk based upon TUG and BERG balance assessment. She will benefit from skilled PT to address the above stated deficits in order to optimize her function and reduce her risk of future falls.   OBJECTIVE IMPAIRMENTS: Abnormal gait, decreased activity tolerance, decreased balance, decreased endurance, decreased knowledge of condition, difficulty walking, decreased strength, postural dysfunction, and pain.   ACTIVITY LIMITATIONS: carrying, lifting, bending, standing, squatting, stairs, transfers, and locomotion level  PARTICIPATION LIMITATIONS: meal prep, cleaning, laundry, shopping, community activity, and yard work  PERSONAL FACTORS: Age, Fitness, Time since onset of injury/illness/exacerbation, and 3+ comorbidities: see PMH above  are also affecting patient's functional outcome.   REHAB POTENTIAL: Good  CLINICAL DECISION MAKING: Evolving/moderate complexity  EVALUATION COMPLEXITY: Moderate   GOALS: Goals reviewed with patient? Yes  SHORT TERM GOALS: Target date: 07/13/2024  Patient will be independent and compliant with initial HEP.  Baseline: initial HEP issued Goal status: MET  2.  Patient will complete TUG in </= 12  seconds to reduce fall risk.  Baseline: see above Goal status: MET  3.  Patient will demonstrate at least 4/5 bilateral hip flexor and 3+/5 hip extensor strength to improve gait stability.   Baseline: see above  Goal status: MET   LONG TERM GOALS: Target date: 08/12/24  Patient will be independent with floor transfer.  Baseline: requires assistance from husband  Goal status: INITIAL  2.  Patient will be able to reach into bottom cabinets at home to pick up items.  Baseline: unable, requires husband to reach into bottom cabinets at home.  07/20/24: continues to require assist from husband Goal status: IN PROGRESS  3.  Patient will score >/= 45/56 on the BERG to reduce risk of falls.  Baseline: see above 07/20/24: 36/56 Goal status: IN PROGRESS  4.  Patient will score at least 50% confidence on the ABC balance scale.  Baseline: see above 07/20/24: 64.4% Goal status: MET  5.  Patient will be independent with advanced home program to progress/maintain current level of function.  Baseline: initial HEP issued  Goal status: INITIAL   PLAN:  PT FREQUENCY: 2x/week  PT DURATION: 8 weeks  PLANNED INTERVENTIONS: 97164- PT Re-evaluation, 97750- Physical Performance Testing, 97110-Therapeutic exercises, 97530- Therapeutic activity, [id number]- Neuromuscular re-education, 97535- Self Care, [id number]- Manual therapy, [id number]- Gait training, [phone] (1-2 muscles), [id number] (3+ muscles)- Dry Needling, Cryotherapy, and Moist heat  PLAN FOR NEXT SESSION: progress hip strengthening. Static & dynamic balance activities. Sit to stands. Gait training working on increasing step length, reducing BOS, and improved clearance; work on step ups; glute med strengthening; floor transfers   Lamarr Price, PTA 08/08/24 10:18 AM

## 2024-08-09 DIAGNOSIS — R7303 Prediabetes: Secondary | ICD-10-CM | POA: Diagnosis not present

## 2024-08-09 DIAGNOSIS — M545 Low back pain, unspecified: Secondary | ICD-10-CM | POA: Diagnosis not present

## 2024-08-09 DIAGNOSIS — E559 Vitamin D deficiency, unspecified: Secondary | ICD-10-CM | POA: Diagnosis not present

## 2024-08-09 DIAGNOSIS — E7800 Pure hypercholesterolemia, unspecified: Secondary | ICD-10-CM | POA: Diagnosis not present

## 2024-08-09 DIAGNOSIS — I700 Atherosclerosis of aorta: Secondary | ICD-10-CM | POA: Diagnosis not present

## 2024-08-09 DIAGNOSIS — M47816 Spondylosis without myelopathy or radiculopathy, lumbar region: Secondary | ICD-10-CM | POA: Diagnosis not present

## 2024-08-09 DIAGNOSIS — R2689 Other abnormalities of gait and mobility: Secondary | ICD-10-CM | POA: Diagnosis not present

## 2024-08-09 DIAGNOSIS — Z6826 Body mass index (BMI) 26.0-26.9, adult: Secondary | ICD-10-CM | POA: Diagnosis not present

## 2024-08-09 DIAGNOSIS — I10 Essential (primary) hypertension: Secondary | ICD-10-CM | POA: Diagnosis not present

## 2024-08-09 DIAGNOSIS — F419 Anxiety disorder, unspecified: Secondary | ICD-10-CM | POA: Diagnosis not present

## 2024-08-09 DIAGNOSIS — I2510 Atherosclerotic heart disease of native coronary artery without angina pectoris: Secondary | ICD-10-CM | POA: Diagnosis not present

## 2024-08-09 DIAGNOSIS — M7989 Other specified soft tissue disorders: Secondary | ICD-10-CM | POA: Diagnosis not present

## 2024-08-09 DIAGNOSIS — Z720 Tobacco use: Secondary | ICD-10-CM | POA: Diagnosis not present

## 2024-08-10 ENCOUNTER — Ambulatory Visit

## 2024-08-10 DIAGNOSIS — Z9181 History of falling: Secondary | ICD-10-CM

## 2024-08-10 DIAGNOSIS — M6281 Muscle weakness (generalized): Secondary | ICD-10-CM

## 2024-08-10 DIAGNOSIS — R2689 Other abnormalities of gait and mobility: Secondary | ICD-10-CM

## 2024-08-10 NOTE — Therapy (Signed)
 OUTPATIENT PHYSICAL THERAPY LOWER EXTREMITY TREATMENT  PHYSICAL THERAPY DISCHARGE SUMMARY  Visits from Start of Care: 16  Current functional level related to goals / functional outcomes: See goals below   Remaining deficits: See impression below   Education / Equipment: See education below    Patient agrees to discharge. Patient goals were partially met. Patient is being discharged due to maximized rehab potential.     Patient Name: Jane [name] MRN: [id number] DOB:1951/05/25, 73 y.o., female Today's Date: 08/10/2024  END OF SESSION:  PT End of Session - 08/10/24 0934     Visit Number 16    Number of Visits 17    Date for Recertification  08/12/24    Authorization Type [employer]    Progress Note Due on Visit 20    PT Start Time 0934    PT Stop Time 1013    PT Time Calculation (min) 39 min    Activity Tolerance Patient tolerated treatment well    Behavior During Therapy WFL for tasks assessed/performed          Past Medical History:  Diagnosis Date   Arthritis    Cancer (HCC)    skin nose   GERD (gastroesophageal reflux disease)    Heart murmur    when pregnant   Hepatitis    73  from food   History of recurrent UTIs    on  prophalatic med   Hypertension    Pneumonia    hx   Past Surgical History:  Procedure Laterality Date   CESAREAN SECTION     COLON SURGERY     resection for diverticulitis   TOTAL HIP ARTHROPLASTY     TOTAL HIP ARTHROPLASTY Left 07/17/2014   Procedure: LEFT TOTAL HIP ARTHROPLASTY ANTERIOR APPROACH;  Surgeon: Lonni CINDERELLA Poli, MD;  Location: [facility] OR;  Service: Orthopedics;  Laterality: Left;   Patient Active Problem List   Diagnosis Date Noted   Arthritis of left hip 07/17/2014   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/17/2014   GERD (gastroesophageal reflux disease)    HYPERTENSION 01/30/2010   DIVERTICULOSIS-COLON 01/30/2010   DIVERTICULITIS OF COLON 01/27/2010   CHANGE IN BOWELS 01/21/2010   ABDOMINAL  PAIN-LLQ 01/21/2010   ABDOMINAL PAIN, LOWER 01/21/2010   UTI'S, HX OF 01/21/2010   HYPERLIPIDEMIA 10/28/2007   GERD 10/28/2007   HIATAL HERNIA 10/28/2007   IRRITABLE BOWEL SYNDROME 10/28/2007   OSTEOARTHRITIS 10/28/2007   OSTEOARTHRITIS 10/28/2007   ARTHRITIS 10/28/2007   History of colonic polyps 10/28/2007    PCP: Morgan Drivers, MD  REFERRING PROVIDER: Katina Pfeiffer, PA-C   REFERRING DIAG: R26.89 (ICD-10-CM) - Other abnormalities of gait and mobility   THERAPY DIAG:  Muscle weakness (generalized)  Other abnormalities of gait and mobility  History of falling  Rationale for Evaluation and Treatment: Rehabilitation  ONSET DATE: July 2024   SUBJECTIVE:   SUBJECTIVE STATEMENT: Patient had f/u with PCP and had a back X-ray and an MRI has been ordered. She discussed weakness, sweating, dizziness with PCP and the plan is to get the MRI first and then determine if other testing is needed. She walked around [facility] yesterday and was again feeling cold sweats by the end. Will continues to get back pain with prolonged standing/walking.   PERTINENT HISTORY: THA- bilateral  PAIN:  Are you having pain? 0/10 currently; 9/10 at worst low back pain; aggravated with prolonged standing/walking activity.   PRECAUTIONS: Fall  RED FLAGS: None   WEIGHT BEARING RESTRICTIONS: No  FALLS:  Has patient fallen in last 6 months? Yes. Number of falls 1, patient reports she had LOB and fell in the living room  LIVING ENVIRONMENT: Lives with: lives with their spouse Lives in: House/apartment Stairs: No Has following equipment at home: Single point cane and Environmental consultant - 4 wheeled  OCCUPATION: retired  PLOF: Independent  PATIENT GOALS: I want to be able to have more strength in my legs and walk normal.  NEXT MD VISIT: 08/09/24  OBJECTIVE:  Note: Objective measures were completed at Evaluation unless otherwise noted.  PATIENT SURVEYS:  ABC scale: The Activities-Specific Balance  Confidence (ABC) Scale 0% 10 20 30  40 50 60 70 80 90 100% No confidence<->completely confident  "How confident are you that you will not lose your balance or become unsteady when you . . .   Date tested 06/15/24  Walk around the house 100%  2. Walk up or down stairs 0%  3. Bend over and pick up a slipper from in front of a closet floor 10%  4. Reach for a small can off a shelf at eye level 100%  5. Stand on tip toes and reach for something above your head 10%  6. Stand on a chair and reach for something 0%  7. Sweep the floor 50%  8. Walk outside the house to a car parked in the driveway 100%  9. Get into or out of a car 100%  10. Walk across a parking lot to the mall 20%  11. Walk up or down a ramp 20%  12. Walk in a crowded mall where people rapidly walk past you 100%  13. Are bumped into by people as you walk through the mall 0%  14. Step onto or off of an escalator while you are holding onto the railing 0%  15. Step onto or off an escalator while holding onto parcels such that you cannot hold onto the railing 0%  16. Walk outside on icy sidewalks 0%  Total: #/16  38 %     COGNITION: Overall cognitive status: Within functional limits for tasks assessed     SENSATION: Not tested    POSTURE: rounded shoulders and forward head   LOWER EXTREMITY MMT:  MMT Right eval Left eval 07/04/24 07/18/24 08/01/24  Hip flexion 3+ 3+ Rt: 3+; Lt: 4- Lt: 4+; Rt: 4+ Lt: 4+; Rt: 5  Hip extension 3- 3-  4 bilateral    Hip abduction 3+ 3+   Rt: 3+; Lt: 4-   Hip adduction       Hip internal rotation       Hip external rotation       Knee flexion 5 5   5  bilateral   Knee extension 4- 4- 4+ bilateral  5 bilateral   Ankle dorsiflexion 5 5   5  bilateral   Ankle plantarflexion 5 seated 5 seated   5 bilateral seated  Ankle inversion 5 5   5  bilateral  Ankle eversion 5 5   5  bilateral   (Blank rows = not tested)   FUNCTIONAL TESTS:  TUG: 15 seconds   07/18/24: 10.76 seconds    BERG  BALANCE TEST Sitting to Standing: 4.      Stands without using hands and stabilize independently Standing Unsupported: 4.      Stands safely for 2 minutes Sitting Unsupported: 4.     Sits for 2 minutes independently Standing to Sitting: 4.     Sits safely with minimal use of hands Transfers:  4.     Transfers safely with minor use of hands Standing with eyes closed: 4.     Stands safely for 10 seconds  Standing with feet together: 3.     Stands for 1 minute with supervision Reaching forward with outstretched arm: 3.     Reaches forward 5 inches Retrieving object from the floor: 4.      Able to pick up easily and safely Turning to look behind: 4.     Looks behind from both sides and weight shifts well Turning 360 degrees: 2.     Able to turn slowly, but safely Place alternate foot on stool: 3.     Completes 8 steps in >20 seconds Standing with one foot in front: 2.     Independent small step for 30 seconds Standing on one foot: 2.     Holds >/=3 seconds  Total Score: 36/56  BERG BALANCE TEST 08/10/24 Sitting to Standing: 4.      Stands without using hands and stabilize independently Standing Unsupported: 4.      Stands safely for 2 minutes Sitting Unsupported: 4.     Sits for 2 minutes independently Standing to Sitting: 4.     Sits safely with minimal use of hands Transfers: 4.     Transfers safely with minor use of hands Standing with eyes closed: 4.     Stands safely for 10 seconds  Standing with feet together: 4.     Stands for 1 minute safely Reaching forward with outstretched arm: 3.     Reaches forward 5 inches Retrieving object from the floor: 4.      Able to pick up easily and safely Turning to look behind: 4.     Looks behind from both sides and weight shifts well Turning 360 degrees: 4.     Able to turn in </=4 seconds  Place alternate foot on stool: 4.     Completes 8 steps in 20 seconds     Standing with one foot in front: 3.     Independent foot ahead for 30 seconds Standing  on one foot: 3.     Holds 5-10 seconds  Total Score: 53/56  GAIT: Distance walked: 20 ft  Assistive device utilized: None Level of assistance: Complete Independence Comments: limited foot clearance, small step length (not a true shuffling pattern, but more like a small march)  [facility] Adult PT Treatment:                                                DATE: 08/10/24 Therapeutic Exercise: Reviewed and updated HEP discussing frequency, sets, reps, and ways to progress independently   Therapeutic Activity: Re-assessment to determine overall progress, educating patient on progress towards goals.   Self Care: Continue with work up ordered by PCP and f/u with her as needed to address ongoing symptoms.   [facility] Adult PT Treatment:                                                DATE: 08/08/2024 Manual Therapy: STM/TPR (Rt) gluteal, piriformis Roller stick Rt glutes Neuromuscular re-ed: Side Lying: Back & over Bent knee hip abd + foam roller b/w legs Therapeutic Activity: Floor  to standing transfers x 2 --> table for UE support (SBA) Adjusted QC for proper fit Gait training with QC & SPC --> recommended using SPC  Stair navigation with SPC & 1 railing    [facility] Adult PT Treatment:                                                DATE: 08/03/24 Therapeutic Exercise: Sidelying hip abduction 2 x 10  Seated HS curl 2 x 10 @ green band HEP review   Therapeutic Activity: Sit to stand lower height 2 x 10; no UE support  Floor transfers Walking 2 laps in clinic   Self Care: Recommended to discuss weakness and sweating, back pain, and discontinuing diuretics at upcoming PCP appointment  [facility] Adult PT Treatment:                                                DATE: 08/01/24 Therapeutic Exercise: LE MMT Seated trunk flexion x 10  Sidelying hip abduction x 10 each HEP update   Therapeutic Activity: Walking in [facility]- for walking in endurance  Walking 1 lap in clinic after trunk flexion   Step up 6 inch with single UE x 10 each  Step up 6 inch no UE support x 10 each   Self Care: Discuss medication with PCP at upcoming visit                                                                                                                            PATIENT EDUCATION:  Education details: see treatment  Person educated: Patient Education method: Explanation, handout  Education comprehension: verbalized understanding,  HOME EXERCISE PROGRAM: Access Code: [email or web address] URL: [email or web address] Date: 08/10/2024 Prepared by: Lucie Meeter  Exercises - Seated Flexion Stretch  - 1 x daily - 7 x weekly - 1 sets - 10 reps - Romberg Stance  - 1 x daily - 7 x weekly - 3 sets - 30 sec  hold - Seated Hip Flexor Stretch  - 1 x daily - 7 x weekly - 1 sets - 3 reps - 30 sec hold - Supine Lower Trunk Rotation  - 1 x daily - 7 x weekly - 3 sets - 10 reps - Standing Romberg to 3/4 Tandem Stance  - 1 x daily - 7 x weekly - 1 sets - 3 reps - 30 sec hold - Seated Long Arc Quad  - 1 x daily - 3 x weekly - 2 sets - 10 reps - Seated March with Resistance  - 1 x daily - 3 x weekly - 2 sets - 10 reps - Seated Hip Abduction with Resistance  - 1 x daily - 3  x weekly - 2 sets - 10 reps - Clamshell  - 1 x daily - 3 x weekly - 2 sets - 10 reps - Heel Raises with Counter Support  - 1 x daily - 3 x weekly - 2 sets - 10 reps - Toe Raises with Counter Support  - 1 x daily - 3 x weekly - 2 sets - 10 reps - Sit to Stand  - 1 x daily - 3 x weekly - 3 sets - 10 reps - Mini Squat  - 1 x daily - 3 x weekly - 3 sets - 10 reps - Wall Push Up  - 1 x daily - 3 x weekly - 3 sets - 10 reps - Sidelying Hip Abduction  - 1 x daily - 3 x weekly - 2 sets - 10 reps  ASSESSMENT:  CLINICAL IMPRESSION: Coline has progressed well in PT demonstrating overall improvements in balance, LE strength, and gait. Scores have significantly improved on the TUG and BERG scoring at a decreased fall risk compared to initial  evaluation. Her strength has significantly improved, though she continues to complain of weakness that comes about after prolonged walking/standing activity. She is independent with floor transfers with UE support. With functional task of reaching into lower cabinets she requires assistance to rise due to strength deficits when utilizing cabinet from raised height to assist in ascent. Though she has made objective improvement in PT she continues to report intermittent severe low back pain with standing/walking activity and has more recently developed symptoms of weakness, sweating, and dizziness with prolonged walking activity. These symptoms are being addressed by the referring provider as they were discussed at her f/u yesterday. She has an MRI ordered to further assess back pain/symptoms. She is independent with advanced home program and is appropriate for discharge at this time with recommendation to continue with testing and f/u with referring provider for further management/treatment of her symptoms with patient verbalizing understanding.   EVAL: Patient is a 73 y.o. female who was seen today for physical therapy evaluation and treatment for other abnormalities of gait and mobility. Patient reports at least 1 year history of gait abnormalities and lower extremity weakness. She does note onset of low back pain around the same time she began to notice leg weakness and gait abnormalities. Upon assessment she is noted to have significant bilateral hip weakness and mild quadriceps weakness. She has gait abnormalities with limited foot clearance and decreased step length. She scores at an increased fall risk based upon TUG and BERG balance assessment. She will benefit from skilled PT to address the above stated deficits in order to optimize her function and reduce her risk of future falls.   OBJECTIVE IMPAIRMENTS: Abnormal gait, decreased activity tolerance, decreased balance, decreased endurance, decreased  knowledge of condition, difficulty walking, decreased strength, postural dysfunction, and pain.   ACTIVITY LIMITATIONS: carrying, lifting, bending, standing, squatting, stairs, transfers, and locomotion level  PARTICIPATION LIMITATIONS: meal prep, cleaning, laundry, shopping, community activity, and yard work  PERSONAL FACTORS: Age, Fitness, Time since onset of injury/illness/exacerbation, and 3+ comorbidities: see PMH above are also affecting patient's functional outcome.   REHAB POTENTIAL: Good  CLINICAL DECISION MAKING: Evolving/moderate complexity  EVALUATION COMPLEXITY: Moderate   GOALS: Goals reviewed with patient? Yes  SHORT TERM GOALS: Target date: 07/13/2024  Patient will be independent and compliant with initial HEP.  Baseline: initial HEP issued Goal status: MET  2.  Patient will complete TUG in </= 12 seconds to reduce fall risk.  Baseline: see above Goal status: MET  3.  Patient will demonstrate at least 4/5 bilateral hip flexor and 3+/5 hip extensor strength to improve gait stability.   Baseline: see above  Goal status: MET   LONG TERM GOALS: Target date: 08/12/24  Patient will be independent with floor transfer.  Baseline: requires assistance from husband  08/10/24: able to complete independently with use of UE support  Goal status: MET  2.  Patient will be able to reach into bottom cabinets at home to pick up items.  Baseline: unable, requires husband to reach into bottom cabinets at home.  07/20/24: continues to require assist from husband 08/10/24: Min A needed to rise  Goal status:not met   3.  Patient will score >/= 45/56 on the BERG to reduce risk of falls.  Baseline: see above 07/20/24: 36/56 Goal status: MET  4.  Patient will score at least 50% confidence on the ABC balance scale.  Baseline: see above 07/20/24: 64.4% Goal status: MET  5.  Patient will be independent with advanced home program to progress/maintain current level of function.   Baseline: initial HEP issued  Goal status: MET   PLAN:  PT FREQUENCY: 2x/week  PT DURATION: 8 weeks  PLANNED INTERVENTIONS: 97164- PT Re-evaluation, 97750- Physical Performance Testing, 97110-Therapeutic exercises, 97530- Therapeutic activity, [id number]- Neuromuscular re-education, 97535- Self Care, [id number]- Manual therapy, [id number]- Gait training, [phone] (1-2 muscles), [id number] (3+ muscles)- Dry Needling, Cryotherapy, and Moist heat    Lucie Meeter, PT, DPT, ATC 08/10/24 10:37 AM

## 2024-08-14 ENCOUNTER — Other Ambulatory Visit: Payer: Self-pay | Admitting: Family Medicine

## 2024-08-14 DIAGNOSIS — R2689 Other abnormalities of gait and mobility: Secondary | ICD-10-CM

## 2024-08-26 ENCOUNTER — Ambulatory Visit
Admission: RE | Admit: 2024-08-26 | Discharge: 2024-08-26 | Disposition: A | Source: Ambulatory Visit | Attending: Family Medicine | Admitting: Family Medicine

## 2024-08-26 DIAGNOSIS — M47816 Spondylosis without myelopathy or radiculopathy, lumbar region: Secondary | ICD-10-CM | POA: Diagnosis not present

## 2024-08-26 DIAGNOSIS — M5126 Other intervertebral disc displacement, lumbar region: Secondary | ICD-10-CM | POA: Diagnosis not present

## 2024-08-26 DIAGNOSIS — R2689 Other abnormalities of gait and mobility: Secondary | ICD-10-CM

## 2024-10-11 ENCOUNTER — Ambulatory Visit: Admitting: Orthopedic Surgery

## 2024-10-11 ENCOUNTER — Other Ambulatory Visit (INDEPENDENT_AMBULATORY_CARE_PROVIDER_SITE_OTHER): Payer: Self-pay

## 2024-10-11 VITALS — BP 117/78 | HR 105 | Ht 62.0 in | Wt 145.8 lb

## 2024-10-11 DIAGNOSIS — M545 Low back pain, unspecified: Secondary | ICD-10-CM | POA: Diagnosis not present

## 2024-10-11 MED ORDER — METHOCARBAMOL 500 MG PO TABS: 500.0000 mg | ORAL_TABLET | Freq: Four times a day (QID) | ORAL | 0 refills | Status: AC | PRN

## 2024-10-11 NOTE — Progress Notes (Signed)
 Orthopedic Spine Surgery Office Note  Assessment: Patient is a 73 y.o. female with chronic, progressive low back pain. No radicular symptoms   Plan: -Explained that initially conservative treatment is tried as a significant number of patients may experience relief with these treatment modalities. Discussed that the conservative treatments include:  -activity modification  -physical therapy  -over the counter pain medications  -medrol  dosepak  -lumbar steroid injections -Patient has tried PT, ibuprofen -Recommended continue with a home exercise program.  Discussed heat in the mornings along with light stretching.  Can use lidocaine  patches.  Prescribed methocarbamol  to use as well -Discussed L2-3 ESI as an another nonoperative treatment try if the above does not work -Would need to be nicotine free prior to any elective spine surgery -Patient should return to office on an as-needed basis   Patient expressed understanding of the plan and all questions were answered to the patient's satisfaction.   ___________________________________________________________________________   History:  Patient is a 73 y.o. female who presents today for lumbar spine.  Patient has had over a year of back pain that has gotten progressively worse with time.  She has no pain radiating into either lower extremity.  She feels in the lumbar region.  There is no trauma or injury that preceded the onset of the pain.  She notes the pain is worse with activity.  She says she now has difficult time going to the store as pain will set in if she is there too long.  She then has to go home and sit down.  Pain gets better if she sits down to rest.   Weakness: Denies Symptoms of imbalance: Denies Paresthesias and numbness: Denies Bowel or bladder incontinence: Has a history of urinary incontinence.  No recent changes in bowel or bladder habits. Saddle anesthesia: Denies  Treatments tried: PT, ibuprofen  Review of  systems: Denies fevers and chills, night sweats, unexplained weight loss, history of cancer, pain that wakes them at night  Past medical history: GERD HTN  Allergies: codeine   Past surgical history:  Bilateral THA C section  Social history: Reports use of nicotine product (smoking, vaping, patches, smokeless) Alcohol use: Yes, one drink per day Denies recreational drug use   Physical Exam:  BMI of 26.7  General: no acute distress, appears stated age Neurologic: alert, answering questions appropriately, following commands Respiratory: unlabored breathing on room air, symmetric chest rise Psychiatric: appropriate affect, normal cadence to speech   MSK (spine):  -Strength exam      Left  Right EHL    5/5  5/5 TA    5/5  5/5 GSC    5/5  5/5 Knee extension  5/5  5/5 Hip flexion   5/5  5/5  -Sensory exam    Sensation intact to light touch in L3-S1 nerve distributions of bilateral lower extremities  -Achilles DTR: 2/4 on the left, 2/4 on the right -Patellar tendon DTR: 2/4 on the left, 2/4 on the right  -Straight leg raise: Negative bilaterally -Clonus: no beats bilaterally  -Left hip exam: No pain through range of motion -Right hip exam: No pain through range of motion  Imaging: XRs of the lumbar spine from 10/11/2024 were independently reviewed and interpreted, showing disc height loss at L3/4 and L4/5 with small anterior osteophyte formation.  No evidence of instability on flexion/tension views.  No fracture or dislocation seen.  MRI of the lumbar spine from 08/26/2024 was independently reviewed and interpreted, showing small disc bulge and DDD at L2/3.  No other significant DDD.  No central, lateral recess, or foraminal stenosis seen.   Patient name: Jane [name] Patient MRN: [id number] Date of visit: 10/11/24

## 2024-11-02 ENCOUNTER — Other Ambulatory Visit

## 2024-11-02 ENCOUNTER — Telehealth: Payer: Self-pay | Admitting: Orthopedic Surgery

## 2024-11-02 DIAGNOSIS — M545 Low back pain, unspecified: Secondary | ICD-10-CM

## 2024-11-02 NOTE — Addendum Note (Signed)
 Addended by: GEORGINA SHARPER on: 11/02/2024 09:38 AM   Modules accepted: Orders

## 2024-11-02 NOTE — Telephone Encounter (Signed)
 Pt called and said that her back is still hurting and she said that Georgina told her that they will to plan B with an injection for the back. CB#[phone]

## 2024-11-02 NOTE — Telephone Encounter (Signed)
 I called and advised.

## 2024-11-14 NOTE — Discharge Instructions (Signed)

## 2024-11-17 ENCOUNTER — Ambulatory Visit
Admission: RE | Admit: 2024-11-17 | Discharge: 2024-11-17 | Disposition: A | Discharge status: Home / Self Care | Source: Ambulatory Visit | Attending: Orthopedic Surgery | Admitting: Orthopedic Surgery

## 2024-11-17 DIAGNOSIS — M545 Low back pain, unspecified: Secondary | ICD-10-CM

## 2024-11-17 MED ORDER — IOPAMIDOL (ISOVUE-M 300) INJECTION 61%
1.0000 mL | Freq: Once | INTRAMUSCULAR | Status: AC | PRN
  Administered 2024-11-17: 1 mL via EPIDURAL

## 2024-11-17 MED ORDER — METHYLPREDNISOLONE ACETATE 40 MG/ML INJ SUSP (RADIOLOG
80.0000 mg | Freq: Once | INTRAMUSCULAR | Status: AC
  Administered 2024-11-17: 80 mg via EPIDURAL

## 2024-12-20 ENCOUNTER — Telehealth: Payer: Self-pay | Admitting: Orthopedic Surgery

## 2024-12-20 DIAGNOSIS — M5416 Radiculopathy, lumbar region: Secondary | ICD-10-CM

## 2024-12-20 NOTE — Telephone Encounter (Signed)
I called and advised her of Dr. Georgia Duff message

## 2024-12-20 NOTE — Telephone Encounter (Signed)
 Pt called saying that she wants another back injection. Call back number is [phone].

## 2024-12-29 ENCOUNTER — Telehealth: Payer: Self-pay | Admitting: Orthopedic Surgery

## 2024-12-29 NOTE — Telephone Encounter (Signed)
 Patient wants to know what options do she have to help for pain if she can not get another injection in her back until March. Pt states the patches are not working and the pain is so severe.

## 2025-01-19 ENCOUNTER — Other Ambulatory Visit
